# Patient Record
Sex: Female | Born: 1980 | Race: White | Hispanic: No | Marital: Married | Smoking: Never smoker
Health system: Southern US, Community
[De-identification: ages and names within clinical notes are randomized; demographics above are authoritative.]

## PROBLEM LIST (undated history)

## (undated) ENCOUNTER — Inpatient Hospital Stay (HOSPITAL_COMMUNITY): Payer: Self-pay

## (undated) DIAGNOSIS — T7840XA Allergy, unspecified, initial encounter: Secondary | ICD-10-CM

## (undated) HISTORY — PX: SKIN CANCER EXCISION: SHX779

## (undated) HISTORY — DX: Allergy, unspecified, initial encounter: T78.40XA

---

## 2002-03-04 ENCOUNTER — Other Ambulatory Visit: Admission: RE | Admit: 2002-03-04 | Discharge: 2002-03-04 | Payer: Self-pay | Admitting: Obstetrics and Gynecology

## 2002-07-04 ENCOUNTER — Encounter: Payer: Self-pay | Admitting: Obstetrics and Gynecology

## 2002-07-04 ENCOUNTER — Ambulatory Visit (HOSPITAL_COMMUNITY): Admission: RE | Admit: 2002-07-04 | Discharge: 2002-07-04 | Payer: Self-pay | Admitting: Obstetrics and Gynecology

## 2002-08-21 ENCOUNTER — Other Ambulatory Visit: Admission: RE | Admit: 2002-08-21 | Discharge: 2002-08-21 | Payer: Self-pay | Admitting: Obstetrics and Gynecology

## 2002-11-21 ENCOUNTER — Inpatient Hospital Stay (HOSPITAL_COMMUNITY): Admission: AD | Admit: 2002-11-21 | Discharge: 2002-11-24 | Payer: Self-pay | Admitting: Obstetrics and Gynecology

## 2003-12-27 DIAGNOSIS — C4370 Malignant melanoma of unspecified lower limb, including hip: Secondary | ICD-10-CM | POA: Insufficient documentation

## 2004-01-15 ENCOUNTER — Other Ambulatory Visit: Admission: RE | Admit: 2004-01-15 | Discharge: 2004-01-15 | Payer: Self-pay | Admitting: Obstetrics and Gynecology

## 2004-08-20 ENCOUNTER — Other Ambulatory Visit: Admission: RE | Admit: 2004-08-20 | Discharge: 2004-08-20 | Payer: Self-pay | Admitting: Obstetrics and Gynecology

## 2004-11-24 ENCOUNTER — Ambulatory Visit (HOSPITAL_BASED_OUTPATIENT_CLINIC_OR_DEPARTMENT_OTHER): Admission: RE | Admit: 2004-11-24 | Discharge: 2004-11-24 | Payer: Self-pay | Admitting: General Surgery

## 2004-11-24 ENCOUNTER — Encounter (INDEPENDENT_AMBULATORY_CARE_PROVIDER_SITE_OTHER): Payer: Self-pay | Admitting: *Deleted

## 2004-11-24 ENCOUNTER — Ambulatory Visit (HOSPITAL_COMMUNITY): Admission: RE | Admit: 2004-11-24 | Discharge: 2004-11-24 | Payer: Self-pay | Admitting: General Surgery

## 2005-02-15 ENCOUNTER — Other Ambulatory Visit: Admission: RE | Admit: 2005-02-15 | Discharge: 2005-02-15 | Payer: Self-pay | Admitting: Obstetrics and Gynecology

## 2005-06-23 ENCOUNTER — Encounter: Admission: RE | Admit: 2005-06-23 | Discharge: 2005-08-24 | Payer: Self-pay | Admitting: Obstetrics and Gynecology

## 2005-08-18 ENCOUNTER — Ambulatory Visit: Payer: Self-pay | Admitting: *Deleted

## 2005-08-23 ENCOUNTER — Inpatient Hospital Stay (HOSPITAL_COMMUNITY): Admission: AD | Admit: 2005-08-23 | Discharge: 2005-08-26 | Payer: Self-pay | Admitting: Obstetrics and Gynecology

## 2005-10-21 ENCOUNTER — Emergency Department (HOSPITAL_COMMUNITY): Admission: EM | Admit: 2005-10-21 | Discharge: 2005-10-21 | Payer: Self-pay | Admitting: Family Medicine

## 2006-05-25 ENCOUNTER — Other Ambulatory Visit: Admission: RE | Admit: 2006-05-25 | Discharge: 2006-05-25 | Payer: Self-pay | Admitting: Obstetrics and Gynecology

## 2007-05-27 LAB — CONVERTED CEMR LAB: Pap Smear: NORMAL

## 2007-12-24 ENCOUNTER — Inpatient Hospital Stay (HOSPITAL_COMMUNITY): Admission: AD | Admit: 2007-12-24 | Discharge: 2007-12-24 | Payer: Self-pay | Admitting: Obstetrics and Gynecology

## 2008-01-10 ENCOUNTER — Emergency Department (HOSPITAL_COMMUNITY): Admission: EM | Admit: 2008-01-10 | Discharge: 2008-01-10 | Payer: Self-pay | Admitting: Family Medicine

## 2008-05-01 ENCOUNTER — Inpatient Hospital Stay (HOSPITAL_COMMUNITY): Admission: AD | Admit: 2008-05-01 | Discharge: 2008-05-03 | Payer: Self-pay | Admitting: Obstetrics and Gynecology

## 2008-05-27 ENCOUNTER — Ambulatory Visit: Payer: Self-pay | Admitting: Family Medicine

## 2008-05-27 DIAGNOSIS — G43009 Migraine without aura, not intractable, without status migrainosus: Secondary | ICD-10-CM | POA: Insufficient documentation

## 2008-05-27 DIAGNOSIS — Z8742 Personal history of other diseases of the female genital tract: Secondary | ICD-10-CM

## 2008-05-27 DIAGNOSIS — J309 Allergic rhinitis, unspecified: Secondary | ICD-10-CM | POA: Insufficient documentation

## 2008-06-09 ENCOUNTER — Ambulatory Visit (HOSPITAL_COMMUNITY): Admission: RE | Admit: 2008-06-09 | Discharge: 2008-06-09 | Payer: Self-pay | Admitting: Family Medicine

## 2008-07-01 ENCOUNTER — Encounter (INDEPENDENT_AMBULATORY_CARE_PROVIDER_SITE_OTHER): Payer: Self-pay | Admitting: *Deleted

## 2008-10-21 ENCOUNTER — Ambulatory Visit: Payer: Self-pay | Admitting: Family Medicine

## 2008-10-21 LAB — CONVERTED CEMR LAB: Rapid Strep: NEGATIVE

## 2008-10-27 ENCOUNTER — Ambulatory Visit: Payer: Self-pay | Admitting: Family Medicine

## 2009-01-12 ENCOUNTER — Ambulatory Visit: Payer: Self-pay | Admitting: Family Medicine

## 2009-03-11 ENCOUNTER — Ambulatory Visit: Payer: Self-pay | Admitting: Family Medicine

## 2009-11-25 LAB — CONVERTED CEMR LAB
Pap Smear: NORMAL
Pap Smear: NORMAL
Pap Smear: NORMAL
Pap Smear: NORMAL
Pap Smear: NORMAL
Pap Smear: NORMAL

## 2010-09-07 ENCOUNTER — Telehealth (INDEPENDENT_AMBULATORY_CARE_PROVIDER_SITE_OTHER): Payer: Self-pay | Admitting: *Deleted

## 2010-09-08 ENCOUNTER — Ambulatory Visit: Payer: Self-pay | Admitting: Family Medicine

## 2010-09-08 LAB — CONVERTED CEMR LAB
ALT: 32 units/L (ref 0–35)
AST: 25 units/L (ref 0–37)
Albumin: 4.2 g/dL (ref 3.5–5.2)
Alkaline Phosphatase: 43 units/L (ref 39–117)
BUN: 12 mg/dL (ref 6–23)
Bilirubin, Direct: 0.1 mg/dL (ref 0.0–0.3)
CO2: 30 meq/L (ref 19–32)
Calcium: 9.7 mg/dL (ref 8.4–10.5)
Chloride: 107 meq/L (ref 96–112)
Cholesterol: 152 mg/dL (ref 0–200)
Creatinine, Ser: 0.8 mg/dL (ref 0.4–1.2)
GFR calc non Af Amer: 95.14 mL/min (ref >60)
Glucose, Bld: 88 mg/dL (ref 70–99)
HDL: 47.1 mg/dL (ref >39.00)
LDL Cholesterol: 92 mg/dL (ref 0–99)
Potassium: 4.3 meq/L (ref 3.5–5.1)
Sodium: 141 meq/L (ref 135–145)
Total Bilirubin: 0.8 mg/dL (ref 0.3–1.2)
Total CHOL/HDL Ratio: 3
Total Protein: 7.1 g/dL (ref 6.0–8.3)
Triglycerides: 66 mg/dL (ref 0.0–149.0)
VLDL: 13.2 mg/dL (ref 0.0–40.0)

## 2010-09-14 ENCOUNTER — Ambulatory Visit: Payer: Self-pay | Admitting: Family Medicine

## 2010-09-14 ENCOUNTER — Encounter: Payer: Self-pay | Admitting: Family Medicine

## 2010-09-14 DIAGNOSIS — G5600 Carpal tunnel syndrome, unspecified upper limb: Secondary | ICD-10-CM

## 2010-09-14 DIAGNOSIS — M6740 Ganglion, unspecified site: Secondary | ICD-10-CM | POA: Insufficient documentation

## 2010-10-20 ENCOUNTER — Encounter (INDEPENDENT_AMBULATORY_CARE_PROVIDER_SITE_OTHER): Payer: Self-pay | Admitting: Obstetrics and Gynecology

## 2010-10-20 ENCOUNTER — Ambulatory Visit: Payer: Self-pay | Admitting: Vascular Surgery

## 2010-10-20 ENCOUNTER — Ambulatory Visit: Admission: RE | Admit: 2010-10-20 | Discharge: 2010-10-20 | Payer: Self-pay | Admitting: Obstetrics and Gynecology

## 2011-01-25 NOTE — Progress Notes (Signed)
----   Converted from flag ---- ---- 09/07/2010 9:55 AM, Kerby Nora MD wrote: CMEt, lipoids Dx v77.91  ---- 09/06/2010 10:56 AM, Liane Comber CMA (AAMA) wrote: Lab orders please! Good Morning! This pt is scheduled for cpx labs Wed, which labs to draw and dx codes to use? Thanks Tasha ------------------------------

## 2011-01-25 NOTE — Assessment & Plan Note (Signed)
Summary: cpx/alc   Vital Signs:  Patient profile:   30 year old female Height:      64 inches Weight:      137.0 pounds BMI:     23.60 Temp:     98.8 degrees F oral Pulse rate:   72 / minute Pulse rhythm:   regular BP sitting:   106 / 72  (left arm) Cuff size:   regular  Vitals Entered By: Benny Lennert CMA Duncan Dull) (September 14, 2010 11:34 AM)  History of Present Illness: Chief complaint chronic health issue maintanance  Sees GYN for breast and pelvic exam.    Continued headache. Occuring 4-5 days a week.  Nausea ., light sensitivity.  Has to lay down.  Worsening in past month and a half.  Pain, pressure, squeezing pain, on right side..blurred in right eye. Using 600 mg usually 1-2 times a day.   No clear known trigger. No caffeine intake.   MRI nml 2009.   Ganglion cyst left posterior wrist...occ weakness in left hand. Limited bending. Intermittant pain. Though may have carpal tunnel in past but never treatmed/.  Has history of fracture left radius when young.  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 Take 1 Tablet By Mouth Once A Day 2)  Ibuprofen 600 Mg Tabs (Ibuprofen) .... As Needed  Allergies: 1)  ! Vicks Dayquil Cough (Dextromethorphan Hbr)  Past History:  Past medical, surgical, family and social histories (including risk factors) reviewed, and no changes noted (except as noted below).  Past Medical History: Reviewed history from 05/27/2008 and no changes required. Asthma, childhood Allergic rhinitis  Past Surgical  History: Reviewed history from 05/27/2008 and no changes required. 2005 removal of melanoma, stage 1 NSVD x 3  Family History: Reviewed history from 05/27/2008 and no changes required. father: fibromyalgia, autoimmune disease mother: healthy siblings: healthy PGM: heart disease, DM, MVP MGM: stomache cancer, DM PGF: alcoholic, lung cancer  Social History: Reviewed history from 05/27/2008 and no changes required. 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 CV:  Denies chest pain or discomfort. Resp:  Complains of shortness of breath; denies cough, sputum productive, and wheezing; Some SOB... worse during allergy season, worse at night. ..uses zyrtec for allergies. uses kids albuterol...about one a night.  Has history of asthma as child. Marland Kitchen GI:  Denies abdominal pain. GU:  Denies dysuria. Psych:  Denies anxiety and depression; a lot of stress.  Physical Exam  General:  Well-developed,well-nourished,in no acute distress; alert,appropriate and cooperative throughout examination, nontoxic. Head:  Normocephalic and atraumatic without obvious abnormalities. No apparent alopecia or balding. Eyes:  No corneal or conjunctival inflammation noted. EOMI. Perrla. Funduscopic exam benign, without hemorrhages, exudates or papilledema. Vision grossly normal. Ears:  External ear exam shows no significant lesions or deformities.  Otoscopic examination reveals clear canals, tympanic membranes are intact bilaterally without bulging, retraction, inflammation or discharge. Hearing is grossly normal bilaterally. Nose:  External nasal examination shows no deformity or inflammation. Nasal mucosa are  pink and moist without lesions or exudates. Mouth:  Some redness and inflammation. 3+ tonsils with crypts...discussed. Neck:  no carotid bruit or thyromegaly no cervical or  supraclavicular lymphadenopathy  Breasts:  per GYN Lungs:  Normal respiratory effort, chest expands symmetrically. Lungs are clear to auscultation, no crackles or wheezes. Heart:  Normal rate and regular rhythm. S1 and S2 normal without gallop, murmur, click, rub or other extra sounds. Abdomen:  Bowel sounds positive,abdomen soft and non-tender without masses, organomegaly or hernias noted. Genitalia:  per gYN Msk:  ganglion cyst on left dorsal wrist ttp in radial half on hand and numbness triggered by Phalen's and Tinel's  Pulses:  R and L posterior tibial pulses are full and equal bilaterally  Extremities:  no edema  Neurologic:  No cranial nerve deficits noted. Station and gait are normal. DTRs are symmetrical throughout. Sensory, motor and coordinative functions appear intact. Skin:  Intact without suspicious lesions or rashes Psych:  Cognition and judgment appear intact. Alert and cooperative with normal attention span and concentration. No apparent delusions, illusions, hallucinations   Impression & Recommendations:  Problem # 1:  MIGRAINE, COMMON (ICD-346.10) Stop OTC meds.  Eliminate caffeine. Increase exercsie regualrly. Sleep 8 hours at night.  Look at migraine triggers...keep headache diary. bring to next appt.  Use sumatriptan for severe migraine.  The following medications were removed from the medication list:    Diclofenac Sodium 75 Mg Tbec (Diclofenac sodium) .Marland Kitchen... 1 tab by mouth two times a day only as needed Her updated medication list for this problem includes:    Sumatriptan Succinate 100 Mg Tabs (Sumatriptan succinate) .Marland Kitchen... 1 tab by mouth x 1 then may repeat in 2 hours if not resolved.  max 200 mg in 24 hours.  Problem # 2:  ? of ASTHMA (ICD-493.90) Normal spirometry..? due more to allergies being poor control...start singulair.  Follow up i 1 month.  Her updated medication list for this problem includes:    Singulair 10 Mg Tabs (Montelukast sodium) .Marland Kitchen... 1 tab  by mouth daily  Orders: Spirometry w/Graph (94010)  Problem # 3:  ALLERGIC RHINITIS (ICD-477.9) Treat as in #2  Problem # 4:  CARPAL TUNNEL SYNDROME, LEFT (ICD-354.0) NSAIDs, wear brace B. Info given. Orders: Splints- All Types (Z6109)  Problem # 5:  GANGLION CYST, WRIST, LEFT (ICD-727.41) Pt reassured. Follow.  Complete Medication List: 1)  Multivitamins Tabs (Multiple vitamin) .... Take 1 tablet by mouth once a day 2)  Singulair 10 Mg Tabs (Montelukast sodium) .Marland Kitchen.. 1 tab by mouth daily 3)  Sumatriptan Succinate 100 Mg Tabs (Sumatriptan succinate) .Marland Kitchen.. 1 tab by mouth x 1 then may repeat in 2 hours if not resolved.  max 200 mg in 24 hours.  Patient Instructions: 1)  Stop OTC meds. 2)   Eliminate caffeine. 3)  Increase exercsie regualrly. 4)  Sleep 8 hours at night. 5)   Look at migraine triggers...keep headache diary. bring to next appt.  6)  Use sumatriptan for severe migraine.  7)  Start singulair for allergies/asthma. 8)  Limit albuterol use. 9)  Please schedule a follow-up appointment in 1 month 30 min appt. 10)  Start wearing carpal tunnel brace on left wrist during the day.  Prescriptions: SUMATRIPTAN SUCCINATE 100 MG TABS (SUMATRIPTAN SUCCINATE) 1 tab by mouth x 1 then may repeat in 2 hours if not resolved.  MAx 200 mg in 24 hours.  #9 x 0   Entered and Authorized by:   Kerby Nora MD   Signed by:  Kerby Nora MD on 09/14/2010   Method used:   [facility] to        [facility] [id number] (retail[facility]       [address]       [place], [place]  [address]       Ph: [phone] or [phone]       Fax: [phone]   RxID:   [id number] SINGULAIR 10 MG TABS (MONTELUKAST SODIUM) 1 tab by mouth daily  #30 x 5   Entered and Authorized by:   Kerby Nora MD   Signed by:   Kerby Nora MD on 09/14/2010   Method used:   [facility]        [facility]  [address] (retail)       [address]       [place], [place]  [address]       Ph: [phone]  or [phone]       Fax: [phone]   RxID:   [id number]   Current Allergies (reviewed today): ! VICKS DAYQUIL COUGH (DEXTROMETHORPHAN HBR) Flu Vaccine Result Date:  09/14/2010 Flu Vaccine Result:  will get at work Flu Vaccine Next Due:  1 yr Last PAP:  Normal (05/27/2007 10:31:54 AM) PAP Result Date:  11/25/2009 PAP Result:  normal PAP Next Due:  1 yr

## 2011-02-16 ENCOUNTER — Ambulatory Visit: Payer: Self-pay | Admitting: Family Medicine

## 2011-05-10 NOTE — Discharge Summary (Signed)
NAMESHIZUE, KASEMAN                ACCOUNT NO.:  [id number]   MEDICAL RECORD NO.:  [id number]          PATIENT TYPE:  INP   LOCATION:  9111                          FACILITY:  [facility]   PHYSICIAN:  Malachi Pro. Ambrose Mantle, M.D. DATE OF BIRTH:  Mar 02, 1981   DATE OF ADMISSION:  05/01/2008  DATE OF DISCHARGE:  05/03/2008                               DISCHARGE SUMMARY   A 30 year old white female, para 2-0-1-2, gravida 4, 38 plus weeks by  last period compatible with an 8-week ultrasound with [facility] May 13, 2008,  presented to the office complaining of irregular contractions, increased  pressure, good fetal movement, and no ruptured membranes.  Cervix there  was 3 cm, 60%.  Blood group and type A positive, negative antibody, RPR  nonreactive, rubella immune, hepatitis B surface antigen negative, HIV  negative, GC and Chlamydia negative, cystic fibrosis negative, and  Glucola elevated x2.  A 3-hour GTT normal x2.  Group B strep positive.  Prenatal care, reflux treated with over-the-counter Zantac, otherwise  essentially uncomplicated.   OBSTETRICAL HISTORY:  Spontaneous vaginal delivery at term x2, 7 pounds  6 ounces and 7 pounds 13 ounces, history of gestational diabetes  mellitus.  She also had a spontaneous abortion.   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   PAST MEDICAL HISTORY:  Mild asthma.   SURGICAL HISTORY:  Melanoma of the right calf.   ALLERGIES:  VICKS causes a rash.   MEDICATIONS:  None.   The patient on admission was afebrile with normal vital signs.  HEART/LUNGS:  Normal.  ABDOMEN:  Soft and gravid.  Cervix 3 cm, 60%, vertex at -2.   The patient was started on penicillin for group B strep and Pitocin.  Artificial rupture of membranes was done for augmentation.  At 5:50  p.m., the cervix was 8 cm, 90%.  She progressed to complete dilatation,  pushed well, and delivered a 7 pounds 7 ounces female infant without  Apgars of 9 at 1 and 9 at 5 minutes by Dr. Jackelyn Knife.   Over an intact  perineum, a vaginal cyst at 10-11 o'clock was drained.  Blood loss was  less than 500 mL.  Postpartum, the patient did well, discharged on the  second postpartum day.  Initial hemoglobin was 12.1, hematocrit 35,  white count 15,000, and platelet count 177,000.  RPR was nonreactive.   FINAL DIAGNOSES:  Intrauterine pregnancy at 38 plus weeks, delivered  vertex.   OPERATION:  Spontaneous delivery vertex.   FINAL CONDITION:  Improved.   INSTRUCTIONS:  Include a regular discharge instruction booklet.  Percocet 5/325, 30 tablets 1 every 4-6 hours as needed for pain and  Motrin 600 mg 30 tablets 1 every 6 hours as needed for pain are given at  discharge.      Malachi Pro. Ambrose Mantle, M.D.  Electronically Signed     TFH/MEDQ  D:  05/03/2008  T:  05/03/2008  Job:  [id number]

## 2011-05-13 NOTE — Discharge Summary (Signed)
NAME:  Crystal [name], Crystal [name]                          ACCOUNT NO.:  [id number]   MEDICAL RECORD NO.:  [id number]                   PATIENT TYPE:  INP   LOCATION:  9121                                 [facility]ACILITY:  [facility]   PHYSICIAN:  Huel Cote, M.D.              DATE OF BIRTH:  [birth year]   DATE OF ADMISSION:  11/21/2002  DATE OF DISCHARGE:  11/24/2002                                 DISCHARGE SUMMARY   DISCHARGE DIAGNOSES:  1. Term pregnancy at 38 weeks delivered.  2. Status post normal spontaneous vaginal delivery.   DISCHARGE MEDICATIONS:  1. Motrin 600 mg p.o. q.6h. p.r.n.  2. Percocet one to two tablets p.o. q.4h. p.r.n.   DISCHARGE FOLLOW-UP:  The patient is to follow up at approximately six weeks  for her routine postpartum exam.   HOSPITAL COURSE:  The patient is a 30 year old G2 P0-0-1-0 who was admitted  at 20 weeks with a complaint of ruptured membranes.  She was having only  irregular contractions and cervical exam on admission was 1, 70, and a -2.  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   PRENATAL LABORATORY DATA:  A positive, antibody negative.  RPR nonreactive.  Rubella immune.  Hepatitis B surface antigen negative.  GC negative,  chlamydia negative.  Triple screen normal.  One-hour Glucola 117.  Group B  strep negative.   OBSTETRICAL HISTORY:  History of miscarriage.   GYNECOLOIGCAL HISTORY:  The CIN 1 by colposcopy.   PAST MEDICAL HISTORY:  History of asthma for which she uses an inhaler  periodically.   PAST SURGICAL HISTORY:  None.   ALLERGIES:  VICKS OVER-THE-COUNTER MEDICINE.   MEDICATIONS:  None.   HOSPITAL COURSE:  On admission she was afebrile with stable vital signs.  Fetal heart rate was reactive.  Cervix was, as stated, 1, 70, and a -2.  Estimated fetal weight was 7.5 pounds.  The patient was eventually placed on  IV  Pitocin when she did not begin to labor spontaneously and received IV  Stadol.  An attempt was made at an epidural; however, this was not ever  successful.  She reached complete dilation and pushed well with a normal  spontaneous vaginal delivery of a viable female infant over a first degree  vaginal laceration.  Apgars were 7 and 9, weight was 7 pounds 11 ounces.  There was a nuchal cord x1 which was reduced.  Her first degree and right  labial lacerations  were repaired with 3-0 Vicryl with a local block.  She did very well  postpartum and on postpartum day #2 was afebrile with stable vital signs.  Her lochia was normal and her fundus was firm.  She was therefore felt  stable for discharge home and was discharged to follow up as previously  stated in six weeks.                                               Huel Cote, M.D.    KR/MEDQ  D:  11/24/2002  T:  11/24/2002  Job:  [id number]

## 2011-05-13 NOTE — Op Note (Signed)
NAMEPRESLEY, GORA                ACCOUNT NO.:  [id number]   MEDICAL RECORD NO.:  [id number]          PATIENT TYPE:  AMB   LOCATION:  DSC                          FACILITY:  [facility]   PHYSICIAN:  Rose Phi. Maple Hudson, M.D.   DATE OF BIRTH:  27-Apr-1981   DATE OF PROCEDURE:  11/24/2004  DATE OF DISCHARGE:                                 OPERATIVE REPORT   PREOPERATIVE DIAGNOSIS:  T1, A melanoma of the right lower leg.   POSTOPERATIVE DIAGNOSIS:  T1, A melanoma of the right lower leg.   OPERATION PERFORMED:  Excision of same with primary closure.   SURGEON:  Rose Phi. Maple Hudson, M.D.   ANESTHESIA:  Local.   DESCRIPTION OF PROCEDURE:  The patient was placed in the prone position and  the right lower leg prepped and draped in the usual fashion.  An elliptical  longitudinally oriented ellipse of the biopsy site was then outlined.  The  area was then thoroughly infiltrated with 1% Xylocaine with Adrenalin. An  excision then with a 5 to 6 mm margin around the biopsy site was outlined  and the incision was made and this was excised.  Had good hemostasis.  This  gave a 3 x 1 cm defect.  It was then closed with a single layer of  interrupted 4-0 nylon sutures.  Dressing applied.  The patient was then  allowed to go home.      Pete   PRY/MEDQ  D:  11/24/2004  T:  11/24/2004  Job:  [id number]

## 2011-05-13 NOTE — Discharge Summary (Signed)
Crystal [name], Crystal [name]                ACCOUNT NO.:  [id number]   MEDICAL RECORD NO.:  [id number]          PATIENT TYPE:  INP   LOCATION:  9134                          [facility]   PHYSICIAN:  Huel Cote, M.D. DATE OF BIRTH:  December 14, 1981   DATE OF ADMISSION:  08/23/2005  DATE OF DISCHARGE:                                 DISCHARGE SUMMARY   DISCHARGE DIAGNOSES:  1.  Term pregnancy at 38+ weeks, delivered.  2.  Status post normal spontaneous vaginal delivery.   DISCHARGE MEDICATIONS:  1.  Motrin 600 mg p.o. q.6h.  2.  Percocet one to two tablets p.o. q.4h. p.r.n.   DISCHARGE FOLLOWUP:  The patient is to follow up in 6 weeks for her routine  postpartum exam.   HOSPITAL COURSE:  The patient is a 30 year old G3 P1-0-1-1 who was admitted  at 38+ weeks gestation for elective induction. She had pregnancy which was  complicated by A1 diabetes with good control; otherwise, was uneventful.  Prenatal labs were as follows:  A positive, antibody negative, RPR  nonreactive, rubella immune, hepatitis B surface antigen negative, GC  negative, chlamydia negative. One-hour Glucola 78; 3-hour Glucola 81, 207,  175, 136. Group B strep was negative. Past obstetrical history:  She has had  one spontaneous miscarriage and one vaginal delivery at 38 weeks of a 7-  pound 11-ounce infant. Past GYN history:  History of CIN-1 with cryo. Past  medical history:  Remote history of asthma. Past surgical history:  Melanoma  removed from one leg. She has no known drug allergies. On admission was 3-4,  70%, and -1 station. She had rupture of membranes performed with clear fluid  noted. She was placed on Pitocin and progressed well with normal spontaneous  vaginal delivery of a vigorous female infant, Apgars 9 and 9. Weight was 7  pounds 13 ounces. She had a nuchal cord x1, local block was placed at the  perineum. The placenta delivered spontaneously. Perineum had the hemostatic  abrasions, therefore did not  require repair. On postpartum day #2 she was  felt to be doing very well. She was afebrile with stable vital signs, her  fundus was firm, and she was felt stable for discharge home. Therefore, she  was discharged home on the medications as previously stated.      Huel Cote, M.D.  Electronically Signed     KR/MEDQ  D:  08/26/2005  T:  08/26/2005  Job:  [id number]

## 2011-09-06 ENCOUNTER — Other Ambulatory Visit (HOSPITAL_COMMUNITY): Payer: Self-pay | Admitting: Orthopedic Surgery

## 2011-09-06 DIAGNOSIS — M25561 Pain in right knee: Secondary | ICD-10-CM

## 2011-09-13 ENCOUNTER — Inpatient Hospital Stay (HOSPITAL_COMMUNITY): Admission: RE | Admit: 2011-09-13 | Payer: Self-pay | Source: Ambulatory Visit

## 2011-09-21 LAB — CBC
HCT: 35 — ABNORMAL LOW
Hemoglobin: 12.1
RBC: 3.67 — ABNORMAL LOW
WBC: 15 — ABNORMAL HIGH

## 2011-09-30 LAB — URINALYSIS, ROUTINE W REFLEX MICROSCOPIC
Glucose, UA: NEGATIVE
Specific Gravity, Urine: <1.005 — ABNORMAL LOW
Urobilinogen, UA: 0.2
pH: 7

## 2011-09-30 LAB — URINE MICROSCOPIC-ADD ON

## 2011-11-30 ENCOUNTER — Encounter: Payer: Self-pay | Admitting: Family Medicine

## 2011-12-01 ENCOUNTER — Encounter: Payer: Self-pay | Admitting: Family Medicine

## 2011-12-01 ENCOUNTER — Ambulatory Visit (INDEPENDENT_AMBULATORY_CARE_PROVIDER_SITE_OTHER): Payer: Self-pay | Admitting: Family Medicine

## 2011-12-01 VITALS — BP 90/60 | HR 92 | Temp 98.4°F | Ht 64.0 in | Wt 147.8 lb

## 2011-12-01 DIAGNOSIS — J029 Acute pharyngitis, unspecified: Secondary | ICD-10-CM | POA: Insufficient documentation

## 2011-12-01 MED ORDER — MAGIC MOUTHWASH W/LIDOCAINE: 5.0000 mL | Freq: Four times a day (QID) | ORAL | Status: DC | PRN

## 2011-12-01 MED ORDER — AMOXICILLIN 500 MG PO CAPS: 1000.0000 mg | ORAL_CAPSULE | Freq: Two times a day (BID) | ORAL | Status: AC

## 2011-12-01 NOTE — Assessment & Plan Note (Signed)
Hx of chronic cryptic tonsilittis.. Today will treat with antibitoics given swelling or left tonsil greater than right and exudate.  if not improving.. Follow up with ENT.

## 2011-12-01 NOTE — Patient Instructions (Signed)
Start antibiotics, use magic mouthwash for symptoms prn. Call if not improving as expected or return to ENT. Go to ER for difficulty breathing or swallowing.

## 2011-12-01 NOTE — Progress Notes (Signed)
  Subjective:    Patient ID: Crystal [name], female    DOB: 12/18/1981, 30 y.o.   MRN: [id number]  Sore Throat  This is a new problem. The current episode started in the past 7 days. The problem has been gradually worsening. The pain is worse on the left (told by ENT that she has chronic cryptic tonsilittis, recommended tonsilectomy) side. There has been no fever. The pain is moderate. Pertinent negatives include no abdominal pain, ear discharge, ear pain, hoarse voice, plugged ear sensation, shortness of breath or trouble swallowing. Associated symptoms comments: Mild nasal congestion. She has had exposure to strep. She has had no exposure to mono. Exposure to: 30 year old with strep last week. She has tried NSAIDs for the symptoms. The treatment provided mild relief.      Review of Systems  Constitutional: Negative for fever and fatigue.  HENT: Negative for ear pain, hoarse voice, trouble swallowing and ear discharge.   Eyes: Negative for pain.  Respiratory: Negative for chest tightness and shortness of breath.   Cardiovascular: Negative for chest pain, palpitations and leg swelling.  Gastrointestinal: Negative for abdominal pain.  Genitourinary: Negative for dysuria.       Objective:   Physical Exam  Constitutional: Vital signs are normal. She appears well-developed and well-nourished. She is cooperative.  Non-toxic appearance. She does not appear ill. No distress.  HENT:  Head: Normocephalic.  Right Ear: Hearing, tympanic membrane, external ear and ear canal normal. Tympanic membrane is not erythematous, not retracted and not bulging.  Left Ear: Hearing, tympanic membrane, external ear and ear canal normal. Tympanic membrane is not erythematous, not retracted and not bulging.  Nose: Mucosal edema and rhinorrhea present. Right sinus exhibits no maxillary sinus tenderness and no frontal sinus tenderness. Left sinus exhibits no maxillary sinus tenderness and no frontal sinus tenderness.    Mouth/Throat: Uvula is midline and mucous membranes are normal. No dental abscesses, uvula swelling or dental caries. Oropharyngeal exudate, posterior oropharyngeal edema and posterior oropharyngeal erythema present.       Left tonsil swollen more than right .Marland Kitchen Exudate on tonsils and food in large crypts.  Eyes: Conjunctivae, EOM and lids are normal. Pupils are equal, round, and reactive to light. No foreign bodies found.  Neck: Trachea normal and normal range of motion. Neck supple. Carotid bruit is not present. No mass and no thyromegaly present.  Cardiovascular: Normal rate, regular rhythm, S1 normal, S2 normal, normal heart sounds, intact distal pulses and normal pulses.  Exam reveals no gallop and no friction rub.   No murmur heard. Pulmonary/Chest: Effort normal and breath sounds normal. Not tachypneic. No respiratory distress. She has no decreased breath sounds. She has no wheezes. She has no rhonchi. She has no rales.  Genitourinary: Vagina normal and uterus normal.  Neurological: She is alert.  Skin: Skin is warm, dry and intact. No rash noted.  Psychiatric: Her speech is normal and behavior is normal. Judgment normal. Her mood appears not anxious. Cognition and memory are normal. She does not exhibit a depressed mood.          Assessment & Plan:

## 2013-03-25 ENCOUNTER — Ambulatory Visit (INDEPENDENT_AMBULATORY_CARE_PROVIDER_SITE_OTHER): Payer: 59 | Admitting: Family Medicine

## 2013-03-25 ENCOUNTER — Encounter: Payer: Self-pay | Admitting: Family Medicine

## 2013-03-25 VITALS — BP 100/66 | HR 67 | Temp 98.1°F | Wt 147.0 lb

## 2013-03-25 DIAGNOSIS — R1011 Right upper quadrant pain: Secondary | ICD-10-CM

## 2013-03-25 DIAGNOSIS — R1013 Epigastric pain: Secondary | ICD-10-CM

## 2013-03-25 LAB — HEPATIC FUNCTION PANEL
ALT: 48 U/L — ABNORMAL HIGH (ref 0–35)
AST: 32 U/L (ref 0–37)
Albumin: 4.1 g/dL (ref 3.5–5.2)
Alkaline Phosphatase: 58 U/L (ref 39–117)
Bilirubin, Direct: 0.1 mg/dL (ref 0.0–0.3)
Total Bilirubin: 0.7 mg/dL (ref 0.3–1.2)
Total Protein: 7.1 g/dL (ref 6.0–8.3)

## 2013-03-25 LAB — CBC WITH DIFFERENTIAL/PLATELET
Basophils Absolute: 0 10*3/uL (ref 0.0–0.1)
Hemoglobin: 12.9 g/dL (ref 12.0–15.0)
Lymphocytes Relative: 28.6 % (ref 12.0–46.0)
Monocytes Relative: 7.1 % (ref 3.0–12.0)
Neutro Abs: 3.2 10*3/uL (ref 1.4–7.7)
Neutrophils Relative %: 61.7 % (ref 43.0–77.0)
RBC: 4.1 Mil/uL (ref 3.87–5.11)
RDW: 12.7 % (ref 11.5–14.6)

## 2013-03-25 LAB — H. PYLORI ANTIBODY, IGG: H Pylori IgG: NEGATIVE

## 2013-03-25 LAB — LIPASE: Lipase: 34 U/L (ref 11.0–59.0)

## 2013-03-25 NOTE — Patient Instructions (Addendum)
REFERRAL: GO THE THE FRONT ROOM AT THE ENTRANCE OF OUR CLINIC, NEAR CHECK IN. ASK FOR MARION. SHE WILL HELP YOU SET UP YOUR REFERRAL. DATE: TIME:  

## 2013-03-25 NOTE — Progress Notes (Signed)
Nature conservation officer at [facility] [address] [place] [address] Phone: [phone] Fax: [phone]  Date:  03/25/2013   Name:  Crystal [name]   DOB:  09-12-1981   MRN:  [id number] Gender: female Age: 32 y.o.  Primary Physician:  Kerby Nora, MD  Evaluating MD: Hannah Beat, MD   Chief Complaint: Abdominal Pain   History of Present Illness:  Crystal [name] is a 32 y.o. pleasant patient who presents with the following:  Off and on tightness in chest and more pressure and burning in the chest. Has had more nausea and more reflux, started to take Prilosec last Friday. Then got super hot, cougld not catch breath, more burning and got better after about 25 minutes.   Sharp burning pain on 1 sided on the right upper quadrant and has been dull achy and all on the right side, nausea. No watery stool. She has also had pain in the epigastric region. No nausea. She has been unable to run.  LMP: 03/17/2013  Drank some citrus, gratefruit.    Patient Active Problem List  Diagnosis  . 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 MIGRAINE, COMMON  . CARPAL TUNNEL SYNDROME, LEFT  . ALLERGIC RHINITIS  . GANGLION CYST, WRIST, LEFT  . DIABETES MELLITUS, GESTATIONAL, HX OF  . Acute pharyngitis    Past Medical History  Diagnosis Date  . Allergy   . Asthma     Past Surgical History  Procedure Laterality Date  . Skin cancer excision      History   Social History  . Marital Status: Married    Spouse Name: N/A    Number of Children: N/A  . Years of Education: N/A   Occupational History  . Not on file.   Social History Main Topics  . Smoking status: Never Smoker   . Smokeless tobacco: Not on file  . Alcohol Use: No  . Drug Use: No  . Sexually Active: Not on file   Other Topics Concern  . Not on file   Social History Narrative   Regular exercise- yes    Family History  Problem Relation Age of Onset  . Fibromyalgia Father   . Autoimmune  disease Father   . Cancer Maternal Grandmother     stomach  . Diabetes Maternal Grandmother   . Diabetes Paternal Grandmother   . Heart disease Paternal Grandmother   . Alcohol abuse Paternal Grandfather   . Cancer Paternal Grandfather     lung    Allergies  Allergen Reactions  . Dextromethorphan Hbr Rash    Medication list has been reviewed and updated.  Outpatient Prescriptions Prior to Visit  Medication Sig Dispense Refill  . montelukast (SINGULAIR) 10 MG tablet Take 10 mg by mouth at bedtime.        . Multiple Vitamins-Minerals (MULTIVITAMIN PO) Take 1 tablet by mouth daily.       . SUMAtriptan (IMITREX) 100 MG tablet Take 100 mg by mouth every 2 (two) hours as needed.        . Alum & Mag Hydroxide-Simeth (MAGIC MOUTHWASH W/LIDOCAINE) SOLN Take 5 mLs by mouth 4 (four) times daily as needed (swish and gargel prn sore throat).  200 mL  0   No facility-administered medications prior to visit.    Review of Systems:  ROS: GEN: Acute illness details above GI: Tolerating PO intake GU: maintaining adequate hydration and urination Pulm: No SOB Interactive and getting along well at home.  Otherwise, ROS is as per the HPI.   Physical Examination: BP 100/66  Pulse 67  Temp(Src) 98.1 F (36.7 C) (Oral)  Wt 147 lb (66.679 kg)  BMI 25.22 kg/m2  SpO2 99%  Ideal Body Weight:    GEN: WDWN, NAD, Non-toxic, A & O x 3 HEENT: Atraumatic, Normocephalic. Neck supple. No masses, No LAD. Ears and Nose: No external deformity. CV: RRR, No M/G/R. No JVD. No thrill. No extra heart sounds. PULM: CTA B, no wheezes, crackles, rhonchi. No retractions. No resp. distress. No accessory muscle use. ABD: S, epigastric and right upper quadrant pain with + murphy's sign, ND, +BS. No rebound. No HSM. EXTR: No c/c/e NEURO Normal gait.  PSYCH: Normally interactive. Conversant. Not depressed or anxious appearing.  Calm demeanor.    Assessment and Plan:  Abdominal pain, right upper quadrant -  Plan: US Abdomen Complete, H. pylori antibody, IgG, CBC with Differential, Hepatic function panel, Lipase  Abdominal pain, epigastric - Plan: US Abdomen Complete, H. pylori antibody, IgG, CBC with Differential, Hepatic function panel, Lipase  Clinical concern for cholelithiasis vs ulcer and likely at least some GERD. Obtain an abdominal ultrasound to evaluate for gallstones, potential cholecystitis.   Appears stable for outpatient work-up.  Labs stable.  Results for orders placed in visit on 03/25/13  H. PYLORI ANTIBODY, IGG      Result Value Range   H Pylori IgG Negative  Negative  CBC WITH DIFFERENTIAL      Result Value Range   WBC 5.2  4.5 - 10.5 K/uL   RBC 4.10  3.87 - 5.11 Mil/uL   Hemoglobin 12.9  12.0 - 15.0 g/dL   HCT 47.8  29.5 - 62.1 %   MCV 92.8  78.0 - 100.0 fl   MCHC 34.0  30.0 - 36.0 g/dL   RDW 30.8  65.7 - 84.6 %   Platelets 224.0  150.0 - 400.0 K/uL   Neutrophils Relative 61.7  43.0 - 77.0 %   Lymphocytes Relative 28.6  12.0 - 46.0 %   Monocytes Relative 7.1  3.0 - 12.0 %   Eosinophils Relative 2.3  0.0 - 5.0 %   Basophils Relative 0.3  0.0 - 3.0 %   Neutro Abs 3.2  1.4 - 7.7 K/uL   Lymphs Abs 1.5  0.7 - 4.0 K/uL   Monocytes Absolute 0.4  0.1 - 1.0 K/uL   Eosinophils Absolute 0.1  0.0 - 0.7 K/uL   Basophils Absolute 0.0  0.0 - 0.1 K/uL  HEPATIC FUNCTION PANEL      Result Value Range   Total Bilirubin 0.7  0.3 - 1.2 mg/dL   Bilirubin, Direct 0.1  0.0 - 0.3 mg/dL   Alkaline Phosphatase 58  39 - 117 U/L   AST 32  0 - 37 U/L   ALT 48 (*) 0 - 35 U/L   Total Protein 7.1  6.0 - 8.3 g/dL   Albumin 4.1  3.5 - 5.2 g/dL  LIPASE      Result Value Range   Lipase 34.0  11.0 - 59.0 U/L     Orders Today:  Orders Placed This Encounter  Procedures  . US Abdomen Complete    Standing Status: Future     Number of Occurrences:      Standing Expiration Date: 05/25/2014    Order Specific Question:  Reason for exam:    Answer:  eval for gallstones    Order Specific  Question:  Preferred imaging location?    Answer:  [facility]  . H. pylori antibody, IgG  . CBC with Differential  . Hepatic function panel  . Lipase    Updated Medication List: (Includes new medications, updates to list, dose adjustments) No orders of the defined types were placed in this encounter.    Medications Discontinued: Medications Discontinued During This Encounter  Medication Reason  . Alum & Mag Hydroxide-Simeth (MAGIC MOUTHWASH W/LIDOCAINE) SOLN Completed Course      Signed, Karleen Hampshire T. Providence Stivers, MD 03/25/2013 11:49 AM

## 2013-03-27 ENCOUNTER — Ambulatory Visit (HOSPITAL_COMMUNITY)
Admission: RE | Admit: 2013-03-27 | Discharge: 2013-03-27 | Disposition: A | Discharge status: Home / Self Care | Payer: 59 | Source: Ambulatory Visit | Attending: Family Medicine | Admitting: Family Medicine

## 2013-03-27 DIAGNOSIS — K7689 Other specified diseases of liver: Secondary | ICD-10-CM | POA: Insufficient documentation

## 2013-03-27 DIAGNOSIS — R1013 Epigastric pain: Secondary | ICD-10-CM

## 2013-03-27 DIAGNOSIS — K8020 Calculus of gallbladder without cholecystitis without obstruction: Secondary | ICD-10-CM | POA: Insufficient documentation

## 2013-03-27 DIAGNOSIS — R1011 Right upper quadrant pain: Secondary | ICD-10-CM

## 2013-03-28 ENCOUNTER — Telehealth: Payer: Self-pay

## 2013-03-28 NOTE — Telephone Encounter (Signed)
discussed

## 2013-03-28 NOTE — Telephone Encounter (Signed)
Pt spoke with Dr Patsy Lager today about [place] report; pt had melanoma on her leg 9 years ago and was told if came back could effect an internal organ; pt wants to know if that would change how soon a MRI is done.Please advise.

## 2013-04-11 ENCOUNTER — Other Ambulatory Visit (INDEPENDENT_AMBULATORY_CARE_PROVIDER_SITE_OTHER): Payer: 59 | Admitting: Family Medicine

## 2013-04-11 ENCOUNTER — Telehealth: Payer: Self-pay

## 2013-04-11 ENCOUNTER — Other Ambulatory Visit: Payer: 59 | Admitting: *Deleted

## 2013-04-11 DIAGNOSIS — R9389 Abnormal findings on diagnostic imaging of other specified body structures: Secondary | ICD-10-CM

## 2013-04-11 DIAGNOSIS — N912 Amenorrhea, unspecified: Secondary | ICD-10-CM

## 2013-04-11 DIAGNOSIS — C439 Malignant melanoma of skin, unspecified: Secondary | ICD-10-CM

## 2013-04-11 DIAGNOSIS — R935 Abnormal findings on diagnostic imaging of other abdominal regions, including retroperitoneum: Secondary | ICD-10-CM

## 2013-04-11 NOTE — Telephone Encounter (Signed)
After discussion with Derm and Oncology in a patient with history of melanoma, obtain an MRI of the abdomen with contrast to evaluate for metastatic cancer of the liver.

## 2013-04-11 NOTE — Telephone Encounter (Signed)
After discussion with Derm and Oncology in a patient with history of melanoma, obtain an MRI of the abdomen with contrast to evaluate for metastatic cancer of the liver.  RADIOLOGY REPORT*   Clinical Data:  Gallstones   ABDOMINAL ULTRASOUND COMPLETE   Comparison:  None   Findings:   Gallbladder:  No gallstones, gallbladder wall thickening, or pericholecystic fluid. Negative sonographic Murphy's sign.   Common Bile Duct:  Within normal limits in caliber.   Liver: A total of 30 small echogenic structures are noted within the liver parenchyma.  Lesion in the left hepatic lobe measures 0.5 cm, image 68.  Lesion also in the left hepatic lobe measures 0.8 cm.  The lesion in the right hepatic lobe measures approximately 0.7 cm.  Within normal limits in parenchymal echogenicity.   IVC:  Appears normal.   Pancreas:  No abnormality identified.   Spleen:  Within normal limits in size and echotexture.   Right kidney:  Normal in size and parenchymal echogenicity.  No evidence of mass or hydronephrosis.   Left kidney:  Normal in size and parenchymal echogenicity.  No evidence of mass or hydronephrosis.   Abdominal Aorta:  No aneurysm identified.   IMPRESSION: 1.  No acute findings.  No evidence for gallstones. 2.  Multi focal echogenic structures within the liver parenchyma. These are favored to represent benign hemangiomas.  Advise follow- up imaging in 36 months to confirm stability.  At this time the study of choice would be a contrast-enhanced MRI of the liver.     Original Report Authenticated By: Signa Kell, M.D.

## 2013-04-11 NOTE — Telephone Encounter (Signed)
Crystal [name] with Dr Annitta Jersey office (Dermatologist) called; pt was seen in Dr Annitta Jersey office on 04/09/13; pt told doctor about recent US showing spots on liver and due to pts past history of melanoma pt was referred to oncologist; Dr Cyndie Chime. Dr Cyndie Chime request PCP to order MRI with contrast now and if positive Dr Cyndie Chime will see pt.Please advise. Crystal [name] request call back.

## 2013-04-11 NOTE — Telephone Encounter (Signed)
Please call Efraim Kaufmann below as requested   Hannah Beat, MD 04/11/2013, 1:58 PM

## 2013-04-13 ENCOUNTER — Other Ambulatory Visit (HOSPITAL_COMMUNITY): Payer: 59

## 2013-04-16 ENCOUNTER — Ambulatory Visit (HOSPITAL_COMMUNITY)
Admission: RE | Admit: 2013-04-16 | Discharge: 2013-04-16 | Disposition: A | Discharge status: Home / Self Care | Payer: 59 | Source: Ambulatory Visit | Attending: Family Medicine | Admitting: Family Medicine

## 2013-04-16 DIAGNOSIS — C439 Malignant melanoma of skin, unspecified: Secondary | ICD-10-CM

## 2013-04-16 DIAGNOSIS — Z85820 Personal history of malignant melanoma of skin: Secondary | ICD-10-CM | POA: Insufficient documentation

## 2013-04-16 DIAGNOSIS — R9389 Abnormal findings on diagnostic imaging of other specified body structures: Secondary | ICD-10-CM

## 2013-04-16 DIAGNOSIS — R935 Abnormal findings on diagnostic imaging of other abdominal regions, including retroperitoneum: Secondary | ICD-10-CM | POA: Insufficient documentation

## 2013-04-16 DIAGNOSIS — R1013 Epigastric pain: Secondary | ICD-10-CM | POA: Insufficient documentation

## 2013-04-16 DIAGNOSIS — R1011 Right upper quadrant pain: Secondary | ICD-10-CM | POA: Insufficient documentation

## 2013-04-16 DIAGNOSIS — K7689 Other specified diseases of liver: Secondary | ICD-10-CM | POA: Insufficient documentation

## 2013-04-16 MED ORDER — GADOBENATE DIMEGLUMINE 529 MG/ML IV SOLN
13.0000 mL | Freq: Once | INTRAVENOUS | Status: AC | PRN
  Administered 2013-04-16: 13 mL via INTRAVENOUS

## 2013-09-27 ENCOUNTER — Encounter: Payer: Self-pay | Admitting: Family Medicine

## 2013-09-27 ENCOUNTER — Ambulatory Visit (INDEPENDENT_AMBULATORY_CARE_PROVIDER_SITE_OTHER): Payer: 59 | Admitting: Family Medicine

## 2013-09-27 VITALS — BP 96/58 | HR 81 | Temp 98.5°F | Wt 148.0 lb

## 2013-09-27 DIAGNOSIS — J029 Acute pharyngitis, unspecified: Secondary | ICD-10-CM

## 2013-09-27 MED ORDER — AMOXICILLIN 875 MG PO TABS: 875.0000 mg | ORAL_TABLET | Freq: Two times a day (BID) | ORAL | Status: AC

## 2013-09-27 MED ORDER — SUMATRIPTAN SUCCINATE 100 MG PO TABS: 100.0000 mg | ORAL_TABLET | ORAL | Status: DC | PRN

## 2013-09-27 NOTE — Patient Instructions (Addendum)
Start the antibiotics and get some rest.  Gargle with salt water.  Take care.

## 2013-09-27 NOTE — Progress Notes (Signed)
She needed a refill on imitrex.  It had worked prev w/o ADE.    duration of symptoms: likely since last week.   Rhinorrhea: no Congestion: yes ear pain: L only sore throat:yes Cough: no myalgias: other concerns: voice is altered. Kids with strep throat at home.  Had a flu shot.   Fevers noted.    ROS: See HPI.  Otherwise negative.    Meds, vitals, and allergies reviewed.   GEN: nad, alert and oriented HEENT: mucous membranes moist, TM w/o erythema, nasal epithelium injected, OP with cobblestoning NECK: supple w/ tender LA CV: rrr. PULM: ctab, no inc wob ABD: soft, +bs EXT: no edema

## 2013-09-28 NOTE — Assessment & Plan Note (Signed)
Presumed strep given hx and exam, start amoxil and f/u prn. She agrees.

## 2014-05-13 ENCOUNTER — Encounter (HOSPITAL_COMMUNITY): Payer: Self-pay | Admitting: Pharmacist

## 2014-05-22 NOTE — H&P (Signed)
Crystal [name] is an 33 y.o. female. She was seen for an annual Gyn exam 2 weeks ago. Having regular menses, on the heavier side and she would prefer not to have them.  She is having increased urine leak with jump, sneeze and laugh and would like something done about it. She is also interested in permanent sterility.    Pertinent Gynecological History: Last pap: normal Date: 73 OB History: G4, P3013 SVD at term x 3 without complications   Menstrual History: No LMP recorded.    Past Medical History  Diagnosis Date  . Allergy   . Asthma   Melanoma right calf  Past Surgical History  Procedure Laterality Date  . Skin cancer excision    Cryo for CIN I in 2004  Family History  Problem Relation Age of Onset  . Fibromyalgia Father   . Autoimmune disease Father   . Cancer Maternal Grandmother     stomach  . Diabetes Maternal Grandmother   . Diabetes Paternal Grandmother   . Heart disease Paternal Grandmother   . Alcohol abuse Paternal Grandfather   . Cancer Paternal Grandfather     lung    Social History:  reports that she has never smoked. She does not have any smokeless tobacco history on file. She reports that she does not drink alcohol or use illicit drugs.  Allergies:  Allergies  Allergen Reactions  . Dextromethorphan Hbr Rash    No prescriptions prior to admission    Review of Systems  Respiratory: Negative.   Cardiovascular: Negative.   Gastrointestinal: Negative.   Genitourinary: Negative.     There were no vitals taken for this visit. Physical Exam  Constitutional: She appears well-developed and well-nourished.  Neck: Neck supple. No thyromegaly present.  Cardiovascular: Normal rate, regular rhythm and normal heart sounds.   No murmur heard. Respiratory: Effort normal and breath sounds normal. No respiratory distress. She has no wheezes.  GI: Soft. She exhibits no distension and no mass. There is no tenderness.  Genitourinary: Vagina normal and uterus  normal.  Uterus is small, NT No adnexal mass Hypermobile urethra    No results found for this or any previous visit (from the past 24 hour(s)).  No results found.  Assessment/Plan: Symptomatic SUI with hypermobile urethra and no OAB symptoms, mild menorrhagia and desire for permanent sterility.  Discussed all medical and surgical options.  Discussed risks of surgery, including risks specific to mesh sling, and surgical procedures in detail.  Will admit for laparoscopic bilateral salpingectomy, hysteroscopy with Novasure and Solyx sling.    Danni Shima 05/22/2014, 9:08 PM

## 2014-05-23 ENCOUNTER — Ambulatory Visit (HOSPITAL_COMMUNITY): Payer: 59 | Admitting: Anesthesiology

## 2014-05-23 ENCOUNTER — Encounter (HOSPITAL_COMMUNITY): Payer: Self-pay | Admitting: Anesthesiology

## 2014-05-23 ENCOUNTER — Encounter (HOSPITAL_COMMUNITY)
Admission: RE | Disposition: A | Discharge status: Home / Self Care | Payer: Self-pay | Source: Ambulatory Visit | Attending: Obstetrics and Gynecology

## 2014-05-23 ENCOUNTER — Ambulatory Visit (HOSPITAL_COMMUNITY)
Admission: RE | Admit: 2014-05-23 | Discharge: 2014-05-23 | Disposition: A | Discharge status: Home / Self Care | Payer: 59 | Source: Ambulatory Visit | Attending: Obstetrics and Gynecology | Admitting: Obstetrics and Gynecology

## 2014-05-23 ENCOUNTER — Encounter (HOSPITAL_COMMUNITY): Payer: 59 | Admitting: Anesthesiology

## 2014-05-23 DIAGNOSIS — N3641 Hypermobility of urethra: Secondary | ICD-10-CM | POA: Insufficient documentation

## 2014-05-23 DIAGNOSIS — N393 Stress incontinence (female) (male): Secondary | ICD-10-CM | POA: Insufficient documentation

## 2014-05-23 HISTORY — PX: HYSTEROSCOPY WITH NOVASURE: SHX5574

## 2014-05-23 LAB — PREGNANCY, URINE: Preg Test, Ur: NEGATIVE

## 2014-05-23 SURGERY — HYSTEROSCOPY WITH NOVASURE
Anesthesia: General | Site: Vagina

## 2014-05-23 MED ORDER — ONDANSETRON HCL 4 MG/2ML IJ SOLN: 4.0000 mg | Freq: Once | INTRAMUSCULAR | Status: DC | PRN

## 2014-05-23 MED ORDER — PROPOFOL 10 MG/ML IV BOLUS
INTRAVENOUS | Status: DC | PRN
  Administered 2014-05-23: 150 mg via INTRAVENOUS

## 2014-05-23 MED ORDER — CEFAZOLIN SODIUM-DEXTROSE 2-3 GM-% IV SOLR
2.0000 g | INTRAVENOUS | Status: AC
  Administered 2014-05-23: 2 g via INTRAVENOUS

## 2014-05-23 MED ORDER — GLYCOPYRROLATE 0.2 MG/ML IJ SOLN
INTRAMUSCULAR | Status: AC
  Filled 2014-05-23: qty 1

## 2014-05-23 MED ORDER — GLYCOPYRROLATE 0.2 MG/ML IJ SOLN
INTRAMUSCULAR | Status: DC | PRN
  Administered 2014-05-23 (×2): 0.1 mg via INTRAVENOUS

## 2014-05-23 MED ORDER — MEPERIDINE HCL 25 MG/ML IJ SOLN: 6.2500 mg | INTRAMUSCULAR | Status: DC | PRN

## 2014-05-23 MED ORDER — KETOROLAC TROMETHAMINE 30 MG/ML IJ SOLN
INTRAMUSCULAR | Status: AC
  Filled 2014-05-23: qty 1

## 2014-05-23 MED ORDER — FENTANYL CITRATE 0.05 MG/ML IJ SOLN
INTRAMUSCULAR | Status: DC | PRN
  Administered 2014-05-23: 100 ug via INTRAVENOUS
  Administered 2014-05-23: 50 ug via INTRAVENOUS

## 2014-05-23 MED ORDER — EPHEDRINE 5 MG/ML INJ
INTRAVENOUS | Status: AC
  Filled 2014-05-23: qty 10

## 2014-05-23 MED ORDER — FENTANYL CITRATE 0.05 MG/ML IJ SOLN
INTRAMUSCULAR | Status: AC
  Filled 2014-05-23: qty 2

## 2014-05-23 MED ORDER — ONDANSETRON HCL 4 MG/2ML IJ SOLN
INTRAMUSCULAR | Status: AC
  Filled 2014-05-23: qty 2

## 2014-05-23 MED ORDER — FENTANYL CITRATE 0.05 MG/ML IJ SOLN
INTRAMUSCULAR | Status: AC
  Filled 2014-05-23: qty 5

## 2014-05-23 MED ORDER — LIDOCAINE HCL (CARDIAC) 20 MG/ML IV SOLN
INTRAVENOUS | Status: AC
  Filled 2014-05-23: qty 5

## 2014-05-23 MED ORDER — DEXAMETHASONE SODIUM PHOSPHATE 10 MG/ML IJ SOLN
INTRAMUSCULAR | Status: AC
  Filled 2014-05-23: qty 1

## 2014-05-23 MED ORDER — MIDAZOLAM HCL 2 MG/2ML IJ SOLN
INTRAMUSCULAR | Status: AC
  Filled 2014-05-23: qty 2

## 2014-05-23 MED ORDER — LIDOCAINE HCL 2 % IJ SOLN
INTRAMUSCULAR | Status: AC
  Filled 2014-05-23: qty 20

## 2014-05-23 MED ORDER — HYDROCODONE-ACETAMINOPHEN 5-325 MG PO TABS: 1.0000 | ORAL_TABLET | Freq: Four times a day (QID) | ORAL | Status: DC | PRN

## 2014-05-23 MED ORDER — LEVOFLOXACIN 500 MG PO TABS: 500.0000 mg | ORAL_TABLET | Freq: Every day | ORAL | Status: DC

## 2014-05-23 MED ORDER — STERILE WATER FOR IRRIGATION IR SOLN
Status: DC | PRN
  Administered 2014-05-23: 1000 mL

## 2014-05-23 MED ORDER — LIDOCAINE HCL 2 % IJ SOLN
INTRAMUSCULAR | Status: DC | PRN
  Administered 2014-05-23: 10 mL

## 2014-05-23 MED ORDER — EPHEDRINE SULFATE 50 MG/ML IJ SOLN
INTRAMUSCULAR | Status: DC | PRN
  Administered 2014-05-23: 10 mg via INTRAVENOUS

## 2014-05-23 MED ORDER — LACTATED RINGERS IV SOLN
INTRAVENOUS | Status: DC
  Administered 2014-05-23: 10:00:00 via INTRAVENOUS

## 2014-05-23 MED ORDER — DEXAMETHASONE SODIUM PHOSPHATE 10 MG/ML IJ SOLN
INTRAMUSCULAR | Status: DC | PRN
  Administered 2014-05-23: 10 mg via INTRAVENOUS

## 2014-05-23 MED ORDER — PROPOFOL 10 MG/ML IV EMUL
INTRAVENOUS | Status: AC
  Filled 2014-05-23: qty 20

## 2014-05-23 MED ORDER — LIDOCAINE HCL (CARDIAC) 20 MG/ML IV SOLN
INTRAVENOUS | Status: DC | PRN
  Administered 2014-05-23: 100 mg via INTRAVENOUS

## 2014-05-23 MED ORDER — FENTANYL CITRATE 0.05 MG/ML IJ SOLN: 25.0000 ug | INTRAMUSCULAR | Status: DC | PRN

## 2014-05-23 MED ORDER — KETOROLAC TROMETHAMINE 30 MG/ML IJ SOLN: 15.0000 mg | Freq: Once | INTRAMUSCULAR | Status: DC | PRN

## 2014-05-23 MED ORDER — LACTATED RINGERS IV SOLN: INTRAVENOUS | Status: DC

## 2014-05-23 MED ORDER — MIDAZOLAM HCL 2 MG/2ML IJ SOLN
INTRAMUSCULAR | Status: DC | PRN
  Administered 2014-05-23: 2 mg via INTRAVENOUS

## 2014-05-23 SURGICAL SUPPLY — 22 items
ABLATOR ENDOMETRIAL BIPOLAR (ABLATOR) ×1 IMPLANT
CATH ROBINSON RED A/P 16FR (CATHETERS) ×3 IMPLANT
CLOTH BEACON ORANGE TIMEOUT ST (SAFETY) ×3 IMPLANT
DRAPE HYSTEROSCOPY (DRAPE) ×3 IMPLANT
DRSG TELFA 3X8 NADH (GAUZE/BANDAGES/DRESSINGS) ×3 IMPLANT
GLOVE BIO SURGEON STRL SZ8 (GLOVE) ×3 IMPLANT
GLOVE ORTHO TXT STRL SZ7.5 (GLOVE) ×3 IMPLANT
GOWN STRL REUS W/TWL LRG LVL3 (GOWN DISPOSABLE) ×6 IMPLANT
NDL SPNL 22GX3.5 QUINCKE BK (NEEDLE) ×2 IMPLANT
NEEDLE SPNL 22GX3.5 QUINCKE BK (NEEDLE) ×6 IMPLANT
PACK VAGINAL MINOR WOMEN LF (CUSTOM PROCEDURE TRAY) ×3 IMPLANT
PAD DRESSING TELFA 3X8 NADH (GAUZE/BANDAGES/DRESSINGS) ×1 IMPLANT
PAD OB MATERNITY 4.3X12.25 (PERSONAL CARE ITEMS) ×3 IMPLANT
SET TUBING HYSTEROSCOPY 2 NDL (TUBING) IMPLANT
SLING SOLYX SYSTEM SIS EA (Sling) ×2 IMPLANT
SUT VIC AB 2-0 CT1 27 (SUTURE) ×3
SUT VIC AB 2-0 CT1 TAPERPNT 27 (SUTURE) IMPLANT
SYR CONTROL 10ML LL (SYRINGE) ×6 IMPLANT
TOWEL OR 17X24 6PK STRL BLUE (TOWEL DISPOSABLE) ×6 IMPLANT
TUBE HYSTEROSCOPY W Y-CONNECT (TUBING) IMPLANT
WATER STERILE IRR 1000ML POUR (IV SOLUTION) ×3 IMPLANT
YANKAUER SUCT BULB TIP NO VENT (SUCTIONS) ×2 IMPLANT

## 2014-05-23 NOTE — Anesthesia Postprocedure Evaluation (Signed)
Anesthesia Post Note  Patient: Crystal [name]  Procedure(s) Performed: Procedure(s) (LRB): SOLYX SLING (N/A)  Anesthesia type: General  Patient location: PACU  Post pain: Pain level controlled  Post assessment: Post-op Vital signs reviewed  Last Vitals:  Filed Vitals:   05/23/14 1215  BP: 106/61  Pulse: 84  Temp:   Resp: 19    Post vital signs: Reviewed  Level of consciousness: sedated  Complications: No apparent anesthesia complications

## 2014-05-23 NOTE — Op Note (Addendum)
Preoperative diagnosis: Stress urinary incontinence Postoperative diagnosis: Stress urinary incontinence Procedure: Solyx sling Surgeon: Cheri Fowler M.D. Anesthesia: Gen. With an LMA Findings: On exam under anesthesia, uterus is small, MP-RV, normal ovaries, no adnexal mass.  She had a hypermobile urethra. With cystoscopy there was no injury to the bladder or urethra. Estimated blood loss: 235 cc Complications: None  Procedure in detail:  Patient was taken to the operating room and placed in the dorsosupine position. General anesthesia was induced and she was placed in mobile stirrups. Perineum, vagina and lower abdomen were then prepped and draped in usual sterile fashion and bladder drained with a red Robinson catheter. Legs were elevated about halfway in the stirrups. The anterior vagina was grasped with Allis clamps proximally 1 1/2 fingerbreadths from the urethral meatus. Local anesthetic was infiltrated in the midline and bilaterally for hydrodissection. A 1 cm vertical incision was was then made in the vagina between the Allis clamps. The edges of the incision were then grasped with the Allis clamps. Metzenbaum scissors were used to sharply dissected the vaginal mucosa to each pubic ramus. The Solyx sling was then first placed on the patient's right side and anchored behind the pubic bone, good placement was achieved. Placement was achieved on the left side in a similar fashion submucosal to just past the pubic ramus. A right angle clamp was able to just barely be passed between the sling and the urethral tissue confirming good tension. Cystoscopy was performed which revealed a normal bladder and no evidence of injury to the bladder or the urethra. 200 cc of fluid was used for the cystoscopy. The cystoscope was removed. A Cred maneuver was performed and no leakage was seen. The Solyx sling was released on the left side. The vaginal incision was then closed with running locking 2-0 Vicryl with  adequate closure and adequate hemostasis. All instruments were then removed from the vagina. The patient tolerated the procedure well and was taken to the recovery room in stable condition. Counts were correct, she received Ancef prior to the procedure, she had PAS hose on throughout the procedure.

## 2014-05-23 NOTE — Interval H&P Note (Signed)
History and Physical Interval Note:  05/23/2014 9:59 AM  Crystal [name]  has presented today for surgery, with the diagnosis of Sterilization, Menorrhagia, STRESS URINARY INCONTINENCE  The various methods of treatment have been discussed with the patient and family. After consideration of risks, benefits and other options for treatment, the patient has consented to  Procedure(s): HYSTEROSCOPY WITH NOVASURE, SOLYX SLING, BILATERAL SALPINGECTOMY (N/A) as a surgical intervention .  The patient's history has been reviewed, patient examined, no change in status, stable for surgery.  I have reviewed the patient's chart and labs.  Questions were answered to the patient's satisfaction.    She has decided she wants the sling only, she does not want to take away the option of conceiving.     [employer]

## 2014-05-23 NOTE — Discharge Instructions (Addendum)
°  DO NOT TAKE IBUPROFEN (ADVIL, ALEVE, OR MOTRIN) TILL AFTER 5 PM!   Tension Free Vaginal Tape System, Care After Please read the instructions below. Refer to these instructions for the next few weeks. These instructions provide you with general information on caring for yourself after your operation. Your caregiver may also give you specific instructions. While your treatment has been planned according to the most current medical practices available, unavoidable problems sometimes occur. Discomfort from the operation area is normal for a couple of weeks. If you have any questions or problems after discharge, please call your caregiver. HOME CARE INSTRUCTIONS   Take your prescribed medications as directed by your caregiver.  You may take over-the-counter medication for minor pain with your caregiver's recommendation.  You may resume your usual diet.  Do not take aspirin. It can cause bleeding.  It is helpful to have a responsible person with you for a few days or a week after the surgery.  Shower or bath as directed.  Do not lift anything over 5 pounds.  Change your dressing as directed.  Do not drive until your caregiver says it is OK.  Do not have sexual intercourse until your caregiver says it is OK.  Do not use tampons.  You may take a laxative with your caregiver's recommendation.  You may take sitz baths 2 to 3 times a day with your caregiver's advice.  Make and keep your postoperative appointments. SEEK MEDICAL CARE IF:   There is increasing pain in the wound area.  There is swelling or redness in the wound area.  You develop abnormal vaginal discharge.  You develop a rash.  You develop nausea, vomiting, constipation or diarrhea.  You think the stitches in the wound are breaking.  You lose urine when you cough.  You have problems with your medications. SEEK IMMEDIATE MEDICAL CARE IF:   You develop a temperature of 102 F (38.9 C) or higher.  You have  bleeding from the wound area above the pubic bone or from the vagina.  You see pus coming from the incisions.  You cannot urinate.  You develop bloody or painful urination.  You pass out.  You develop leg or chest pain.  You develop abdominal pain.  You develop shortness of breath.

## 2014-05-23 NOTE — Anesthesia Preprocedure Evaluation (Signed)
Anesthesia Evaluation  Patient identified by MRN, date of birth, ID band Patient awake    Reviewed: Allergy & Precautions, H&P , NPO status , Patient's Chart, lab work & pertinent test results  Airway Mallampati: I TM Distance: >3 FB Neck ROM: full    Dental no notable dental hx. (+) Teeth Intact   Pulmonary    Pulmonary exam normal       Cardiovascular negative cardio ROS      Neuro/Psych negative psych ROS   GI/Hepatic negative GI ROS, Neg liver ROS,   Endo/Other  negative endocrine ROS  Renal/GU negative Renal ROS     Musculoskeletal   Abdominal Normal abdominal exam  (+)   Peds  Hematology negative hematology ROS (+)   Anesthesia Other Findings   Reproductive/Obstetrics negative OB ROS                           Anesthesia Physical Anesthesia Plan  ASA: II  Anesthesia Plan: General   Post-op Pain Management:    Induction: Intravenous  Airway Management Planned: LMA  Additional Equipment:   Intra-op Plan:   Post-operative Plan:   Informed Consent: I have reviewed the patients History and Physical, chart, labs and discussed the procedure including the risks, benefits and alternatives for the proposed anesthesia with the patient or authorized representative who has indicated his/her understanding and acceptance.     Plan Discussed with: CRNA and Surgeon  Anesthesia Plan Comments:         Anesthesia Quick Evaluation

## 2014-05-23 NOTE — Transfer of Care (Signed)
Immediate Anesthesia Transfer of Care Note  Patient: Crystal [name]  Procedure(s) Performed: Procedure(s): Velvet Bathe (N/A)  Patient Location: PACU  Anesthesia Type:General  Level of Consciousness: awake, alert  and oriented  Airway & Oxygen Therapy: Patient Spontanous Breathing and Patient connected to nasal cannula oxygen  Post-op Assessment: Report given to PACU RN and Post -op Vital signs reviewed and stable  Post vital signs: Reviewed and stable  Complications: No apparent anesthesia complications

## 2014-05-23 NOTE — Anesthesia Procedure Notes (Signed)
Procedure Name: LMA Insertion Date/Time: 05/23/2014 10:16 AM Performed by: Aubrei Bouchie, Sheron Nightingale Pre-anesthesia Checklist: Patient identified, Timeout performed, Emergency Drugs available, Suction available and Patient being monitored Patient Re-evaluated:Patient Re-evaluated prior to inductionOxygen Delivery Method: Circle system utilized Preoxygenation: Pre-oxygenation with 100% oxygen Intubation Type: IV induction Ventilation: Mask ventilation without difficulty LMA: LMA inserted LMA Size: 4.0 Grade View: Grade I Tube size: 4.0 mm Number of attempts: 1 Placement Confirmation: positive ETCO2 and breath sounds checked- equal and bilateral ETT to lip (cm): at liops. Dental Injury: Teeth and Oropharynx as per pre-operative assessment

## 2014-05-26 ENCOUNTER — Encounter (HOSPITAL_COMMUNITY): Payer: Self-pay | Admitting: Obstetrics and Gynecology

## 2014-09-05 ENCOUNTER — Ambulatory Visit (INDEPENDENT_AMBULATORY_CARE_PROVIDER_SITE_OTHER): Payer: 59 | Admitting: Family Medicine

## 2014-09-05 ENCOUNTER — Encounter: Payer: Self-pay | Admitting: Family Medicine

## 2014-09-05 VITALS — BP 125/83 | HR 61 | Temp 98.1°F | Ht 63.5 in | Wt 143.2 lb

## 2014-09-05 DIAGNOSIS — R079 Chest pain, unspecified: Secondary | ICD-10-CM

## 2014-09-05 DIAGNOSIS — S76112A Strain of left quadriceps muscle, fascia and tendon, initial encounter: Secondary | ICD-10-CM | POA: Insufficient documentation

## 2014-09-05 DIAGNOSIS — IMO0002 Reserved for concepts with insufficient information to code with codable children: Secondary | ICD-10-CM

## 2014-09-05 MED ORDER — MELOXICAM 15 MG PO TABS
15.0000 mg | ORAL_TABLET | Freq: Every day | ORAL | Status: DC
Start: 2014-09-05 — End: 2017-08-24

## 2014-09-05 MED ORDER — SUMATRIPTAN SUCCINATE 100 MG PO TABS: 100.0000 mg | ORAL_TABLET | ORAL | Status: DC | PRN

## 2014-09-05 MED ORDER — OMEPRAZOLE 40 MG PO CPDR: 40.0000 mg | DELAYED_RELEASE_CAPSULE | Freq: Every day | ORAL | Status: DC

## 2014-09-05 NOTE — Progress Notes (Signed)
   Subjective:    Patient ID: Crystal [name], female    DOB: October 23, 1981, 33 y.o.   MRN: [id number]  HPI 33 year old female presents with new onset  B quad pain.  She restarted playing soccer after 15 years. She had been doing sprints on treadmill. She had soreness and tightness in B quad x few weeks   After playing bowling 2 weeks ago. Had sudden pain in left thigh, anterior. Applied ice, heat, advil 800 mg x 1 week.  Thigh felt better.  Played a soccer game with sudden stopping going, kicking. She had pain again. Has been doing stretching. Got better again until she scrimmaged and took of sprinting.   She has also noted  chest pain in last 4 days, central chest radiated to right neck and through to back. No association with meals, occurs at rest, nonexertional , sometime stress related.  Sh ehas been feeling overwhelmed lately. Episode yesterday lasting overnight. Took ASA, tums and zantac.  Fatigue.  She has hx of heartburn, uses prilosec prn several times a week. She drinks a lot of caffeine. ( 2-3 a day red bull, energy drink).      Review of Systems  Constitutional: Negative for fever and fatigue.  HENT: Negative for ear pain.   Eyes: Negative for pain.  Respiratory: Negative for chest tightness and shortness of breath.   Cardiovascular: Negative for chest pain, palpitations and leg swelling.  Gastrointestinal: Negative for abdominal pain.  Genitourinary: Negative for dysuria.       Objective:   Physical Exam  Constitutional: Vital signs are normal. She appears well-developed and well-nourished. She is cooperative.  Non-toxic appearance. She does not appear ill. No distress.  HENT:  Head: Normocephalic.  Right Ear: Hearing, tympanic membrane, external ear and ear canal normal. Tympanic membrane is not erythematous, not retracted and not bulging.  Left Ear: Hearing, tympanic membrane, external ear and ear canal normal. Tympanic membrane is not erythematous, not  retracted and not bulging.  Nose: No mucosal edema or rhinorrhea. Right sinus exhibits no maxillary sinus tenderness and no frontal sinus tenderness. Left sinus exhibits no maxillary sinus tenderness and no frontal sinus tenderness.  Mouth/Throat: Uvula is midline, oropharynx is clear and moist and mucous membranes are normal.  Eyes: Conjunctivae, EOM and lids are normal. Pupils are equal, round, and reactive to light. Lids are everted and swept, no foreign bodies found.  Neck: Trachea normal and normal range of motion. Neck supple. Carotid bruit is not present. No mass and no thyromegaly present.  Cardiovascular: Normal rate, regular rhythm, S1 normal, S2 normal, normal heart sounds, intact distal pulses and normal pulses.  Exam reveals no gallop and no friction rub.   No murmur heard. Pulmonary/Chest: Effort normal and breath sounds normal. Not tachypneic. No respiratory distress. She has no decreased breath sounds. She has no wheezes. She has no rhonchi. She has no rales. Chest wall is not dull to percussion. She exhibits bony tenderness. She exhibits no mass.    Abdominal: Soft. Normal appearance and bowel sounds are normal. There is no tenderness.  Neurological: She is alert.  Skin: Skin is warm, dry and intact. No rash noted.  Psychiatric: Her speech is normal and behavior is normal. Judgment and thought content normal. Her mood appears not anxious. Cognition and memory are normal. She does not exhibit a depressed mood.          Assessment & Plan:

## 2014-09-05 NOTE — Patient Instructions (Addendum)
Start home PT for left thigh, meloxicam daily for inflammation and pain x 5 days then as needed daily. Keep appt with sports MD if not improving in 2 weeks. Stop if chest pain/stomach irritation worse. Avoid reflux triggers as discussed. Start prilosec 40 mg daily x 3-4 weeks then taper off. C Follow up here for chest pain and mood in 2 weeks.

## 2014-09-05 NOTE — Assessment & Plan Note (Signed)
Eval EKG. N sign of DVT or PE.  Most likely multifactorial. ? GI, anxiety and or costochondritis.  Decrease caffeine. Stress reduction.  Start prilosec 40 mg daily.  Avoid reflux triggers.  Follow up in 2 week to re-eval along with mood.

## 2014-09-05 NOTE — Assessment & Plan Note (Signed)
Stop advil Change to Cox2 inhibitor for less possible GI upset.  Ice, rehab info given.  No sprints, soccer fast starts x 2 weeks. Follow up with sports med, Dr. Tamala Julian

## 2014-09-05 NOTE — Progress Notes (Signed)
Pre visit review using our clinic review tool, if applicable. No additional management support is needed unless otherwise documented below in the visit note. 

## 2014-09-19 ENCOUNTER — Ambulatory Visit (INDEPENDENT_AMBULATORY_CARE_PROVIDER_SITE_OTHER): Payer: 59 | Admitting: Family Medicine

## 2014-09-19 ENCOUNTER — Encounter: Payer: Self-pay | Admitting: Family Medicine

## 2014-09-19 ENCOUNTER — Other Ambulatory Visit (INDEPENDENT_AMBULATORY_CARE_PROVIDER_SITE_OTHER): Payer: 59

## 2014-09-19 VITALS — BP 102/60 | HR 82 | Ht 64.0 in | Wt 150.0 lb

## 2014-09-19 DIAGNOSIS — M79609 Pain in unspecified limb: Secondary | ICD-10-CM

## 2014-09-19 DIAGNOSIS — M79605 Pain in left leg: Secondary | ICD-10-CM

## 2014-09-19 DIAGNOSIS — S76112A Strain of left quadriceps muscle, fascia and tendon, initial encounter: Secondary | ICD-10-CM

## 2014-09-19 DIAGNOSIS — IMO0002 Reserved for concepts with insufficient information to code with codable children: Secondary | ICD-10-CM

## 2014-09-19 NOTE — Patient Instructions (Addendum)
Very nice to meet you Ice 20 minutes 2 times daily. Usually after activity and before bed. Exercises 3 times a week.  Vitamin D 2000 IU daily Turmeric 500mg  twice daily.  Meloxicam daily 10 days then as needed 4:1 ratio of carbs to protein example is chocolate milk My favorite protein is whey protein isolate.  Come back in 3-4 weeks.

## 2014-09-19 NOTE — Assessment & Plan Note (Signed)
Patient actually did have a true tear in her quadriceps and does seem to be healing at this time. We discussed the importance of nutrition and keeping patient's muscle glycogen levels high and we did discuss this will help with healing. We discussed over-the-counter medicines that could help with healing and to continue meloxicam for pain relief. Patient will wear compression sleeve as well as doing facing protocol. Patient was given home exercise program and he will do 3 times a week. Patient and will come back and see me in 3-4 weeks and we'll advance her as tolerated.

## 2014-09-19 NOTE — Progress Notes (Signed)
[name] [address] [place], [place] [address] Phone: [phone] Subjective:    I'm seeing this patient by the request  of:  Eliezer Lofts, MD   CC:  Left leg pain  [id number] Crystal [name] is a 33 y.o. female coming in with complaint of left leg pain.  Patient states that she had started to increase her activity recently starting to playing recreational soccer legs as well as doing sprints. Patient had tightness in the quadriceps bilaterally for the first few weeks. Patient then was bowling and unfortunately had sudden pain on the left side. Patient attempted to try to do ibuprofen, alternating ice and heat to the anterior leg and it seemed to improve. Patient started trying to do increasing activities including soccer again on a regular basis and started having more pain. She states that it was very painful when she was kicking or trying to stop. Patient states she stopped doing the activity and started doing more stretching. Patient states and then unfortunately when she tried to start running again the pain came back. Patient rates the severity of 6/10. Patient would like to be able to do more activity the wants to do it safely. Denies any radiation of pain or any numbness or any weakness. Denies any nighttime awakening.     Past medical history, social, surgical and family history all reviewed in electronic medical record.   Review of Systems: No headache, visual changes, nausea, vomiting, diarrhea, constipation, dizziness, abdominal pain, skin rash, fevers, chills, night sweats, weight loss, swollen lymph nodes, body aches, joint swelling, muscle aches, chest pain, shortness of breath, mood changes.   Objective Blood pressure 102/60, pulse 82, height 5\' 4"  (1.626 m), weight 150 lb (68.04 kg), last menstrual period 08/21/2014, SpO2 99.00%.  General: No apparent distress alert and oriented x3 mood and affect normal, dressed appropriately.  HEENT:  Pupils equal, extraocular movements intact  Respiratory: Patient's speak in full sentences and does not appear short of breath  Cardiovascular: No lower extremity edema, non tender, no erythema  Skin: Warm dry intact with no signs of infection or rash on extremities or on axial skeleton.  Abdomen: Soft nontender  Neuro: Cranial nerves II through XII are intact, neurovascularly intact in all extremities with 2+ DTRs and 2+ pulses.  Lymph: No lymphadenopathy of posterior or anterior cervical chain or axillae bilaterally.  Gait normal with good balance and coordination.  MSK:  Non tender with full range of motion and good stability and symmetric strength and tone of shoulders, elbows, wrist, hip, knee and ankles bilaterally.  Knee: Left Normal to inspection with no erythema or effusion or obvious bony abnormalities. Palpation normal with no warmth, joint line tenderness, patellar tenderness, or condyle tenderness. ROM full in flexion and extension and lower leg rotation. Ligaments with solid consistent endpoints including ACL, PCL, LCL, MCL. Negative Mcmurray's, Apley's, and Thessalonian tests. Non painful patellar compression. Patellar glide without crepitus. Patellar and quadriceps tendons unremarkable.  Patient though is tender to palpation with resisted strength of the quadriceps on the left side. Patient is minimally tender to palpation over the anterior quadriceps proximal third. No pain over the insertion or origin. Good strength with mild weakness compared to the contralateral side.   MSK US performed of: Left quadriceps This study was ordered, performed, and interpreted by Charlann Boxer D.O. Left quadricep shows the patient does have some scar tissue in the proximal third of the quadriceps of the vastus intermedius. No true defect  still noted in with scar tissue remaining. Increasing Doppler flow is noted.  IMPRESSION: Healing quadricep tear     Impression and Recommendations:      This case required medical decision making of moderate complexity.

## 2014-10-10 ENCOUNTER — Ambulatory Visit (INDEPENDENT_AMBULATORY_CARE_PROVIDER_SITE_OTHER): Payer: 59 | Admitting: Family Medicine

## 2014-10-10 ENCOUNTER — Encounter: Payer: Self-pay | Admitting: Family Medicine

## 2014-10-10 VITALS — BP 96/70 | HR 81 | Ht 64.0 in | Wt 147.0 lb

## 2014-10-10 DIAGNOSIS — S76112D Strain of left quadriceps muscle, fascia and tendon, subsequent encounter: Secondary | ICD-10-CM

## 2014-10-10 DIAGNOSIS — M9903 Segmental and somatic dysfunction of lumbar region: Secondary | ICD-10-CM

## 2014-10-10 DIAGNOSIS — M999 Biomechanical lesion, unspecified: Secondary | ICD-10-CM | POA: Insufficient documentation

## 2014-10-10 DIAGNOSIS — M9902 Segmental and somatic dysfunction of thoracic region: Secondary | ICD-10-CM

## 2014-10-10 DIAGNOSIS — M9904 Segmental and somatic dysfunction of sacral region: Secondary | ICD-10-CM

## 2014-10-10 DIAGNOSIS — M62830 Muscle spasm of back: Secondary | ICD-10-CM

## 2014-10-10 NOTE — Assessment & Plan Note (Signed)
Decision today to treat with OMT was based on Physical Exam  After verbal consent patient was treated with HVLA, ME techniques in thoracici, lumbar and sacral areas  Patient tolerated the procedure well with improvement in symptoms  Patient given exercises, stretches and lifestyle modifications  See medications in patient instructions if given  Patient will follow up in 3 weeks

## 2014-10-10 NOTE — Assessment & Plan Note (Signed)
Patient's back spasm is likely multifactorial mostly of muscle imbalances. Patient does have some mild weakness of the hip abductors as well as a significant decrease in strength in the quadricep to the hamstrings that can cause some malalignment. Patient did respond very well to osteopathic manipulation today and may come back and see me on a regular basis. We discussed the icing regimen and home exercises well. Patient and will come back and see me again in 3 weeks for further evaluation and treatment.

## 2014-10-10 NOTE — Patient Instructions (Signed)
Good to see you Give the quad 2 more weeks Increase vitamin D to 4000 IU daily.  Compression socks or calves sleeves with working out Avoid the treadmill for now Continue the turmeric as well See me in 3 weeks for manipulation and to check quad one more time.

## 2014-10-10 NOTE — Progress Notes (Signed)
[name] [place] [place], [place] [address] Phone: [phone] Subjective:    I'm seeing this patient by the request  of:  Crystal Lofts, MD   CC:  Left leg pain  [id number] Crystal [name] is a 33 y.o. female coming in with complaint of left leg pain.  Patient was seen previously and had more of a quadricep tear from her playing soccer. Patient states that it is approximately 60% better. Still having some discomfort when she tries to run or jump. Patient continues to be able to play soccer though fairly regularly. Patient is going to the gym a regular basis as well while she is trying to lose weight. Patient denies any new symptoms related to the leg.  Patient is having some mild dull aching back pain overall. 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 Patient also has 2 young kids and tries to put him on a regular basis. Denies any radiation of pain down the arms any numbness or tingling. Patient states that she is responding [name] to chiropractic care. Denies that this is stopping her from any activities rates the severity a 4/10.     Past medical history, social, surgical and family history all reviewed in electronic medical record.   Review of Systems: No headache, visual changes, nausea, vomiting, diarrhea, constipation, dizziness, abdominal pain, skin rash, fevers, chills, night sweats, weight loss, swollen lymph nodes, body aches, joint swelling, muscle aches, chest pain, shortness of breath, mood changes.   Objective Blood pressure 96/70, pulse 81, height 5\' 4"  (9.381 m), weight 147 lb (66.679 kg), SpO2 97.00%.  General: No apparent distress alert and oriented x3 mood and affect normal, dressed appropriately.  HEENT: Pupils equal, extraocular movements intact  Respiratory: Patient's speak in full sentences and does not appear short of breath  Cardiovascular: No lower extremity edema, non tender, no erythema  Skin:  Warm dry intact with no signs of infection or rash on extremities or on axial skeleton.  Abdomen: Soft nontender  Neuro: Cranial nerves II through XII are intact, neurovascularly intact in all extremities with 2+ DTRs and 2+ pulses.  Lymph: No lymphadenopathy of posterior or anterior cervical chain or axillae bilaterally.  Gait normal with good balance and coordination.  MSK:  Non tender with full range of motion and good stability and symmetric strength and tone of shoulders, elbows, wrist, hip, knee and ankles bilaterally.  Knee: Left Normal to inspection with no erythema or effusion or obvious bony abnormalities. Palpation normal with no warmth, joint line tenderness, patellar tenderness, or condyle tenderness. ROM full in flexion and extension and lower leg rotation. Ligaments with solid consistent endpoints including ACL, PCL, LCL, MCL. Negative Mcmurray's, Apley's, and Thessalonian tests. Non painful patellar compression. Patellar glide without crepitus. Patellar and quadriceps tendons unremarkable.  Quadriceps is minimally tender. Significantly better than previous exam. Increased strength as well.  Osteopathic findings T3 extended rotated and side bent right T5 extended rotated and side bent left  L2 flexed rotated and side bent right  Sacrum left on left   MSK US performed of: Left quadriceps This study was ordered, performed, and interpreted by Charlann Boxer D.O. Left quadricep shows the patient does have some scar tissue in the proximal third of the quadriceps of the vastus intermedius. No true defect still noted in with scar tissue remaining. Increasing Doppler flow is noted.  IMPRESSION: Healing quadricep tear     Impression and Recommendations:  This case required medical decision making of moderate complexity.

## 2014-10-10 NOTE — Assessment & Plan Note (Signed)
The patient is healing over wall at this time. Encourage her to continue with the exercises and the icing protocol as well as compression when she is playing. Patient and will come back and see me again in 2-3 weeks to make sure she continues to improve. If not we may want to consider nitroglycerin the patient has a history of migraines which usually make that contraindicated. We'll discuss further the patient is not completely healed at followup.

## 2014-10-31 ENCOUNTER — Encounter: Payer: Self-pay | Admitting: Family Medicine

## 2014-10-31 ENCOUNTER — Ambulatory Visit (INDEPENDENT_AMBULATORY_CARE_PROVIDER_SITE_OTHER): Payer: 59 | Admitting: Family Medicine

## 2014-10-31 VITALS — BP 116/74 | HR 75 | Ht 65.0 in | Wt 149.0 lb

## 2014-10-31 DIAGNOSIS — M9904 Segmental and somatic dysfunction of sacral region: Secondary | ICD-10-CM

## 2014-10-31 DIAGNOSIS — M62830 Muscle spasm of back: Secondary | ICD-10-CM

## 2014-10-31 DIAGNOSIS — M999 Biomechanical lesion, unspecified: Secondary | ICD-10-CM

## 2014-10-31 DIAGNOSIS — M9903 Segmental and somatic dysfunction of lumbar region: Secondary | ICD-10-CM

## 2014-10-31 DIAGNOSIS — M9902 Segmental and somatic dysfunction of thoracic region: Secondary | ICD-10-CM

## 2014-10-31 NOTE — Assessment & Plan Note (Signed)
Patient is doing much better at this time. Patient though is having significant increase in stress recently. Patient does have some family matters that is causing some discomfort. Anxiety.  HEP, discussed some stress relieving activities.  Continue icing,  Continue to be active See me again in 4 weeks.

## 2014-10-31 NOTE — Patient Instructions (Signed)
Nothing new Continue what you are doing Test that quad We may need to watch you run if the tibias still hurt See me again in 4 weeks.

## 2014-10-31 NOTE — Progress Notes (Signed)
  [name] [address], [place] [address] Phone: [phone] Subjective:     CC:  Left leg pain follow up  [id number] [name] is a 33 y.o. female coming in with complaint of left leg pain.  Patient was seen previously and had more of a quadricep tear from her playing soccer. Patient states now she only has a mild burning sensation when she tries a splint but no pain. Patient states that she is a proximal a 95% better.  Patient is having some mild dull aching back pain overall. 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 Patient is also having increased stress in her work life balance at the moment. Patient's child is having some difficulty with anxiety and post traumaticstress syndrome after seen the family dog killed.      Past medical history, social, surgical and family history all reviewed in electronic medical record.   Review of Systems: No headache, visual changes, nausea, vomiting, diarrhea, constipation, dizziness, abdominal pain, skin rash, fevers, chills, night sweats, weight loss, swollen lymph nodes, body aches, joint swelling, muscle aches, chest pain, shortness of breath, mood changes.   Objective Blood pressure 116/74, pulse 75, height 5\' 5"  (1.651 m), weight 149 lb (67.586 kg), SpO2 97 %.  General: No apparent distress alert and oriented x3 mood and affect normal, dressed appropriately.  HEENT: Pupils equal, extraocular movements intact  Respiratory: Patient's speak in full sentences and does not appear short of breath  Cardiovascular: No lower extremity edema, non tender, no erythema  Skin: Warm dry intact with no signs of infection or rash on extremities or on axial skeleton.  Abdomen: Soft nontender  Neuro: Cranial nerves II through XII are intact, neurovascularly intact in all extremities with 2+ DTRs and 2+ pulses.  Lymph: No lymphadenopathy of posterior or anterior cervical chain or axillae  bilaterally.  Gait normal with good balance and coordination.  MSK:  Non tender with full range of motion and good stability and symmetric strength and tone of shoulders, elbows, wrist, hip, knee and ankles bilaterally.  Knee: Left Normal to inspection with no erythema or effusion or obvious bony abnormalities. Palpation normal with no warmth, joint line tenderness, patellar tenderness, or condyle tenderness. ROM full in flexion and extension and lower leg rotation. Ligaments with solid consistent endpoints including ACL, PCL, LCL, MCL. Negative Mcmurray's, Apley's, and Thessalonian tests. Non painful patellar compression. Patellar glide without crepitus. Patellar and quadriceps tendons unremarkable.    Osteopathic findings Cervical C4 flexed rotated and side bent left  T3 extended rotated and side bent right T5 extended rotated and side bent left  L2 flexed rotated and side bent right  Sacrum left on left       Impression and Recommendations:     This case required medical decision making of moderate complexity.

## 2014-10-31 NOTE — Assessment & Plan Note (Signed)
Decision today to treat with OMT was based on Physical Exam  After verbal consent patient was treated with HVLA, ME techniques in thoracici, lumbar and sacral areas  Patient tolerated the procedure well with improvement in symptoms  Patient given exercises, stretches and lifestyle modifications  See medications in patient instructions if given  Patient will follow up in 4 weeks        

## 2014-11-28 ENCOUNTER — Ambulatory Visit (INDEPENDENT_AMBULATORY_CARE_PROVIDER_SITE_OTHER): Payer: 59 | Admitting: Family Medicine

## 2014-11-28 ENCOUNTER — Encounter: Payer: Self-pay | Admitting: Family Medicine

## 2014-11-28 VITALS — BP 94/64 | HR 65 | Ht 65.0 in | Wt 149.0 lb

## 2014-11-28 DIAGNOSIS — M9904 Segmental and somatic dysfunction of sacral region: Secondary | ICD-10-CM

## 2014-11-28 DIAGNOSIS — M9903 Segmental and somatic dysfunction of lumbar region: Secondary | ICD-10-CM

## 2014-11-28 DIAGNOSIS — M9902 Segmental and somatic dysfunction of thoracic region: Secondary | ICD-10-CM

## 2014-11-28 DIAGNOSIS — M999 Biomechanical lesion, unspecified: Secondary | ICD-10-CM

## 2014-11-28 DIAGNOSIS — F411 Generalized anxiety disorder: Secondary | ICD-10-CM

## 2014-11-28 DIAGNOSIS — M62830 Muscle spasm of back: Secondary | ICD-10-CM

## 2014-11-28 MED ORDER — VENLAFAXINE HCL ER 37.5 MG PO CP24: 37.5000 mg | ORAL_CAPSULE | Freq: Every day | ORAL | Status: DC

## 2014-11-28 MED ORDER — HYDROXYZINE HCL 25 MG PO TABS: 25.0000 mg | ORAL_TABLET | Freq: Three times a day (TID) | ORAL | Status: DC | PRN

## 2014-11-28 NOTE — Assessment & Plan Note (Signed)
Patient overall is having some mild generalized anxiety disorder nothing some underlying depression. Patient's son did have possible suicidal ideation. Patient i son is having post traumatic stress and this is been very difficult on the patient. We discussed with patient for a long amount of time. Patient will be started on medications. Patient was given hydroxyzine for as needed medication and will start on Effexor low dose. Patient was warned of potential side effects and will call if she has any. Patient come back again no in 2 weeks for further evaluation and treatment.

## 2014-11-28 NOTE — Patient Instructions (Addendum)
Good to se eyou Start the effexor daily.  This is a baby dose.  Hydroxyzine 3 times a day as needed for anxiety See me again in 2 weeks.  Exercises on wall.  Heel and butt touching.  Raise leg 6 inches and hold 2 seconds.  Down slow for count of 4 seconds.  1 set of 30 reps daily on both sides.

## 2014-11-28 NOTE — Assessment & Plan Note (Signed)
Decision today to treat with OMT was based on Physical Exam  After verbal consent patient was treated with HVLA, ME techniques in thoracici, lumbar and sacral areas  Patient tolerated the procedure well with improvement in symptoms  Patient given exercises, stretches and lifestyle modifications  See medications in patient instructions if given  Patient will follow up in 4 weeks        

## 2014-11-28 NOTE — Progress Notes (Signed)
  [name] [place], [place] [address] Phone: [phone] Subjective:     CC: Back pain follow-up  [id number] Crystal [name] is a 33 y.o. female coming in with complaint of left leg pain.  Patient is having mild dull aching pain in the lower back. Patient states mostly it seems to be more Quad tenderness after running. Patient is trying to average proximally 4 miles a day. Patient states that she does more than 2 days and rhythm no she has significant soreness in the legs bilaterally. Patient is getting new shoes and thinks that could be contributing. Patient states though when she changed her running gait than she starts having back pain. Denies any numbness or tingling. Patient is doing protein supplementation and is taking the over-the-counter supple mentation.  .   Patient is also having increased stress in her work life balance at the moment. Patient's child is having some difficulty with anxiety and post traumaticstress syndrome after seen the family dog killed. Patient states she continues to have difficulty. Patient states that this is been very trying on the family overall. Patient has had some trouble and days when she does not feel like getting out of bed. This is difficult when she is taking care of 3 kids.      Past medical history, social, surgical and family history all reviewed in electronic medical record.   Review of Systems: No headache, visual changes, nausea, vomiting, diarrhea, constipation, dizziness, abdominal pain, skin rash, fevers, chills, night sweats, weight loss, swollen lymph nodes, body aches, joint swelling, muscle aches, chest pain, shortness of breath, mood changes.   Objective Blood pressure 94/64, pulse 65, height 5\' 5"  (1.651 m), weight 149 lb (67.586 kg), SpO2 99 %.  General: No apparent distress alert and oriented x3 mood and affect normal, dressed appropriately.  HEENT: Pupils equal, extraocular  movements intact  Respiratory: Patient's speak in full sentences and does not appear short of breath  Cardiovascular: No lower extremity edema, non tender, no erythema  Skin: Warm dry intact with no signs of infection or rash on extremities or on axial skeleton.  Abdomen: Soft nontender  Neuro: Cranial nerves II through XII are intact, neurovascularly intact in all extremities with 2+ DTRs and 2+ pulses.  Lymph: No lymphadenopathy of posterior or anterior cervical chain or axillae bilaterally.  Gait normal with good balance and coordination.  MSK:  Non tender with full range of motion and good stability and symmetric strength and tone of shoulders, elbows, wrist, hip, knee and ankles bilaterally.  Knee: Left Normal to inspection with no erythema or effusion or obvious bony abnormalities. Palpation normal with no warmth, joint line tenderness, patellar tenderness, or condyle tenderness. ROM full in flexion and extension and lower leg rotation. Ligaments with solid consistent endpoints including ACL, PCL, LCL, MCL. Negative Mcmurray's, Apley's, and Thessalonian tests. Non painful patellar compression. Patellar glide without crepitus. Patellar and quadriceps tendons unremarkable.    Osteopathic findings Cervical C4 flexed rotated and side bent left  T3 extended rotated and side bent right T5 extended rotated and side bent left  L2 flexed rotated and side bent right  Sacrum left on left     Impression and Recommendations:     This case required medical decision making of moderate complexity.

## 2014-11-28 NOTE — Assessment & Plan Note (Signed)
Patient is doing much better overall. Patient does have some mild soreness in the quadriceps bilaterally. I think this is more secondary to hip abductor weakness is. Patient was given exercises. Patient does respond very well to osteopathic manipulation. We'll have patient come back again in 2 weeks though secondary to starting new medications. Patient will come back and we will do a gait analysis as well. Discussed hip abductor exercises and showed proper technique.  Spent greater than 25 minutes with patient face-to-face and had greater than 50% of counseling including as described above in assessment and plan.

## 2014-12-15 ENCOUNTER — Ambulatory Visit (INDEPENDENT_AMBULATORY_CARE_PROVIDER_SITE_OTHER): Payer: 59 | Admitting: Family Medicine

## 2014-12-15 ENCOUNTER — Encounter: Payer: Self-pay | Admitting: Family Medicine

## 2014-12-15 VITALS — BP 112/82 | HR 67 | Ht 65.0 in | Wt 149.0 lb

## 2014-12-15 DIAGNOSIS — F411 Generalized anxiety disorder: Secondary | ICD-10-CM

## 2014-12-15 DIAGNOSIS — M9903 Segmental and somatic dysfunction of lumbar region: Secondary | ICD-10-CM

## 2014-12-15 DIAGNOSIS — M9904 Segmental and somatic dysfunction of sacral region: Secondary | ICD-10-CM

## 2014-12-15 DIAGNOSIS — M62830 Muscle spasm of back: Secondary | ICD-10-CM

## 2014-12-15 DIAGNOSIS — M9902 Segmental and somatic dysfunction of thoracic region: Secondary | ICD-10-CM

## 2014-12-15 DIAGNOSIS — M999 Biomechanical lesion, unspecified: Secondary | ICD-10-CM

## 2014-12-15 MED ORDER — VENLAFAXINE HCL ER 37.5 MG PO CP24: 75.0000 mg | ORAL_CAPSULE | Freq: Every day | ORAL | Status: DC

## 2014-12-15 NOTE — Assessment & Plan Note (Signed)
Decision today to treat with OMT was based on Physical Exam  After verbal consent patient was treated with HVLA, ME techniques in thoracici, lumbar and sacral areas  Patient tolerated the procedure well with improvement in symptoms  Patient given exercises, stretches and lifestyle modifications  See medications in patient instructions if given  Patient will follow up in 4 weeks

## 2014-12-15 NOTE — Patient Instructions (Signed)
Good to see you Crystal [name].  For shoulder new exercises See me again in 3 weeks and if shoulder still hurts then we will look at it with ultrasound.

## 2014-12-15 NOTE — Progress Notes (Signed)
  [name] [place],  [address] Phone: [phone] Subjective:     CC: Back pain follow-up  [id number] ALVAH GILDER is a 33 y.o. female coming in lower back pain. Patient states that overall she is doing relatively well with her back pain. Patient has not been working out as much. Patient denies any new symptoms. Denies any radiation of pain down the legs any numbness or tingling.  .   Patient was having signs of some anxiety and depression at last visit. Patient was given Effexor for the generalized anxiety disorder as well as some hydroxyzine for breakthrough anxiety. Patient states overall this has been helping. Patient would like it helped more though if possible..      Past medical history, social, surgical and family history all reviewed in electronic medical record.   Review of Systems: No headache, visual changes, nausea, vomiting, diarrhea, constipation, dizziness, abdominal pain, skin rash, fevers, chills, night sweats, weight loss, swollen lymph nodes, body aches, joint swelling, muscle aches, chest pain, shortness of breath, mood changes.   Objective Blood pressure 112/82, pulse 67, height 5\' 5"  (1.651 m), weight 149 lb (67.586 kg), SpO2 99 %.  General: No apparent distress alert and oriented x3 mood and affect normal, dressed appropriately.  HEENT: Pupils equal, extraocular movements intact  Respiratory: Patient's speak in full sentences and does not appear short of breath  Cardiovascular: No lower extremity edema, non tender, no erythema  Skin: Warm dry intact with no signs of infection or rash on extremities or on axial skeleton.  Abdomen: Soft nontender  Neuro: Cranial nerves II through XII are intact, neurovascularly intact in all extremities with 2+ DTRs and 2+ pulses.  Lymph: No lymphadenopathy of posterior or anterior cervical chain or axillae bilaterally.  Gait normal with good balance and coordination.    MSK:  Non tender with full range of motion and good stability and symmetric strength and tone of shoulders, elbows, wrist, hip, knee and ankles bilaterally.  .    Osteopathic findings Cervical C4 flexed rotated and side bent left  T3 extended rotated and side bent right T5 extended rotated and side bent left  L2 flexed rotated and side bent right  Sacrum left on left     Impression and Recommendations:     This case required medical decision making of moderate complexity.

## 2014-12-15 NOTE — Assessment & Plan Note (Signed)
Continues to do well with conservative therapy. Patient will continue to come back for osteopathic manipulation as long as it continues to be helpful. We discussed core exercises and will be beneficial. Patient and will come back and see me again in 3-4 weeks for further evaluation and treatment.

## 2014-12-15 NOTE — Assessment & Plan Note (Signed)
Patient is doing very well without any homicidal and suicidal ideation. Patient will increase her Effexor at this time. We discussed patient has any side effects that she can take hydroxyzine or give [place] a call. Patient come back in 4 weeks to make sure she is improving. Likely will need to titrate to a total of 150 mg daily.

## 2015-01-05 ENCOUNTER — Ambulatory Visit: Payer: 59 | Admitting: Family Medicine

## 2015-01-16 ENCOUNTER — Ambulatory Visit: Payer: Self-pay | Admitting: Family Medicine

## 2015-03-04 ENCOUNTER — Other Ambulatory Visit: Payer: Self-pay | Admitting: Family Medicine

## 2015-04-15 ENCOUNTER — Other Ambulatory Visit: Payer: Self-pay | Admitting: Family Medicine

## 2015-04-15 NOTE — Telephone Encounter (Signed)
Refill done.  

## 2015-04-22 ENCOUNTER — Encounter: Payer: Self-pay | Admitting: Family Medicine

## 2015-04-22 ENCOUNTER — Ambulatory Visit (INDEPENDENT_AMBULATORY_CARE_PROVIDER_SITE_OTHER): Payer: 59 | Admitting: Family Medicine

## 2015-04-22 VITALS — BP 102/74 | HR 67 | Ht 65.0 in | Wt 149.0 lb

## 2015-04-22 DIAGNOSIS — F411 Generalized anxiety disorder: Secondary | ICD-10-CM | POA: Diagnosis not present

## 2015-04-22 DIAGNOSIS — M9904 Segmental and somatic dysfunction of sacral region: Secondary | ICD-10-CM

## 2015-04-22 DIAGNOSIS — M62830 Muscle spasm of back: Secondary | ICD-10-CM | POA: Diagnosis not present

## 2015-04-22 DIAGNOSIS — M9902 Segmental and somatic dysfunction of thoracic region: Secondary | ICD-10-CM

## 2015-04-22 DIAGNOSIS — M9903 Segmental and somatic dysfunction of lumbar region: Secondary | ICD-10-CM

## 2015-04-22 DIAGNOSIS — M999 Biomechanical lesion, unspecified: Secondary | ICD-10-CM

## 2015-04-22 MED ORDER — VENLAFAXINE HCL ER 150 MG PO CP24: 150.0000 mg | ORAL_CAPSULE | Freq: Every day | ORAL | Status: DC

## 2015-04-22 NOTE — Assessment & Plan Note (Signed)
Patient is having more of a spasm of her hip flexor today. We discussed topical anti-inflammatories and patient given a trial of some anti-inflammatory to take 3 times a day for the next 3 days. We discussed icing and continuing home exercises and staying active. Patient will start to work on some weight loss. Patient will follow-up with me again in 4-6 weeks for further evaluation and treatment.

## 2015-04-22 NOTE — Assessment & Plan Note (Addendum)
Doing better but continues to have some mild discomfort and anxiety from time to time as well as decrease energy. Patient will increase her Effexor 150 mg. I think that this will be her max dose. We will discuss again in 4 weeks.

## 2015-04-22 NOTE — Assessment & Plan Note (Signed)
Decision today to treat with OMT was based on Physical Exam  After verbal consent patient was treated with HVLA, ME techniques in thoracici, lumbar and sacral areas  Patient tolerated the procedure well with improvement in symptoms  Patient given exercises, stretches and lifestyle modifications  See medications in patient instructions if given  Patient will follow up in 4 weeks

## 2015-04-22 NOTE — Patient Instructions (Addendum)
Good to see you You are doing great!!! Ice is your friend Continue the exercises I am impressed.  See me again when you need me.

## 2015-04-22 NOTE — Progress Notes (Signed)
Pre visit review using our clinic review tool, if applicable. No additional management support is needed unless otherwise documented below in the visit note. 

## 2015-04-22 NOTE — Progress Notes (Signed)
  [name] [place], [place] [address] Phone: [phone] Subjective:     CC: Back pain follow-up  [id number] Crystal [name] is a 34 y.o. female coming in lower back pain. Patient states that overall she is doing relatively well with her back pain. Patient states that she was painting the other day and started having spasming in her lower back. Patient states that before this she was doing very well and is working out on a more regular basis. Patient denies radiation down the legs or any numbness or tingling. States though that it is very tight.  .   Patient was having signs of some anxiety and depression at last visit. Patient was given Effexor for the generalized anxiety disorder as well as some hydroxyzine for breakthrough anxiety. Patient states that it has been helping that she still has some mild fatigue. She does think that this is still associated with some other depression. Patient though has noticed that she has been more optimistic recently. Patient denies any suicidal or homicidal ideation.     Past medical history, social, surgical and family history all reviewed in electronic medical record.   Review of Systems: No headache, visual changes, nausea, vomiting, diarrhea, constipation, dizziness, abdominal pain, skin rash, fevers, chills, night sweats, weight loss, swollen lymph nodes, body aches, joint swelling, muscle aches, chest pain, shortness of breath, mood changes.   Objective Blood pressure 102/74, pulse 67, height 5\' 5"  (1.651 m), weight 149 lb (67.586 kg), SpO2 99 %.  General: No apparent distress alert and oriented x3 mood and affect normal, dressed appropriately.  HEENT: Pupils equal, extraocular movements intact  Respiratory: Patient's speak in full sentences and does not appear short of breath  Cardiovascular: No lower extremity edema, non tender, no erythema  Skin: Warm dry intact with no signs of infection or rash  on extremities or on axial skeleton.  Abdomen: Soft nontender  Neuro: Cranial nerves II through XII are intact, neurovascularly intact in all extremities with 2+ DTRs and 2+ pulses.  Lymph: No lymphadenopathy of posterior or anterior cervical chain or axillae bilaterally.  Gait normal with good balance and coordination.  MSK:  Non tender with full range of motion and good stability and symmetric strength and tone of shoulders, elbows, wrist, hip, knee and ankles bilaterally.  . Back Exam:  Inspection: Unremarkable  Motion: Flexion 45 deg, Extension 45 deg, Side Bending to 45 deg bilaterally,  Rotation to 45 deg bilaterally  SLR laying: Negative  XSLR laying: Negative  Palpable tenderness: Mild tenderness over the paraspinal musculature FABER: negative. Sensory change: Gross sensation intact to all lumbar and sacral dermatomes.  Reflexes: 2+ at both patellar tendons, 2+ at achilles tendons, Babinski's downgoing.  Strength at foot  Plantar-flexion: 5/5 Dorsi-flexion: 5/5 Eversion: 5/5 Inversion: 5/5  Leg strength  Quad: 5/5 Hamstring: 5/5 Hip flexor: 5/5 Hip abductors: 5/5  Gait unremarkable.    Osteopathic findings Cervical C4 flexed rotated and side bent left  T3 extended rotated and side bent right T5 extended rotated and side bent left  L2 flexed rotated and side bent right  Sacrum left on left     Impression and Recommendations:     This case required medical decision making of moderate complexity.

## 2015-05-20 ENCOUNTER — Ambulatory Visit: Payer: 59 | Admitting: Family Medicine

## 2015-09-11 ENCOUNTER — Encounter: Payer: Self-pay | Admitting: Family Medicine

## 2015-09-11 ENCOUNTER — Ambulatory Visit (INDEPENDENT_AMBULATORY_CARE_PROVIDER_SITE_OTHER): Payer: 59 | Admitting: Family Medicine

## 2015-09-11 VITALS — BP 118/80 | HR 76 | Wt 148.0 lb

## 2015-09-11 DIAGNOSIS — M9902 Segmental and somatic dysfunction of thoracic region: Secondary | ICD-10-CM

## 2015-09-11 DIAGNOSIS — M62830 Muscle spasm of back: Secondary | ICD-10-CM | POA: Diagnosis not present

## 2015-09-11 DIAGNOSIS — G5630 Lesion of radial nerve, unspecified upper limb: Secondary | ICD-10-CM | POA: Insufficient documentation

## 2015-09-11 DIAGNOSIS — G5631 Lesion of radial nerve, right upper limb: Secondary | ICD-10-CM | POA: Diagnosis not present

## 2015-09-11 DIAGNOSIS — M9903 Segmental and somatic dysfunction of lumbar region: Secondary | ICD-10-CM

## 2015-09-11 DIAGNOSIS — M9904 Segmental and somatic dysfunction of sacral region: Secondary | ICD-10-CM

## 2015-09-11 DIAGNOSIS — M999 Biomechanical lesion, unspecified: Secondary | ICD-10-CM

## 2015-09-11 DIAGNOSIS — F411 Generalized anxiety disorder: Secondary | ICD-10-CM | POA: Diagnosis not present

## 2015-09-11 NOTE — Assessment & Plan Note (Signed)
Decision today to treat with OMT was based on Physical Exam  After verbal consent patient was treated with HVLA, ME techniques in thoracici, lumbar and sacral areas  Patient tolerated the procedure well with improvement in symptoms  Patient given exercises, stretches and lifestyle modifications  See medications in patient instructions if given  Patient will follow up in when necessary

## 2015-09-11 NOTE — Assessment & Plan Note (Signed)
No significant spasm noted. Patient is doing very well. Encourage her to continue to work on core strength and home exercises. Patient will continue to try to make these differences and see me when she needs me for follow-up. It appears the patient needs every 3-6 months.

## 2015-09-11 NOTE — Assessment & Plan Note (Signed)
Discussed home exercises, icing protocol, topical anti-inflammatory some proper lifting techniques. The pain does not resolve within 4 weeks she will come back.

## 2015-09-11 NOTE — Patient Instructions (Addendum)
Good to see you as always!! Posterior interosseus nerve entrapment Ice is your friend 10 minutes after exercise Lift with thumb up or underhand pennsaid pinkie amount topically 2 times daily as needed.  Continue the effexor See me again when you need me.

## 2015-09-11 NOTE — Progress Notes (Signed)
[name] [place], [place] [address] Phone: [phone] Subjective:     CC: Back pain follow-up  [id number] Crystal [name] is a 35 y.o. female coming in lower back pain. Patient states that overall she is doing relatively well with her back pain. Patient has been doing relatively well. Patient is having some. Nothing that is out of the ordinary. Just feels that she needs some mild manipulation.  Patient does have a new symptom patient is stating that there is some right forearm pain. Seems to radiate towards her elbow. Denies any numbness or tingling.  .   Patient was having signs of some anxiety and depression at last visit. Patient was given Effexor for the generalized anxiety disorder as well as some hydroxyzine for breakthrough anxiety.  Patient w follow-up 150 mg. Patient statesas to increase her dose at last visit and is doing very well. No side effects. Very happy with the medicine. No signs of depression at this time.     Past medical history, social, surgical and family history all reviewed in electronic medical record.   Review of Systems: No headache, visual changes, nausea, vomiting, diarrhea, constipation, dizziness, abdominal pain, skin rash, fevers, chills, night sweats, weight loss, swollen lymph nodes, body aches, joint swelling, muscle aches, chest pain, shortness of breath, mood changes.   Objective Blood pressure 118/80, pulse 76, weight 148 lb (67.132 kg).  General: No apparent distress alert and oriented x3 mood and affect normal, dressed appropriately.  HEENT: Pupils equal, extraocular movements intact  Respiratory: Patient's speak in full sentences and does not appear short of breath  Cardiovascular: No lower extremity edema, non tender, no erythema  Skin: Warm dry intact with no signs of infection or rash on extremities or on axial skeleton.  Abdomen: Soft nontender  Neuro: Cranial nerves II through XII are  intact, neurovascularly intact in all extremities with 2+ DTRs and 2+ pulses.  Lymph: No lymphadenopathy of posterior or anterior cervical chain or axillae bilaterally.  Gait normal with good balance and coordination.  MSK:  Non tender with full range of motion and good stability and symmetric strength and tone of shoulders,  wrist, hip, knee and ankles bilaterally.  Elbow: Right Unremarkable to inspection. Range of motion full pronation, supination, flexion, extension. Strength is full to all of the above directions Stable to varus, valgus stress. Negative moving valgus stress test. Tender over the posterior interosseous nerve Ulnar nerve does not sublux. Negative cubital tunnel Tinel's. . Back Exam:  Inspection: Unremarkable  Motion: Flexion 45 deg, Extension 35 deg, Side Bending to 45 deg bilaterally,  Rotation to 45 deg bilaterally  SLR laying: Negative  XSLR laying: Negative  Palpable tenderness: Mild tenderness over the paraspinal musculature FABER: negative. Sensory change: Gross sensation intact to all lumbar and sacral dermatomes.  Reflexes: 2+ at both patellar tendons, 2+ at achilles tendons, Babinski's downgoing.  Strength at foot  Plantar-flexion: 5/5 Dorsi-flexion: 5/5 Eversion: 5/5 Inversion: 5/5  Leg strength  Quad: 5/5 Hamstring: 5/5 Hip flexor: 5/5 Hip abductors: 5/5  Gait unremarkable.    Osteopathic findings Cervical C4 flexed rotated and side bent left C2 flexed rotated and side bent right  T3 extended rotated and side bent right T5 extended rotated and side bent left  L2 flexed rotated and side bent right  Sacrum left on left     Impression and Recommendations:     This case required medical decision making of moderate complexity.

## 2015-09-11 NOTE — Assessment & Plan Note (Signed)
Patient is doing very well on the Effexor continues to

## 2015-09-24 ENCOUNTER — Ambulatory Visit (INDEPENDENT_AMBULATORY_CARE_PROVIDER_SITE_OTHER): Payer: 59 | Admitting: Family Medicine

## 2015-09-24 ENCOUNTER — Encounter: Payer: Self-pay | Admitting: Family Medicine

## 2015-09-24 ENCOUNTER — Telehealth: Payer: Self-pay | Admitting: Family Medicine

## 2015-09-24 VITALS — BP 118/80 | HR 78 | Ht 65.0 in | Wt 148.0 lb

## 2015-09-24 DIAGNOSIS — M999 Biomechanical lesion, unspecified: Secondary | ICD-10-CM

## 2015-09-24 DIAGNOSIS — M9903 Segmental and somatic dysfunction of lumbar region: Secondary | ICD-10-CM | POA: Diagnosis not present

## 2015-09-24 DIAGNOSIS — G5631 Lesion of radial nerve, right upper limb: Secondary | ICD-10-CM | POA: Diagnosis not present

## 2015-09-24 DIAGNOSIS — M9902 Segmental and somatic dysfunction of thoracic region: Secondary | ICD-10-CM

## 2015-09-24 DIAGNOSIS — M9904 Segmental and somatic dysfunction of sacral region: Secondary | ICD-10-CM | POA: Diagnosis not present

## 2015-09-24 DIAGNOSIS — M62830 Muscle spasm of back: Secondary | ICD-10-CM

## 2015-09-24 NOTE — Progress Notes (Signed)
Pre visit review using our clinic review tool, if applicable. No additional management support is needed unless otherwise documented below in the visit note. 

## 2015-09-24 NOTE — Patient Instructions (Addendum)
Good to see you Ice is your friend Wear compression sleeve may help See me again 10/17 as scheduled.

## 2015-09-24 NOTE — Telephone Encounter (Signed)
Patient would like to know if Dr. Tamala Julian can squeeze her in for an appointment today for a cortisone shot.  She is having severe right arm pain.

## 2015-09-24 NOTE — Telephone Encounter (Signed)
Spoke to pt, scheduled her for today @3 :30p

## 2015-09-24 NOTE — Telephone Encounter (Signed)
Fine, only injection No OMT anytime

## 2015-09-25 NOTE — Assessment & Plan Note (Signed)
Patient was injected today with complete resolution of pain. We discussed monitoring and over the course the next 48 hours. We discussed icing regimen. Patient should be able to increase her activity slowly. Patient continues to have some difficulty further imaging may be warranted. Patient will come back and see me again in 3-4 weeks for further evaluation and treatment.

## 2015-09-25 NOTE — Assessment & Plan Note (Signed)
Decision today to treat with OMT was based on Physical Exam  After verbal consent patient was treated with HVLA, ME techniques in thoracici, lumbar and sacral areas  Patient tolerated the procedure well with improvement in symptoms  Patient given exercises, stretches and lifestyle modifications  See medications in patient instructions if given  Patient will follow up in 3 weeks

## 2015-09-25 NOTE — Assessment & Plan Note (Signed)
Patient does have somewhat of back spasms. I think that this is secondary to her anxiety. Patient does have some stressful situations going on at this time. Patient will continue the same medications. I like to see her back though again in 3 weeks because she would likely need manipulation a more frequent basis during the stressful time.

## 2015-09-25 NOTE — Progress Notes (Signed)
[name] [place], [place] [address] Phone: [phone] Subjective:     CC: Right forearm pain follow-up, and back pain follow-up  [id number] Crystal [name] is a 34 y.o. female coming in right forearm pain follow-up. 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 Patient was doing better but is having some weakness. Patient states is becoming more difficult even to her daily activities and even her job. States sometimes she is unable to even extend her wrist. Patient is wanting something more this time.   Patient is also complaining of back pain. Has had this for quite some time. Nose that the anxiety seems to be exacerbating it. Has responded very well to osteopathic manipulation previously. .   Patient was having signs of some anxiety and depression at last visit. Patient was given Effexor for the generalized anxiety disorder as well as some hydroxyzine for breakthrough anxiety.  Patient w follow-up 150 mg. has been doing very well overall but unfortunately has some stress due to a lawsuit. This is causing her to have significant more anxiety than she's had recently.  Past medical history, social, surgical and family history all reviewed in electronic medical record.   Review of Systems: No headache, visual changes, nausea, vomiting, diarrhea, constipation, dizziness, abdominal pain, skin rash, fevers, chills, night sweats, weight loss, swollen lymph nodes, body aches, joint swelling, muscle aches, chest pain, shortness of breath, mood changes.   Objective Blood pressure 118/80, pulse 78, height 5\' 5"  (1.651 m), weight 148 lb (67.132 kg), SpO2 97 %.  General: No apparent distress alert and oriented x3 mood and affect normal, dressed appropriately.  HEENT: Pupils equal, extraocular movements intact  Respiratory: Patient's speak in full sentences and does not appear short of breath  Cardiovascular: No lower  extremity edema, non tender, no erythema  Skin: Warm dry intact with no signs of infection or rash on extremities or on axial skeleton.  Abdomen: Soft nontender  Neuro: Cranial nerves II through XII are intact, neurovascularly intact in all extremities with 2+ DTRs and 2+ pulses.  Lymph: No lymphadenopathy of posterior or anterior cervical chain or axillae bilaterally.  Gait normal with good balance and coordination.  MSK:  Non tender with full range of motion and good stability and symmetric strength and tone of shoulders,  wrist, hip, knee and ankles bilaterally.  Elbow: Right Unremarkable to inspection. Range of motion full pronation, supination, flexion, extension. Strength is full to all of the above directions Stable to varus, valgus stress. Negative moving valgus stress test. Tender over the posterior interosseous nerve and severely increased from previous exam mild increase in pain with extension of the wrist as well. Ulnar nerve does not sublux. Negative cubital tunnel Tinel's. . Back Exam:  Inspection: Unremarkable  Motion: Flexion 45 deg, Extension 35 deg, Side Bending to 45 deg bilaterally,  Rotation to 45 deg bilaterally  SLR laying: Negative  XSLR laying: Negative  Palpable tenderness: Moderate tenderness noted with increasing tightness of musculature. FABER: negative. Sensory change: Gross sensation intact to all lumbar and sacral dermatomes.  Reflexes: 2+ at both patellar tendons, 2+ at achilles tendons, Babinski's downgoing.  Strength at foot  Plantar-flexion: 5/5 Dorsi-flexion: 5/5 Eversion: 5/5 Inversion: 5/5  Leg strength  Quad: 5/5 Hamstring: 5/5 Hip flexor: 5/5 Hip abductors: 5/5  Gait unremarkable.    Osteopathic findings Cervical C4 flexed rotated and side bent left C2 flexed rotated and side bent right  T3 extended  rotated and side bent right T5 extended rotated and side bent left  L2 flexed rotated and side bent right  Sacrum left on  left  Procedure note After verbal consent patient was prepped with alcohol swabs over the most tender point in the forearm on the dorsal aspect. Patient then was injected with a 25-gauge 1 inch needle with a total of 0.5 mL of 0.5% Marcaine and 0.5 mL of Kenalog 40 mg/dL. Patient tolerated the procedure well with no blood loss. Post injection instructions given.   Impression and Recommendations:     This case required medical decision making of moderate complexity.

## 2015-09-28 ENCOUNTER — Ambulatory Visit: Payer: 59 | Admitting: Family Medicine

## 2015-10-12 ENCOUNTER — Encounter: Payer: Self-pay | Admitting: Family Medicine

## 2015-10-12 ENCOUNTER — Ambulatory Visit (INDEPENDENT_AMBULATORY_CARE_PROVIDER_SITE_OTHER): Payer: 59 | Admitting: Family Medicine

## 2015-10-12 VITALS — BP 122/84 | HR 86 | Ht 65.0 in | Wt 148.0 lb

## 2015-10-12 DIAGNOSIS — M9903 Segmental and somatic dysfunction of lumbar region: Secondary | ICD-10-CM | POA: Diagnosis not present

## 2015-10-12 DIAGNOSIS — M999 Biomechanical lesion, unspecified: Secondary | ICD-10-CM

## 2015-10-12 DIAGNOSIS — M9902 Segmental and somatic dysfunction of thoracic region: Secondary | ICD-10-CM | POA: Diagnosis not present

## 2015-10-12 DIAGNOSIS — M9904 Segmental and somatic dysfunction of sacral region: Secondary | ICD-10-CM

## 2015-10-12 DIAGNOSIS — M62830 Muscle spasm of back: Secondary | ICD-10-CM | POA: Diagnosis not present

## 2015-10-12 NOTE — Progress Notes (Signed)
[name] [place], [place] [address] Phone: [phone] Subjective:     CC: Right forearm pain follow-up, and back pain follow-up  [id number] Crystal [name] is a 34 y.o. female coming in right forearm pain follow-up. 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 Patient was non-corresponding or seem to be improving patient states that everyday seems to be better. Not having the radicular pain that she is having previously. Mild burning but otherwise unremarkable.  Patient is also seen me for back pain. States that there is some increasing and stiffness. Seems to be doing relatively well but has not been doing the exercises and has not been working out regularly. Denies any new symptoms. Feels tighter than she has been though over the course last 2 weeks.  Patient was having signs of some anxiety and depression at last visit. Patient was given Effexor for the generalized anxiety disorder as well as some hydroxyzine for breakthrough anxiety. 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 Patient exhibits at this will help with some of her anxiety. Continues to do well with the medication.  Past medical history, social, surgical and family history all reviewed in electronic medical record.   Review of Systems: No headache, visual changes, nausea, vomiting, diarrhea, constipation, dizziness, abdominal pain, skin rash, fevers, chills, night sweats, weight loss, swollen lymph nodes, body aches, joint swelling, muscle aches, chest pain, shortness of breath, mood changes.   Objective Blood pressure 122/84, pulse 86, height 5\' 5"  (1.651 m), weight 148 lb (67.132 kg), SpO2 98 %.  General: No apparent distress alert and oriented x3 mood and affect normal, dressed appropriately.  HEENT: Pupils equal, extraocular movements intact  Respiratory: Patient's speak in full sentences and does not appear short of breath   Cardiovascular: No lower extremity edema, non tender, no erythema  Skin: Warm dry intact with no signs of infection or rash on extremities or on axial skeleton.  Abdomen: Soft nontender  Neuro: Cranial nerves II through XII are intact, neurovascularly intact in all extremities with 2+ DTRs and 2+ pulses.  Lymph: No lymphadenopathy of posterior or anterior cervical chain or axillae bilaterally.  Gait normal with good balance and coordination.  MSK:  Non tender with full range of motion and good stability and symmetric strength and tone of shoulders,  wrist, hip, knee and ankles bilaterally.  Elbow: Right Unremarkable to inspection. Range of motion full pronation, supination, flexion, extension. Strength is full to all of the above directions Stable to varus, valgus stress. Negative moving valgus stress test. Minimal tenderness over the interosseous nerve in the right side Ulnar nerve does not sublux. Negative cubital tunnel Tinel's. . Back Exam:  Inspection: Unremarkable  Motion: Flexion 45 deg, Extension 35 deg, Side Bending to 45 deg bilaterally,  Rotation to 45 deg bilaterally  SLR laying: Negative  XSLR laying: Negative  Palpable tenderness: Mild to moderate tenderness of the per spinal musculature. FABER: negative. Sensory change: Gross sensation intact to all lumbar and sacral dermatomes.  Reflexes: 2+ at both patellar tendons, 2+ at achilles tendons, Babinski's downgoing.  Strength at foot  Plantar-flexion: 5/5 Dorsi-flexion: 5/5 Eversion: 5/5 Inversion: 5/5  Leg strength  Quad: 5/5 Hamstring: 5/5 Hip flexor: 5/5 Hip abductors: 5/5  Gait unremarkable.   Osteopathic findings Cervical C2 flexed rotated and side bent right  T3 extended rotated and side bent right T8 extended rotated and side bent left  L2 flexed  rotated and side bent right L4 flexed rotated and side bent right  Sacrum left on left     Impression and Recommendations:     This case required medical  decision making of moderate complexity.

## 2015-10-12 NOTE — Progress Notes (Signed)
Pre visit review using our clinic review tool, if applicable. No additional management support is needed unless otherwise documented below in the visit note. 

## 2015-10-12 NOTE — Assessment & Plan Note (Signed)
Decision today to treat with OMT was based on Physical Exam  After verbal consent patient was treated with HVLA, ME techniques in thoracici, lumbar and sacral areas  Patient tolerated the procedure well with improvement in symptoms  Patient given exercises, stretches and lifestyle modifications  See medications in patient instructions if given  Patient will follow up in 4 weeks        

## 2015-10-12 NOTE — Assessment & Plan Note (Signed)
Patient has been doing much better overall. Patient continue with the same medications. I do think that sometimes anxiety plays role. We discussed icing regimen as well as strain to work on her core strengthening again. Patient come back and see me again in 4 weeks for further evaluation and treatment.

## 2015-10-12 NOTE — Patient Instructions (Signed)
Great to see you Treat yourself next weekend! Ice is your friend Keep taking care of your self I wish you the best! See me again in 4 weeks.

## 2015-11-10 ENCOUNTER — Encounter: Payer: Self-pay | Admitting: Family Medicine

## 2015-11-10 ENCOUNTER — Other Ambulatory Visit: Payer: Self-pay | Admitting: Family Medicine

## 2015-11-10 ENCOUNTER — Ambulatory Visit (INDEPENDENT_AMBULATORY_CARE_PROVIDER_SITE_OTHER): Payer: 59 | Admitting: Family Medicine

## 2015-11-10 VITALS — BP 118/80 | HR 92 | Wt 150.0 lb

## 2015-11-10 DIAGNOSIS — R591 Generalized enlarged lymph nodes: Secondary | ICD-10-CM

## 2015-11-10 DIAGNOSIS — M9901 Segmental and somatic dysfunction of cervical region: Secondary | ICD-10-CM | POA: Diagnosis not present

## 2015-11-10 DIAGNOSIS — M9903 Segmental and somatic dysfunction of lumbar region: Secondary | ICD-10-CM | POA: Diagnosis not present

## 2015-11-10 DIAGNOSIS — M62830 Muscle spasm of back: Secondary | ICD-10-CM

## 2015-11-10 DIAGNOSIS — M9902 Segmental and somatic dysfunction of thoracic region: Secondary | ICD-10-CM

## 2015-11-10 DIAGNOSIS — M999 Biomechanical lesion, unspecified: Secondary | ICD-10-CM

## 2015-11-10 DIAGNOSIS — M9904 Segmental and somatic dysfunction of sacral region: Secondary | ICD-10-CM | POA: Diagnosis not present

## 2015-11-10 MED ORDER — AMOXICILLIN-POT CLAVULANATE 875-125 MG PO TABS: 1.0000 | ORAL_TABLET | Freq: Two times a day (BID) | ORAL | Status: DC

## 2015-11-10 MED ORDER — MAGIC MOUTHWASH W/LIDOCAINE: 10.0000 mL | Freq: Four times a day (QID) | ORAL | Status: DC

## 2015-11-10 NOTE — Patient Instructions (Signed)
Keep those nurses away from you Stay active Augmentin and magic mouthwash See me again in 3 weeks Have a great trip

## 2015-11-10 NOTE — Progress Notes (Signed)
  [name], [place] [address] Phone: [phone] Subjective:     CC: Right forearm pain follow-up, and back pain follow-up  [id number] Crystal [name] is a 34 y.o. female coming in right forearm pain follow-up. Forearm is feeling significant a better after injection.  Patient does have a new problem. Patient states that she had some sores that were significantly tender on her tongue. Patient's at work attempted to treat with silver nitrate. Since then the pain is worse. This happened 24 hours ago. No fevers no chills and no bleeding noted.  Patient is also seen me for back pain. Had one flare. States that it seems to be improving. Still tight than usual. When she works multiple days in a row she has more discomfort. No radicular symptoms or weakness..  Increasing stress at work is causing some more anxiety as well. Continuing stay medications.  Past medical history, social, surgical and family history all reviewed in electronic medical record.   Review of Systems: No headache, visual changes, nausea, vomiting, diarrhea, constipation, dizziness, abdominal pain, skin rash, fevers, chills, night sweats, weight loss, swollen lymph nodes, body aches, joint swelling, muscle aches, chest pain, shortness of breath, mood changes.   Objective Blood pressure 118/80, pulse 92, weight 150 lb (68.04 kg), SpO2 99 %.  General: No apparent distress alert and oriented x3 mood and affect normal, dressed appropriately.  HEENT: Pupils equal, extraocular movements intact . 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 No significant erythema noted. No buccal involvement. Respiratory: Patient's speak in full sentences and does not appear short of breath  Cardiovascular: No lower extremity edema, non tender, no erythema  Skin: Warm dry intact with no signs of infection or rash on extremities or on  axial skeleton.  Abdomen: Soft nontender  Neuro: Cranial nerves II through XII are intact, neurovascularly intact in all extremities with 2+ DTRs and 2+ pulses.  Lymph: Positive anterior chain lymphadenopathy bilaterally moderately tender lymph nodes noted. Gait normal with good balance and coordination.  MSK:  Non tender with full range of motion and good stability and symmetric strength and tone of shoulders,  wrist, hip, knee and ankles bilaterally.   . Back Exam:  Inspection: Unremarkable  Motion: Flexion 45 deg, Extension 35 deg, Side Bending to 45 deg bilaterally,  Rotation to 45 deg bilaterally  SLR laying: Negative  XSLR laying: Negative  Palpable tenderness: Mild to moderate tenderness of the per spinal musculature around scapula and lumbar spine.  FABER: negative. Sensory change: Gross sensation intact to all lumbar and sacral dermatomes.  Reflexes: 2+ at both patellar tendons, 2+ at achilles tendons, Babinski's downgoing.  Strength at foot  Plantar-flexion: 5/5 Dorsi-flexion: 5/5 Eversion: 5/5 Inversion: 5/5  Leg strength  Quad: 5/5 Hamstring: 5/5 Hip flexor: 5/5 Hip abductors: 5/5  Gait unremarkable.   Osteopathic findings Cervical C2 flexed rotated and side bent right  T3 extended rotated and side bent right T8 extended rotated and side bent left  L2 flexed rotated and side bent right L4 flexed rotated and side bent right  Sacrum left on left     Impression and Recommendations:     This case required medical decision making of moderate complexity.

## 2015-11-10 NOTE — Telephone Encounter (Signed)
Refill done.  

## 2015-11-10 NOTE — Assessment & Plan Note (Addendum)
Decision today to treat with OMT was based on Physical Exam  After verbal consent patient was treated with HVLA, ME techniques in thoracici, lumbar and sacral areas  Patient tolerated the procedure well with improvement in symptoms  Patient given exercises, stretches and lifestyle modifications  See medications in patient instructions if given  Patient will follow up in 4-6 weeks    Lymphadenopathy likely secondary to a post viral bacterial infection at this time. We have treated with Augmentin. Warned of potential side effects. 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

## 2015-11-10 NOTE — Assessment & Plan Note (Signed)
Decision today to treat with OMT was based on Physical Exam  After verbal consent patient was treated with HVLA, ME techniques in thoracici, lumbar and sacral areas  Patient tolerated the procedure well with improvement in symptoms  Patient given exercises, stretches and lifestyle modifications  See medications in patient instructions if given  Patient will follow up in 4 weeks        

## 2015-11-10 NOTE — Assessment & Plan Note (Signed)
Likely secondary to overuse. We discussed with patient to continue to do the exercises and work on stretching the hip flexor. Continue to keep patient active. We discussed icing regimen. Patient will come back and see me again after her travels for the holiday season.

## 2015-11-10 NOTE — Assessment & Plan Note (Signed)
P 

## 2015-11-10 NOTE — Progress Notes (Signed)
Pre visit review using our clinic review tool, if applicable. No additional management support is needed unless otherwise documented below in the visit note. 

## 2015-11-30 ENCOUNTER — Ambulatory Visit: Payer: 59 | Admitting: Family Medicine

## 2016-01-21 DIAGNOSIS — N920 Excessive and frequent menstruation with regular cycle: Secondary | ICD-10-CM | POA: Diagnosis not present

## 2016-01-21 MED FILL — VENLAFAXINE HCL ER 150 MG C: 150 | 30 days supply | Qty: 30 | Fill #2

## 2016-01-22 MED FILL — AMPHETAMINE SALTS 15 MG TAB: 15 | 30 days supply | Qty: 30 | Fill #0

## 2016-01-22 MED FILL — AMPHETAMINE SALTS 5 MG TAB: 5 | 15 days supply | Qty: 30 | Fill #0

## 2016-02-15 ENCOUNTER — Ambulatory Visit (INDEPENDENT_AMBULATORY_CARE_PROVIDER_SITE_OTHER): Payer: 59 | Admitting: Family Medicine

## 2016-02-15 ENCOUNTER — Encounter: Payer: Self-pay | Admitting: Family Medicine

## 2016-02-15 VITALS — BP 124/82 | HR 107 | Wt 151.0 lb

## 2016-02-15 DIAGNOSIS — M9904 Segmental and somatic dysfunction of sacral region: Secondary | ICD-10-CM

## 2016-02-15 DIAGNOSIS — M9903 Segmental and somatic dysfunction of lumbar region: Secondary | ICD-10-CM | POA: Diagnosis not present

## 2016-02-15 DIAGNOSIS — F411 Generalized anxiety disorder: Secondary | ICD-10-CM

## 2016-02-15 DIAGNOSIS — M9902 Segmental and somatic dysfunction of thoracic region: Secondary | ICD-10-CM

## 2016-02-15 DIAGNOSIS — F329 Major depressive disorder, single episode, unspecified: Secondary | ICD-10-CM | POA: Diagnosis not present

## 2016-02-15 DIAGNOSIS — M62830 Muscle spasm of back: Secondary | ICD-10-CM | POA: Diagnosis not present

## 2016-02-15 DIAGNOSIS — F32A Depression, unspecified: Secondary | ICD-10-CM

## 2016-02-15 DIAGNOSIS — M999 Biomechanical lesion, unspecified: Secondary | ICD-10-CM

## 2016-02-15 MED ORDER — LEVOTHYROXINE SODIUM 50 MCG PO TABS: 50.0000 ug | ORAL_TABLET | Freq: Every day | ORAL | Status: DC

## 2016-02-15 MED FILL — LEVOTHYROXINE 50 MCG TABLET: 50 | 30 days supply | Qty: 30 | Fill #0

## 2016-02-15 NOTE — Assessment & Plan Note (Signed)
Decision today to treat with OMT was based on Physical Exam  After verbal consent patient was treated with HVLA, ME techniques in thoracici, lumbar and sacral areas  Patient tolerated the procedure well with improvement in symptoms  Patient given exercises, stretches and lifestyle modifications  See medications in patient instructions if given  Patient will follow up in 2-3 weeks

## 2016-02-15 NOTE — Assessment & Plan Note (Signed)
Patient back spasm 100 Mc somatic. Patient did respond well to the Effexor previously but is having difficulty at this time. I do think that the underlying depression and anxiety is giving her more difficulty at this time. Referred to psychology for further evaluation. Patient is looking forward to this. We also started patient on a very low dose of Synthroid as an adjunct therapy. Patient has had difficult he with weight loss. Patient states recent TSH in an outside facility was 4.4. We will attempt to get these labs. Patient will follow-up again with me in 2-3 weeks.

## 2016-02-15 NOTE — Progress Notes (Signed)
Pre visit review using our clinic review tool, if applicable. No additional management support is needed unless otherwise documented below in the visit note. 
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
## 2016-02-15 NOTE — Patient Instructions (Signed)
Good to see you  I am sorry you are not feeling good Dr. Cheryln Manly will be calling you and can help Synthroid 77mc daily can help Continue the effexor but he may change you if he needs to.  See me again in 2-3 weeks.

## 2016-02-15 NOTE — Assessment & Plan Note (Signed)
Adjunct therapy with Synthroid at this time. Did not want to change Effexor when discussing with patient. Patient may need to change of medication in the near future though. Patient and will come back and see me again in 2-3 weeks. Referred to psychology today.

## 2016-02-22 MED FILL — VENLAFAXINE HCL ER 150 MG C: 150 | 30 days supply | Qty: 30 | Fill #3

## 2016-02-23 MED FILL — AMPHETAMINE SALTS 5 MG TAB: 5 | 15 days supply | Qty: 30 | Fill #0

## 2016-02-23 MED FILL — AMPHETAMINE SALTS 15 MG TAB: 15 | 30 days supply | Qty: 30 | Fill #0

## 2016-03-14 ENCOUNTER — Ambulatory Visit (INDEPENDENT_AMBULATORY_CARE_PROVIDER_SITE_OTHER): Payer: 59 | Admitting: Family Medicine

## 2016-03-14 ENCOUNTER — Encounter: Payer: Self-pay | Admitting: Family Medicine

## 2016-03-14 VITALS — BP 128/84 | HR 89 | Ht 65.0 in

## 2016-03-14 DIAGNOSIS — M9904 Segmental and somatic dysfunction of sacral region: Secondary | ICD-10-CM | POA: Diagnosis not present

## 2016-03-14 DIAGNOSIS — M9903 Segmental and somatic dysfunction of lumbar region: Secondary | ICD-10-CM

## 2016-03-14 DIAGNOSIS — M62830 Muscle spasm of back: Secondary | ICD-10-CM

## 2016-03-14 DIAGNOSIS — M999 Biomechanical lesion, unspecified: Secondary | ICD-10-CM

## 2016-03-14 DIAGNOSIS — M9902 Segmental and somatic dysfunction of thoracic region: Secondary | ICD-10-CM

## 2016-03-14 DIAGNOSIS — F411 Generalized anxiety disorder: Secondary | ICD-10-CM

## 2016-03-14 MED FILL — LEVOTHYROXINE 50 MCG TABLET: 50 | 30 days supply | Qty: 30 | Fill #1

## 2016-03-14 NOTE — Assessment & Plan Note (Signed)
Continue the Effexor as well as a low dose Synthroid. We should recheck TSH level in 6 weeks' time.

## 2016-03-14 NOTE — Progress Notes (Signed)
     [name] [address], [place] [address] Phone: [phone] Subjective:     CC: Neck pain and anxiety f/u   [id number] Crystal [name] is a 35 y.o. female coming in right forearm pain follow-up.     Patient is also seen me for back pain. Patient is having some mild increasing tightness. Patient did doing exercises until some pain that seems to be more on the right buttocks area. Seems to radiate up towards her back. Mild tightness of the hamstring. Has  started well to osteopathic manipulation previously.  Increasing stress at work is causing some more anxiety as well. Patient is on Effexor. Was given a Synthroid low dose. An states that she is feeling a little bit better. Patient is also still having some marriage difficulty. Hopefully going to manage counseling and under future. Referral has been placed.  Past medical history, social, surgical and family history all reviewed in electronic medical record.   Review of Systems: No headache, visual changes, nausea, vomiting, diarrhea, constipation, dizziness, abdominal pain, skin rash, fevers, chills, night sweats, weight loss, swollen lymph nodes, body aches, joint swelling, muscle aches, chest pain, shortness of breath, mood changes.   Objective Blood pressure 128/84, pulse 89, height 5\' 5"  (1.651 m), SpO2 98 %.  General: No apparent distress alert and oriented x3 mood and affect a little depressed, dressed appropriately.  HEENT: Pupils equal, extraocular movements intact . 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 No significant erythema noted. No buccal involvement. Respiratory: Patient's speak in full sentences and does not appear short of breath  Cardiovascular: No lower extremity edema, non tender, no erythema  Skin: Warm dry intact with no signs of infection or rash on extremities or on axial skeleton.  Abdomen: Soft  nontender  Neuro: Cranial nerves II through XII are intact, neurovascularly intact in all extremities with 2+ DTRs and 2+ pulses.  Lymph: Positive anterior chain lymphadenopathy bilaterally moderately tender lymph nodes noted. Gait normal with good balance and coordination.  MSK:  Non tender with full range of motion and good stability and symmetric strength and tone of shoulders,  wrist, hip, knee and ankles bilaterally.   . Back Exam:  Inspection: Unremarkable  Motion: Flexion 45 deg, Extension 35 deg, Side Bending to 45 deg bilaterally,  Rotation to 45 deg bilaterally  SLR laying: Negative  XSLR laying: Negative  Palpable tenderness: Nontender in the paraspinal musculature of the lumbar spine. Point tenderness over the right ischial tuberosity than previous exam FABER: negative. Sensory change: Gross sensation intact to all lumbar and sacral dermatomes.  Reflexes: 2+ at both patellar tendons, 2+ at achilles tendons, Babinski's downgoing.  Strength at foot  Plantar-flexion: 5/5 Dorsi-flexion: 5/5 Eversion: 5/5 Inversion: 5/5  Leg strength  Quad: 5/5 Hamstring: 5/5 but does have tightness on the right side compared to the contralateral side Hip flexor: 5/5 Hip abductors: 4/5  Gait unremarkable.   Osteopathic findings Cervical C2 flexed rotated and side bent right  T3 extended rotated and side bent right T7 extended rotated and side bent left  L2 flexed rotated and side bent right  Sacrum left on left    pelvic shear noted right posterior ilium Impression and Recommendations:     This case required medical decision making of moderate complexity.

## 2016-03-14 NOTE — Assessment & Plan Note (Signed)
Having any significant spasm at this time the patient does more tightness of her hamstrings. Encourage her to do some different exercises and we discussed compression sleeve. We discussed icing regimen. We discussed which activities to do an which was to avoid. Discussed proper shoes. Patient will follow-up again in 3 weeks for further evaluation and treatment.

## 2016-03-14 NOTE — Patient Instructions (Addendum)
Good to see you  You are doing great  Oveall keep it up  We can recheck tsh in 6 weeks See me again in 3 weeks

## 2016-03-14 NOTE — Progress Notes (Signed)
Pre visit review using our clinic review tool, if applicable. No additional management support is needed unless otherwise documented below in the visit note. 

## 2016-03-14 NOTE — Assessment & Plan Note (Signed)
Decision today to treat with OMT was based on Physical Exam  After verbal consent patient was treated with HVLA, ME techniques in thoracici, lumbar and sacral areas  Patient tolerated the procedure well with improvement in symptoms  Patient given exercises, stretches and lifestyle modifications  See medications in patient instructions if given  Patient will follow up in 3 weeks  

## 2016-03-22 ENCOUNTER — Telehealth: Payer: Self-pay | Admitting: Family Medicine

## 2016-03-22 NOTE — Telephone Encounter (Signed)
Referral was entered. Phone # to office given to pt.

## 2016-03-22 NOTE — Telephone Encounter (Signed)
Pt stated that Dr. Tamala Julian was going to refer her out to a psychology since the ov on 02/15/16. Please check, no order in the system.

## 2016-03-23 MED FILL — VENLAFAXINE HCL ER 150 MG C: 150 | 30 days supply | Qty: 30 | Fill #4

## 2016-03-29 MED FILL — AMOXICILLIN 500 MG CAPSULE: 500 | 14 days supply | Qty: 28 | Fill #0

## 2016-03-31 ENCOUNTER — Encounter: Payer: Self-pay | Admitting: Family Medicine

## 2016-03-31 ENCOUNTER — Ambulatory Visit (INDEPENDENT_AMBULATORY_CARE_PROVIDER_SITE_OTHER): Payer: 59 | Admitting: Family Medicine

## 2016-03-31 VITALS — BP 118/82 | HR 89 | Ht 65.0 in

## 2016-03-31 DIAGNOSIS — F411 Generalized anxiety disorder: Secondary | ICD-10-CM | POA: Diagnosis not present

## 2016-03-31 DIAGNOSIS — M9904 Segmental and somatic dysfunction of sacral region: Secondary | ICD-10-CM

## 2016-03-31 DIAGNOSIS — M9902 Segmental and somatic dysfunction of thoracic region: Secondary | ICD-10-CM

## 2016-03-31 DIAGNOSIS — M62830 Muscle spasm of back: Secondary | ICD-10-CM

## 2016-03-31 DIAGNOSIS — M9903 Segmental and somatic dysfunction of lumbar region: Secondary | ICD-10-CM

## 2016-03-31 DIAGNOSIS — M999 Biomechanical lesion, unspecified: Secondary | ICD-10-CM

## 2016-03-31 MED FILL — DEXTROAMP-AMPHETAMINE 5 MG: 5 | 15 days supply | Qty: 30 | Fill #0

## 2016-03-31 MED FILL — AMPHETAMINE SALTS 15 MG TAB: 15 | 30 days supply | Qty: 30 | Fill #0

## 2016-03-31 NOTE — Progress Notes (Signed)
[name] [address], [place] [address] Phone: [phone] Subjective:     CC: Neck pain and anxiety f/u   [id number] Maddyn Shird Hank is a 35 y.o. female coming in right forearm pain follow-up.     Patient is also seen me for back pain. Patient has not been working out at all. At this time seems to be dome having more stiffness and she visited because she is having more weakness.pain seems to be more localized to the right side of the hip. Seems to be giving her more trouble with regular daily activities even. Patient states that that when she tries to increase her activity she seems to be doing relatively well.  Continues to have stress in her life as well as at work. Patient states that the Effexor has helped overall. Patient is still looking to start marriage counseling in the near future.  Past Medical History  Diagnosis Date  . Allergy   . Asthma    Past Surgical History  Procedure Laterality Date  . Skin cancer excision    . Hysteroscopy with novasure N/A 05/23/2014    Procedure: Velvet Bathe;  Surgeon: Cheri Fowler, MD;  Location: [facility];  Service: Gynecology;  Laterality: N/A;   Social History  Substance Use Topics  . Smoking status: Never Smoker   . Smokeless tobacco: Never Used  . Alcohol Use: Yes   Allergies  Allergen Reactions  . Dextromethorphan Hbr Rash   Family History  Problem Relation Age of Onset  . Fibromyalgia Father   . Autoimmune disease Father   . Cancer Maternal Grandmother     stomach  . Diabetes Maternal Grandmother   . Diabetes Paternal Grandmother   . Heart disease Paternal Grandmother   . Alcohol abuse Paternal Grandfather   . Cancer Paternal Grandfather     lung     Past medical history, social, surgical and family history all reviewed in electronic medical record.   Review of Systems: No headache, visual changes, nausea, vomiting, diarrhea, constipation, dizziness, abdominal pain,  skin rash, fevers, chills, night sweats, weight loss, swollen lymph nodes, body aches, joint swelling, muscle aches, chest pain, shortness of breath, mood changes.   Objective Blood pressure 118/82, pulse 89, height 5\' 5"  (1.651 m), SpO2 95 %.  General: No apparent distress alert and oriented x3 mood and affect a little depressed, dressed appropriately.  HEENT: Pupils equal, extraocular movements intact . 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 No significant erythema noted. No buccal involvement. Respiratory: Patient's speak in full sentences and does not appear short of breath  Cardiovascular: No lower extremity edema, non tender, no erythema  Skin: Warm dry intact with no signs of infection or rash on extremities or on axial skeleton.  Abdomen: Soft nontender  Neuro: Cranial nerves II through XII are intact, neurovascularly intact in all extremities with 2+ DTRs and 2+ pulses.  Lymph: Positive anterior chain lymphadenopathy bilaterally moderately tender lymph nodes noted. Gait normal with good balance and coordination.  MSK:  Non tender with full range of motion and good stability and symmetric strength and tone of shoulders,  wrist, hip, knee and ankles bilaterally.   . Back Exam:  Inspection: Unremarkable  Motion: Flexion 45 deg, Extension 35 deg, Side Bending to 45 deg bilaterally,  Rotation to 45 deg bilaterally  SLR laying: Negative  XSLR laying: Negative  Palpable tenderness:  more pain over the right ischial tuberosity and then previous exam. FABER: negative. Sensory change: Gross sensation intact to all lumbar and sacral dermatomes.  Reflexes: 2+ at both patellar tendons, 2+ at achilles tendons, Babinski's downgoing.  Strength at foot  Plantar-flexion: 5/5 Dorsi-flexion: 5/5 Eversion: 5/5 Inversion: 5/5  Leg strength  Quad: 5/5 Hamstring: 5/5 but does have tightness on the right side compared to the contralateral  side Hip flexorstill present with no significant change: 5/5 Hip abductors: 4/5  Gait unremarkable.   Osteopathic findings Cervical C2 flexed rotated and side bent right  T3 extended rotated and side bent right T7 extended rotated and side bent left  L2 flexed rotated and side bent right  Sacrum left on left    pelvic shear noted right posterior ilium Impression and Recommendations:     This case required medical decision making of moderate complexity.

## 2016-03-31 NOTE — Assessment & Plan Note (Signed)
Decision today to treat with OMT was based on Physical Exam  After verbal consent patient was treated with HVLA, ME techniques in thoracici, lumbar and sacral areas  Patient tolerated the procedure well with improvement in symptoms  Patient given exercises, stretches and lifestyle modifications  See medications in patient instructions if given  Patient will follow up in 3-4 weeks

## 2016-03-31 NOTE — Assessment & Plan Note (Signed)
Encourage patient to continue the same medication.

## 2016-03-31 NOTE — Assessment & Plan Note (Signed)
Patient continues to have some mild soreness in the back. I do not think that any further imaging is necessary. Is responding fairly well to osteopathic manipulation. We'll get her to start going to the exercise classes on a more regular basis. Patient has medications for breakthrough pain. Patient follow-up again in 4 weeks for further evaluation and treatment.

## 2016-03-31 NOTE — Patient Instructions (Signed)
Good to see you  Get back in the gym! 50% and increase 10% a week in duration and intensity  Thigh compression could help Careful in the swing See me again in 4 weeks

## 2016-03-31 NOTE — Progress Notes (Signed)
Pre visit review using our clinic review tool, if applicable. No additional management support is needed unless otherwise documented below in the visit note. 

## 2016-04-14 MED FILL — LEVOTHYROXINE 50 MCG TABLET: 50 | 30 days supply | Qty: 30 | Fill #2

## 2016-04-23 MED FILL — VENLAFAXINE HCL ER 150 MG C: 150 | 30 days supply | Qty: 30 | Fill #5

## 2016-04-26 ENCOUNTER — Ambulatory Visit: Payer: 59 | Admitting: Family Medicine

## 2016-04-27 ENCOUNTER — Encounter: Payer: Self-pay | Admitting: *Deleted

## 2016-04-27 ENCOUNTER — Ambulatory Visit (INDEPENDENT_AMBULATORY_CARE_PROVIDER_SITE_OTHER): Payer: 59 | Admitting: Family Medicine

## 2016-04-27 ENCOUNTER — Encounter: Payer: Self-pay | Admitting: Family Medicine

## 2016-04-27 VITALS — BP 118/80 | HR 118

## 2016-04-27 DIAGNOSIS — M9902 Segmental and somatic dysfunction of thoracic region: Secondary | ICD-10-CM | POA: Diagnosis not present

## 2016-04-27 DIAGNOSIS — M533 Sacrococcygeal disorders, not elsewhere classified: Secondary | ICD-10-CM | POA: Diagnosis not present

## 2016-04-27 DIAGNOSIS — M9904 Segmental and somatic dysfunction of sacral region: Secondary | ICD-10-CM

## 2016-04-27 DIAGNOSIS — M999 Biomechanical lesion, unspecified: Secondary | ICD-10-CM

## 2016-04-27 DIAGNOSIS — M9903 Segmental and somatic dysfunction of lumbar region: Secondary | ICD-10-CM

## 2016-04-27 DIAGNOSIS — F411 Generalized anxiety disorder: Secondary | ICD-10-CM

## 2016-04-27 NOTE — Progress Notes (Signed)
Pre visit review using our clinic review tool, if applicable. No additional management support is needed unless otherwise documented below in the visit note. 

## 2016-04-27 NOTE — Progress Notes (Signed)
[name] [name], [place] [address] Phone: [phone] Subjective:     CC: Neck pain and anxiety f/u   [id number] Crystal [name] is a 35 y.o. female coming in right fsacroiliac pain. Patient continues to lack of  Motivation to work out on a regular basis. Having increasing intensity in the back pain. Starting to affect her job even. Having increasing anxiety and stress as well. Continues to take the Effexor but is maxed on his toes. States that it has helped overall but still feeling overwhelmed overall. Feels that some of her back is related to this. Pain seems to be wrapping around the ischial bursa area on the right side as well.   Continues to have stress in her life as well as at work. Patient states that the Effexor has helped overall. Patient is still looking to start marriage counseling in the near future.  Past Medical History  Diagnosis Date  . Allergy   . Asthma    Past Surgical History  Procedure Laterality Date  . Skin cancer excision    . Hysteroscopy with novasure N/A 05/23/2014    Procedure: Velvet Bathe;  Surgeon: Cheri Fowler, MD;  Location: [facility];  Service: Gynecology;  Laterality: N/A;   Social History  Substance Use Topics  . Smoking status: Never Smoker   . Smokeless tobacco: Never Used  . Alcohol Use: Yes   Allergies  Allergen Reactions  . Dextromethorphan Hbr Rash   Family History  Problem Relation Age of Onset  . Fibromyalgia Father   . Autoimmune disease Father   . Cancer Maternal Grandmother     stomach  . Diabetes Maternal Grandmother   . Diabetes Paternal Grandmother   . Heart disease Paternal Grandmother   . Alcohol abuse Paternal Grandfather   . Cancer Paternal Grandfather     lung     Past medical history, social, surgical and family history all reviewed in electronic medical record.   Review of Systems: No headache, visual changes, nausea, vomiting, diarrhea, constipation,  dizziness, abdominal pain, skin rash, fevers, chills, night sweats, weight loss, swollen lymph nodes, body aches, joint swelling, muscle aches, chest pain, shortness of breath, mood changes.   Objective Blood pressure 118/80, pulse 118, SpO2 99 %.  General: No apparent distress alert and oriented x3 mood and affect a little depressed, dressed appropriately.  HEENT: Pupils equal, extraocular movements intact . 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 No significant erythema noted. No buccal involvement. Respiratory: Patient's speak in full sentences and does not appear short of breath  Cardiovascular: No lower extremity edema, non tender, no erythema  Skin: Warm dry intact with no signs of infection or rash on extremities or on axial skeleton.  Abdomen: Soft nontender  Neuro: Cranial nerves II through XII are intact, neurovascularly intact in all extremities with 2+ DTRs and 2+ pulses.  Lymph: Positive anterior chain lymphadenopathy bilaterally moderately tender lymph nodes noted. Gait normal with good balance and coordination.  MSK:  Non tender with full range of motion and good stability and symmetric strength and tone of shoulders,  wrist, hip, knee and ankles bilaterally.   Back Exam:  Inspection: Unremarkable  Motion: Flexion 45 deg, Extension 35 deg, Side Bending to 45 deg bilaterally,  Rotation to 45 deg bilaterally  SLR laying: Negative  XSLR laying: Negative  Palpable tenderness: more pain over the right  ischial tuberosity and then previous exam.continues to seem to worsening FABER: positive on the right side which is new Sensory change: Gross sensation intact to all lumbar and sacral dermatomes.  Reflexes: 2+ at both patellar tendons, 2+ at achilles tendons, Babinski's downgoing.  Strength at foot  Plantar-flexion: 5/5 Dorsi-flexion: 5/5 Eversion: 5/5 Inversion: 5/5  Leg strength  Quad: 5/5 Hamstring: 5/5 but does  have tightness on the right side compared to the contralateral side Hip flexorstill present with no significant change: 5/5 Hip abductors: 4/5  Gait unremarkable.   Osteopathic findings Cervical C2 flexed rotated and side bent right  T3 extended rotated and side bent right T9 extended rotated and side bent left  L2 flexed rotated and side bent right L4 flexed rotated and side bent left  Sacrum left on left    pelvic shear noted right posterior iliumstill present Impression and Recommendations:     This case required medical decision making of moderate complexity.

## 2016-04-27 NOTE — Assessment & Plan Note (Signed)
Continues to have difficulty. We did discuss amount possible imaging which patient declined. Patient though pain is giving her enough pain that I would like her to be out of work for one shift. We discussed icing regimen, home exercises, we discussed which activities to do in which ones to avoid. Patient encouraged to start the exercises on a more regular basis again. Discussed that if continuing have difficulty we may need to consider further imaging especially with patient's history of a melanoma. Patient will consider this.

## 2016-04-27 NOTE — Assessment & Plan Note (Signed)
Decision today to treat with OMT was based on Physical Exam  After verbal consent patient was treated with HVLA, ME techniques in thoracici, lumbar and sacral areas  Patient tolerated the procedure well with improvement in symptoms  Patient given exercises, stretches and lifestyle modifications  See medications in patient instructions if given  Patient will follow up in 3-4 weeks

## 2016-04-27 NOTE — Assessment & Plan Note (Signed)
Discussed with patient again at great length. I feel that possibly her environment needs change. Patient is supposed to be doing marriage counseling but is not doing it. Trying to find a psychologist patient has been referred previously. Do not feel that we should make any significance changes in her medications at this moment. Patient denies any suicidal or homicidal ideation or any real true depression she is having difficulty with retraction. We'll discuss again in 3 weeks.

## 2016-04-27 NOTE — Patient Instructions (Signed)
Good to see you  We will keep you out one day  Get time for yourself and get in the gym! See me again in 3 weeks.

## 2016-04-28 MED FILL — AMPHETAMINE SALTS 15 MG TAB: 15 | 30 days supply | Qty: 30 | Fill #0

## 2016-04-28 MED FILL — DEXTROAMP-AMPHETAMINE 5 MG: 5 | 15 days supply | Qty: 30 | Fill #0

## 2016-05-16 ENCOUNTER — Ambulatory Visit: Payer: 59 | Admitting: Family Medicine

## 2016-05-16 MED FILL — LEVOTHYROXINE 50 MCG TABLET: 50 | 30 days supply | Qty: 30 | Fill #3

## 2016-05-26 ENCOUNTER — Other Ambulatory Visit: Payer: Self-pay | Admitting: Family Medicine

## 2016-05-26 MED FILL — VENLAFAXINE HCL ER 150 MG C: 150 | 90 days supply | Qty: 90 | Fill #0

## 2016-05-26 NOTE — Telephone Encounter (Signed)
Refill done.  

## 2016-06-02 ENCOUNTER — Encounter: Payer: Self-pay | Admitting: Family Medicine

## 2016-06-02 ENCOUNTER — Ambulatory Visit (INDEPENDENT_AMBULATORY_CARE_PROVIDER_SITE_OTHER): Payer: 59 | Admitting: Family Medicine

## 2016-06-02 VITALS — BP 118/80 | HR 86

## 2016-06-02 DIAGNOSIS — M76899 Other specified enthesopathies of unspecified lower limb, excluding foot: Secondary | ICD-10-CM

## 2016-06-02 DIAGNOSIS — M9903 Segmental and somatic dysfunction of lumbar region: Secondary | ICD-10-CM | POA: Diagnosis not present

## 2016-06-02 DIAGNOSIS — F411 Generalized anxiety disorder: Secondary | ICD-10-CM | POA: Diagnosis not present

## 2016-06-02 DIAGNOSIS — M9902 Segmental and somatic dysfunction of thoracic region: Secondary | ICD-10-CM

## 2016-06-02 DIAGNOSIS — M6580 Other synovitis and tenosynovitis, unspecified site: Secondary | ICD-10-CM

## 2016-06-02 DIAGNOSIS — M9904 Segmental and somatic dysfunction of sacral region: Secondary | ICD-10-CM

## 2016-06-02 DIAGNOSIS — M999 Biomechanical lesion, unspecified: Secondary | ICD-10-CM

## 2016-06-02 DIAGNOSIS — M533 Sacrococcygeal disorders, not elsewhere classified: Secondary | ICD-10-CM

## 2016-06-02 NOTE — Addendum Note (Signed)
Addended by: Douglass Rivers T on: 06/02/2016 04:18 PM   Modules accepted: Orders

## 2016-06-02 NOTE — Assessment & Plan Note (Signed)
Decision today to treat with OMT was based on Physical Exam  After verbal consent patient was treated with HVLA, ME techniques in thoracici, lumbar and sacral areas  Patient tolerated the procedure well with improvement in symptoms  Patient given exercises, stretches and lifestyle modifications  See medications in patient instructions if given  Patient will follow up in 4-6 weeks

## 2016-06-02 NOTE — Assessment & Plan Note (Signed)
Referred again to behavioral health for input. We'll continue on the Effexor at this moment.

## 2016-06-02 NOTE — Assessment & Plan Note (Signed)
Continues to give her some difficulty. Encourage her to continue to work on core strengthening and the home exercises. We discussed which activities to do in which ones to avoid. I do believe that overall she should make significant improvement or she did not have as much stress. Encourage weight loss as well.

## 2016-06-02 NOTE — Patient Instructions (Signed)
Good to see you  Keep working on the hamstring They will call you  See me again in 4-6 weeks.

## 2016-06-02 NOTE — Progress Notes (Signed)
[name] [address] [place], [place] [address] Phone: [phone] Subjective:     CC: Neck pain and anxiety f/u   [id number] [place] Rollie Nayak is a 35 y.o. female coming in right fsacroiliac pain. Patient continues to lack of  Motivation to work out on a regular basis. Having increasing intensity in the back pain. Still feels and anxiety and stress seems to play a role. Patient didn't family start going back to the gym. Since she's been doing that a little bit better. Some tightness of the right hamstring. Otherwise fairly unremarkable. Same discomfort overall. More tightness.   Continues to have stress in her life as well as at work. Patient states that the Effexor has helped overall. Patient continues to have increasing stress at work as well as somewhat at home. Patient states that it's making it more difficult overall. Patient is having difficulty and would consider talking to someone now. Had been referred before but did not make an appointment.  Past Medical History  Diagnosis Date  . Allergy   . Asthma    Past Surgical History  Procedure Laterality Date  . Skin cancer excision    . Hysteroscopy with novasure N/A 05/23/2014    Procedure: Velvet Bathe;  Surgeon: Cheri Fowler, MD;  Location: [facility];  Service: Gynecology;  Laterality: N/A;   Social History  Substance Use Topics  . Smoking status: Never Smoker   . Smokeless tobacco: Never Used  . Alcohol Use: Yes   Allergies  Allergen Reactions  . Dextromethorphan Hbr Rash   Family History  Problem Relation Age of Onset  . Fibromyalgia Father   . Autoimmune disease Father   . Cancer Maternal Grandmother     stomach  . Diabetes Maternal Grandmother   . Diabetes Paternal Grandmother   . Heart disease Paternal Grandmother   . Alcohol abuse Paternal Grandfather   . Cancer Paternal Grandfather     lung     Past medical history, social, surgical and family history all reviewed  in electronic medical record.   Review of Systems: No headache, visual changes, nausea, vomiting, diarrhea, constipation, dizziness, abdominal pain, skin rash, fevers, chills, night sweats, weight loss, swollen lymph nodes, body aches, joint swelling, muscle aches, chest pain, shortness of breath, mood changes.   Objective Blood pressure 118/80, pulse 86.  General: No apparent distress alert and oriented x3 mood and affect a little depressed, dressed appropriately.  HEENT: Pupils equal, extraocular movements intact . 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 No significant erythema noted. No buccal involvement. Respiratory: Patient's speak in full sentences and does not appear short of breath  Cardiovascular: No lower extremity edema, non tender, no erythema  Skin: Warm dry intact with no signs of infection or rash on extremities or on axial skeleton.  Abdomen: Soft nontender  Neuro: Cranial nerves II through XII are intact, neurovascularly intact in all extremities with 2+ DTRs and 2+ pulses.  Lymph: Positive anterior chain lymphadenopathy bilaterally moderately tender lymph nodes noted. Gait normal with good balance and coordination.  MSK:  Non tender with full range of motion and good stability and symmetric strength and tone of shoulders,  wrist, hip, knee and ankles bilaterally.   Back Exam:  Inspection: Unremarkable  Motion: Flexion 45 deg, Extension 35 deg, Side Bending to 45 deg bilaterally,  Rotation to 45 deg bilaterally  SLR laying: Negative  XSLR laying: Negative  Palpable tenderness: Continued discomfort over the right ischial tuberosity area. Would be stable and no worsening from previous exam FABER: positive on the right side but stable Sensory change: Gross sensation intact to all lumbar and sacral dermatomes.  Reflexes: 2+ at both patellar tendons, 2+ at achilles tendons, Babinski's downgoing.  Strength at foot   Plantar-flexion: 5/5 Dorsi-flexion: 5/5 Eversion: 5/5 Inversion: 5/5  Leg strength  Quad: 5/5 Hamstring: 5/5 but does have tightness on the right side compared to the contralateral side    Osteopathic findings Cervical C2 flexed rotated and side bent right C4 flexed rotated and side bent right  T3 extended rotated and side bent right T7 extended rotated and side bent left  L2 flexed rotated and side bent right L4 flexed rotated and side bent left  Sacrum left on left    pelvic shear noted  Impression and Recommendations:     This case required medical decision making of moderate complexity.

## 2016-06-08 MED FILL — DEXTROAMP-AMPHETAMINE 5 MG: 5 | 15 days supply | Qty: 30 | Fill #0

## 2016-06-08 MED FILL — AMPHETAMINE SALTS 15 MG TAB: 15 | 30 days supply | Qty: 30 | Fill #0

## 2016-06-13 ENCOUNTER — Telehealth: Payer: Self-pay | Admitting: Family Medicine

## 2016-06-13 NOTE — Telephone Encounter (Signed)
Not seen in our office in 2 years.  Ok to refill #30, 0 ref  F/u Dr B

## 2016-06-14 MED ORDER — LEVOTHYROXINE SODIUM 50 MCG PO TABS: 50.0000 ug | ORAL_TABLET | Freq: Every day | ORAL | Status: DC

## 2016-06-14 MED FILL — LEVOTHYROXINE 50 MCG TABLET: 50 | 90 days supply | Qty: 90 | Fill #0

## 2016-06-14 NOTE — Telephone Encounter (Signed)
Patient called to follow up on levothyroxine (SYNTHROID, LEVOTHROID) 50 MCG tablet [Pharmacy Med Name: LEVOTHYROXINE 50 MCG TABLET 50 MCG TAB] request. Was sent to Dr Lorelei Pont. She states that you manage this medication. Please take a look.

## 2016-06-14 NOTE — Addendum Note (Signed)
Addended by: Lyndal Pulley on: 06/14/2016 04:17 PM   Modules accepted: Orders

## 2016-07-04 ENCOUNTER — Ambulatory Visit: Payer: 59 | Admitting: Family Medicine

## 2016-07-19 MED FILL — AMPHETAMINE SALTS 15 MG TAB: 15 | 30 days supply | Qty: 30 | Fill #0

## 2016-07-19 MED FILL — DEXTROAMP-AMPHETAMINE 5 MG: 5 | 15 days supply | Qty: 30 | Fill #0

## 2016-08-19 MED FILL — DEXTROAMP-AMPHETAMINE 5 MG: 5 | 15 days supply | Qty: 30 | Fill #0

## 2016-08-19 MED FILL — AMPHETAMINE SALTS 15 MG TAB: 15 | 30 days supply | Qty: 30 | Fill #0

## 2016-08-26 MED FILL — VENLAFAXINE HCL ER 150 MG C: 150 | 90 days supply | Qty: 90 | Fill #1

## 2016-09-22 DIAGNOSIS — N911 Secondary amenorrhea: Secondary | ICD-10-CM | POA: Diagnosis not present

## 2016-09-22 DIAGNOSIS — Z3201 Encounter for pregnancy test, result positive: Secondary | ICD-10-CM | POA: Diagnosis not present

## 2016-09-22 MED FILL — LEVOTHYROXINE 50 MCG TABLET: 50 | 90 days supply | Qty: 90 | Fill #1

## 2016-09-30 DIAGNOSIS — O200 Threatened abortion: Secondary | ICD-10-CM | POA: Diagnosis not present

## 2016-09-30 DIAGNOSIS — O09521 Supervision of elderly multigravida, first trimester: Secondary | ICD-10-CM | POA: Diagnosis not present

## 2016-09-30 DIAGNOSIS — Z3A01 Less than 8 weeks gestation of pregnancy: Secondary | ICD-10-CM | POA: Diagnosis not present

## 2016-09-30 DIAGNOSIS — O26841 Uterine size-date discrepancy, first trimester: Secondary | ICD-10-CM | POA: Diagnosis not present

## 2016-10-06 MED FILL — miSOPROStol 200 MCG TABS: 200 | 1 days supply | Qty: 6 | Fill #0

## 2016-10-27 MED FILL — DEXTROAMP-AMPHETAMINE 5 MG: 5 | 15 days supply | Qty: 30 | Fill #0

## 2016-10-27 MED FILL — AMPHETAMINE SALTS 15 MG TAB: 15 | 30 days supply | Qty: 30 | Fill #0

## 2016-11-01 NOTE — Progress Notes (Signed)
[name] [place], [place] [address] Phone: [phone] Subjective:     CC: Neck pain and anxiety f/u   [id number]  Crystal [name] is a 35 y.o. female coming in right fsacroiliac pain. Patient continues to lack of  Motivation to work out on a regular basis. Patient since last visit has also had a miscarriage. Patient stopped her Effexor while she was pregnant and has not restarted it. Since then has gained a significant amount of weight she states. States that this has been more and more lysing. Patient still has not been motivated to work out on a regular basis. Patient is also having difficulty can she does work night shifts and feels that this is concerning as well.  Patient is accompanied with husband. States that he did not note will try to get pregnant again. Still having some anxiety but does not think that she wants to do medications again at this time.  Past Medical History:  Diagnosis Date  . Allergy   . Asthma    Past Surgical History:  Procedure Laterality Date  . HYSTEROSCOPY WITH NOVASURE N/A 05/23/2014   Procedure: Velvet Bathe;  Surgeon: Cheri Fowler, MD;  Location: [facility];  Service: Gynecology;  Laterality: N/A;  . SKIN CANCER EXCISION     Social History  Substance Use Topics  . Smoking status: Never Smoker  . Smokeless tobacco: Never Used  . Alcohol use Yes   Allergies  Allergen Reactions  . Dextromethorphan Hbr Rash   Family History  Problem Relation Age of Onset  . Fibromyalgia Father   . Autoimmune disease Father   . Cancer Maternal Grandmother     stomach  . Diabetes Maternal Grandmother   . Diabetes Paternal Grandmother   . Heart disease Paternal Grandmother   . Alcohol abuse Paternal Grandfather   . Cancer Paternal Grandfather     lung     Past medical history, social, surgical and family history all reviewed in electronic medical record.   Review of Systems: No headache, visual changes,  nausea, vomiting, diarrhea, constipation, dizziness, abdominal pain, skin rash, fevers, chills, night sweats, weight loss, swollen lymph nodes, body aches, joint swelling, muscle aches, chest pain, shortness of breath, mood changes.   Objective  Blood pressure 118/84, pulse 84, height 5\' 4"  (1.626 m), weight 162 lb 3.2 oz (73.6 kg).  Systems examined below as of 11/02/16 General: NAD A&O x3 mood, affect normal  HEENT: Pupils equal, extraocular movements intact no nystagmus Respiratory: not short of breath at rest or with speaking Cardiovascular: No lower extremity edema, non tender Skin: Warm dry intact with no signs of infection or rash on extremities or on axial skeleton. Abdomen: Soft nontender, no masses Neuro: Cranial nerves  intact, neurovascularly intact in all extremities with 2+ DTRs and 2+ pulses. Lymph: No lymphadenopathy appreciated today  Gait normal with good balance and coordination.  MSK: Non tender with full range of motion and good stability and symmetric strength and tone of shoulders, elbows, wrist,  knee hips and ankles bilaterally.    Back Exam:  Inspection: Unremarkable  Motion: Flexion 45 deg, Extension 25 deg, Side Bending to 45 deg bilaterally,  Rotation to 35 deg bilaterally mild tightness from previous exam SLR laying: Negative  XSLR laying: Negative  Palpable tenderness: More discomfort over the ischial tuberosity as well as the posterior aspect of the sacral joint on the right FABER: positive on the right side  Sensory  change: Gross sensation intact to all lumbar and sacral dermatomes.  Reflexes: 2+ at both patellar tendons, 2+ at achilles tendons, Babinski's downgoing.  Strength at foot  Plantar-flexion: 5/5 Dorsi-flexion: 5/5 Eversion: 5/5 Inversion: 5/5  Leg strength  Quad: 5/5 Hamstring: 5/5 continued tightness  Osteopathic findings Cervical C2 flexed rotated and side bent right C6 flexed rotated and side bent right  T3 extended rotated and side  bent right T8 extended rotated and side bent left  L2 flexed rotated and side bent right L5 flexed rotated and side bent left  Sacrum left on left    pelvic shear noted  Impression and Recommendations:     This case required medical decision making of moderate complexity.

## 2016-11-02 ENCOUNTER — Encounter: Payer: Self-pay | Admitting: Family Medicine

## 2016-11-02 ENCOUNTER — Ambulatory Visit (INDEPENDENT_AMBULATORY_CARE_PROVIDER_SITE_OTHER): Payer: 59 | Admitting: Family Medicine

## 2016-11-02 ENCOUNTER — Other Ambulatory Visit (INDEPENDENT_AMBULATORY_CARE_PROVIDER_SITE_OTHER): Payer: 59

## 2016-11-02 VITALS — BP 118/84 | HR 84 | Ht 64.0 in | Wt 162.2 lb

## 2016-11-02 DIAGNOSIS — M999 Biomechanical lesion, unspecified: Secondary | ICD-10-CM

## 2016-11-02 DIAGNOSIS — F411 Generalized anxiety disorder: Secondary | ICD-10-CM

## 2016-11-02 DIAGNOSIS — M533 Sacrococcygeal disorders, not elsewhere classified: Secondary | ICD-10-CM

## 2016-11-02 DIAGNOSIS — R635 Abnormal weight gain: Secondary | ICD-10-CM | POA: Diagnosis not present

## 2016-11-02 LAB — T4, FREE: Free T4: 0.88 ng/dL (ref 0.60–1.60)

## 2016-11-02 LAB — T3, FREE: T3, Free: 3.5 pg/mL (ref 2.3–4.2)

## 2016-11-02 LAB — TSH: TSH: 0.72 u[IU]/mL (ref 0.35–4.50)

## 2016-11-02 NOTE — Assessment & Plan Note (Signed)
Decision today to treat with OMT was based on Physical Exam  After verbal consent patient was treated with HVLA, ME techniques in thoracici, lumbar and sacral areas  Patient tolerated the procedure well with improvement in symptoms  Patient given exercises, stretches and lifestyle modifications  See medications in patient instructions if given  Patient will follow up in 3-6 weeks

## 2016-11-02 NOTE — Assessment & Plan Note (Signed)
Patient states that she would like to stay off the medication at this time. May need some in the future. Patient will come back and see me again in 3 weeks and we'll discuss again.

## 2016-11-02 NOTE — Assessment & Plan Note (Signed)
Tightness noted again. Sacroiliac Joint Mobilization and Rehab 1. Work on pretzel stretching, shoulder back and leg draped in front. 3-5 sets, 30 sec.. 2. hip abductor rotations. standing, hip flexion and rotation outward then inward. 3 sets, 15 reps. when can do comfortably, add ankle weights starting at 2 pounds.  3. cross over stretching - shoulder back to ground, same side leg crossover. 3-5 sets for 30 min..  4. rolling up and back knees to chest and rocking. 5. sacral tilt - 5 sets, hold for 5-10 seconds Encouraged patient is a more regular basis. Patient is waking we will check her thyroid function to make sure she is doing relatively fine. Continues to be on the Synthroid. Has not notice any significant improvement. Discontinued Effexor at this time. We will continue to monitor.

## 2016-11-02 NOTE — Patient Instructions (Signed)
Good to see you  Lets get you active again .  Ice is your friend  Ice 20 minutes 2 times daily. Usually after activity and before bed. Wear the brace with activity  We will get labs Hip abductors are key.  Lets hold on new medicines for now but see me again in 3 weeks.

## 2016-11-15 ENCOUNTER — Telehealth: Payer: Self-pay | Admitting: Family Medicine

## 2016-11-15 NOTE — Telephone Encounter (Signed)
Crystal [name] called to advise that she fell down the steps this weekend. She is looking to be worked in [employer]. Please let know

## 2016-11-15 NOTE — Telephone Encounter (Signed)
Spoke to pt, scheduled her for 11.22.17 @ 2:15pm.

## 2016-11-16 ENCOUNTER — Encounter: Payer: Self-pay | Admitting: Family Medicine

## 2016-11-16 ENCOUNTER — Ambulatory Visit (INDEPENDENT_AMBULATORY_CARE_PROVIDER_SITE_OTHER): Payer: 59 | Admitting: Family Medicine

## 2016-11-16 DIAGNOSIS — S134XXA Sprain of ligaments of cervical spine, initial encounter: Secondary | ICD-10-CM | POA: Diagnosis not present

## 2016-11-16 DIAGNOSIS — S060X9A Concussion with loss of consciousness of unspecified duration, initial encounter: Secondary | ICD-10-CM | POA: Diagnosis not present

## 2016-11-16 MED ORDER — PREDNISONE 50 MG PO TABS: 50.0000 mg | ORAL_TABLET | Freq: Every day | ORAL | 0 refills | Status: DC

## 2016-11-16 MED ORDER — MELOXICAM 15 MG PO TABS: 15.0000 mg | ORAL_TABLET | Freq: Every day | ORAL | 0 refills | Status: DC

## 2016-11-16 MED FILL — MELOXICAM 15 MG TABLET: 15 | 30 days supply | Qty: 30 | Fill #0

## 2016-11-16 MED FILL — predniSONE 50 MG TABS: 50 | 5 days supply | Qty: 5 | Fill #0

## 2016-11-16 NOTE — Assessment & Plan Note (Signed)
Tightness of the neck noted. No Spurling's and no radicular symptoms. Meloxicam and/or prednisone given. Patient was taken. Patient will follow-up with me again in 2 weeks. If no improvement consider x-ray and MRI

## 2016-11-16 NOTE — Progress Notes (Signed)
[name] [place], [place] [address] Phone: [phone] Subjective:     CC: Head and neck pain after fall  [id number]  Crystal [name] is a 35 y.o. female coming in with complaint of neck pain. Patient fell down the floor. Patient does not remember hitting her head but unfortunately did pass out. Patient does not remember why she fell except fracture. Over the dog. Patient states since then she has had a mild headache on the right side. Denies any visual changes, states that she is having some neck pain. Denies any numbness or weakness in any of the extremity. Has a bruise on her elbow as well as her backside but very minimal. States that she can move and has even worked the last 2 days. Patient states that she is having some mild difficulty with concentration.  Past Medical History:  Diagnosis Date  . Allergy   . Asthma    Past Surgical History:  Procedure Laterality Date  . HYSTEROSCOPY WITH NOVASURE N/A 05/23/2014   Procedure: Velvet Bathe;  Surgeon: Cheri Fowler, MD;  Location: [facility];  Service: Gynecology;  Laterality: N/A;  . SKIN CANCER EXCISION     Social History   Social History  . Marital status: Married    Spouse name: N/A  . Number of children: N/A  . Years of education: N/A   Social History Main Topics  . Smoking status: Never Smoker  . Smokeless tobacco: Never Used  . Alcohol use Yes  . Drug use: No  . Sexual activity: Not Asked   Other Topics Concern  . None   Social History Narrative   Regular exercise- yes   Allergies  Allergen Reactions  . Dextromethorphan Hbr Rash   Family History  Problem Relation Age of Onset  . Fibromyalgia Father   . Autoimmune disease Father   . Cancer Maternal Grandmother     stomach  . Diabetes Maternal Grandmother   . Diabetes Paternal Grandmother   . Heart disease Paternal Grandmother   . Alcohol abuse Paternal Grandfather   . Cancer Paternal Grandfather     lung     Past medical history, social, surgical and family history all reviewed in electronic medical record.  No pertanent information unless stated regarding to the chief complaint.   Review of Systems:Review of systems updated and as accurate as of 11/16/16  No headache, visual changes, nausea, vomiting, diarrhea, constipation, dizziness, abdominal pain, skin rash, fevers, chills, night sweats, weight loss, swollen lymph nodes, body aches, joint swelling, muscle aches, chest pain, shortness of breath, mood changes.   Objective  Blood pressure 122/82, pulse 95, height 5\' 4"  (1.626 m), weight 164 lb (74.4 kg), SpO2 96 %. Systems examined below as of 11/16/16   General: No apparent distress alert and oriented x3 mood and affect normal, dressed appropriately.  HEENT: Pupils equal, extraocular movements intact  Respiratory: Patient's speak in full sentences and does not appear short of breath  Cardiovascular: No lower extremity edema, non tender, no erythema  Skin: Warm dry intact with no signs of infection or rash on extremities or on axial skeleton.  Abdomen: Soft nontender  Neuro: Cranial nerves II through XII are intact, neurovascularly intact in all extremities with 2+ DTRs and 2+ pulses.  Lymph: No lymphadenopathy of posterior or anterior cervical chain or axillae bilaterally.  Gait normal with good balance and coordination.  MSK:  Non tender with full range of motion and good stability and symmetric  strength and tone of shoulders, elbows, wrist, hip, knee and ankles bilaterally.  Concussion evaluation shows the patient's having difficulty with nystagmus of the eyes. Patient failed recall but did pass serial sevens. Difficulty with fine motor minimal.  Neck: Inspection unremarkable. No palpable stepoffs. Negative Spurling's maneuver. Mild tightness lacking the last 5-10 in all planes. Grip strength and sensation normal in bilateral hands Strength good C4 to T1 distribution No sensory  change to C4 to T1 Negative Hoffman sign bilaterally Reflexes normal   Impression and Recommendations:     This case required medical decision making of moderate complexity.      Note: This dictation was prepared with Dragon dictation along with smaller phrase technology. Any transcriptional errors that result from this process are unintentional.

## 2016-11-16 NOTE — Patient Instructions (Signed)
Good to see you I do think you have a concussion.  I would like you to limit screen time (including your phone) to 30 minutes daily outside of homework.  No sport until you are back in school with no symptoms.  You may then start the return to play protocol I am giving you.  In addition to this I recommend......  To help improve COGNITIVE function: Using fish oil/omega 3 that is 1000 mg (or roughly 600 mg EPA/DHA), starting as soon as possible after concussion, take: 3 tabs THREE TIMES a day  for the first 3 days, then (you will smell a little, sory) 3 tabs TWICE DAILY  for the next 3 days, then 3 tabs ONCE DAILY  for the next 10 days   To help reduce HEADACHES: Coenzyme Q10 160mg  ONCE DAILY May stop after headaches are resolved.                                                                                                 Other medicines to help decrease inflammation Alpha Lipoic Acid 100mg  TWICE DAILY  I want to see you again in 1-2 weeks.

## 2016-11-16 NOTE — Assessment & Plan Note (Signed)
Patient did have what appears to be concussion. Discussed with patient at great length. Patient also has what appears to be more of a whiplash injury. Patient has not had any visual changes but we did discuss signs and symptoms that would warrant her to see medical attention over the holiday weekend. We discussed over-the-counter medications to help with the inflammation. With patient having some whiplash we did discuss other medications and prescriptions were given. Patient will follow-up again in 2 weeks to make sure that it is completely resolved.

## 2016-11-21 DIAGNOSIS — Z6828 Body mass index (BMI) 28.0-28.9, adult: Secondary | ICD-10-CM | POA: Diagnosis not present

## 2016-11-21 DIAGNOSIS — Z01419 Encounter for gynecological examination (general) (routine) without abnormal findings: Secondary | ICD-10-CM | POA: Diagnosis not present

## 2016-11-21 DIAGNOSIS — Z130 Encounter for screening for diseases of the blood and blood-forming organs and certain disorders involving the immune mechanism: Secondary | ICD-10-CM | POA: Diagnosis not present

## 2016-11-21 DIAGNOSIS — Z1389 Encounter for screening for other disorder: Secondary | ICD-10-CM | POA: Diagnosis not present

## 2016-11-21 DIAGNOSIS — Z3009 Encounter for other general counseling and advice on contraception: Secondary | ICD-10-CM | POA: Diagnosis not present

## 2016-11-23 NOTE — Progress Notes (Deleted)
[name] [place], [place] [address] Phone: [phone] Subjective:     CC: Head and neck pain after fall f/u   [id number]  Crystal [name] is a 35 y.o. female coming in with complaint of neck pain. Patient fell down the floor. Patient was seen by me and did have what appeared to be more whiplash as well as a concussion. Patient was to do oral anti-inflammatories, icing regimen, home exercises. Patient was to avoid certain activities. We discussed over-the-counter medications a could be beneficial. Patient states.  Past Medical History:  Diagnosis Date  . Allergy   . Asthma    Past Surgical History:  Procedure Laterality Date  . HYSTEROSCOPY WITH NOVASURE N/A 05/23/2014   Procedure: Velvet Bathe;  Surgeon: Cheri Fowler, MD;  Location: [facility];  Service: Gynecology;  Laterality: N/A;  . SKIN CANCER EXCISION     Social History   Social History  . Marital status: Married    Spouse name: N/A  . Number of children: N/A  . Years of education: N/A   Social History Main Topics  . Smoking status: Never Smoker  . Smokeless tobacco: Never Used  . Alcohol use Yes  . Drug use: No  . Sexual activity: Not on file   Other Topics Concern  . Not on file   Social History Narrative   Regular exercise- yes   Allergies  Allergen Reactions  . Dextromethorphan Hbr Rash   Family History  Problem Relation Age of Onset  . Fibromyalgia Father   . Autoimmune disease Father   . Cancer Maternal Grandmother     stomach  . Diabetes Maternal Grandmother   . Diabetes Paternal Grandmother   . Heart disease Paternal Grandmother   . Alcohol abuse Paternal Grandfather   . Cancer Paternal Grandfather     lung    Past medical history, social, surgical and family history all reviewed in electronic medical record.  No pertanent information unless stated regarding to the chief complaint.   Review of Systems:Review of systems updated and as accurate  as of 11/23/16  No headache, visual changes, nausea, vomiting, diarrhea, constipation, dizziness, abdominal pain, skin rash, fevers, chills, night sweats, weight loss, swollen lymph nodes, body aches, joint swelling, muscle aches, chest pain, shortness of breath, mood changes.   Objective  There were no vitals taken for this visit.   Systems examined below as of 11/23/16 General: NAD A&O x3 mood, affect normal  HEENT: Pupils equal, extraocular movements intact no nystagmus Respiratory: not short of breath at rest or with speaking Cardiovascular: No lower extremity edema, non tender Skin: Warm dry intact with no signs of infection or rash on extremities or on axial skeleton. Abdomen: Soft nontender, no masses Neuro: Cranial nerves  intact, neurovascularly intact in all extremities with 2+ DTRs and 2+ pulses. Lymph: No lymphadenopathy appreciated today  Gait normal with good balance and coordination.  MSK: Non tender with full range of motion and good stability and symmetric strength and tone of shoulders, elbows, wrist,  knee hips and ankles bilaterally.   Concussion evaluation shows the patient's having difficulty with nystagmus of the eyes. Patient failed recall but did pass serial sevens. Difficulty with fine motor minimal.  Neck: Inspection unremarkable. No palpable stepoffs. Negative Spurling's maneuver. Mild tightness lacking the last 5-10 in all planes. Grip strength and sensation normal in bilateral hands Strength good C4 to T1 distribution No sensory change to C4 to T1 Negative Hoffman sign  bilaterally Reflexes normal   Impression and Recommendations:     This case required medical decision making of moderate complexity.      Note: This dictation was prepared with Dragon dictation along with smaller phrase technology. Any transcriptional errors that result from this process are unintentional.

## 2016-11-24 ENCOUNTER — Ambulatory Visit: Payer: 59 | Admitting: Family Medicine

## 2016-11-24 NOTE — Progress Notes (Signed)
[name] [place], [place] [address] Phone: [phone] Subjective:     CC: Head and neck pain after fall f/u   [id number]  Crystal [name] is a 35 y.o. female coming in with complaint of neck pain. Patient fell down the floor. Patient was seen by me and did have what appeared to be more whiplash as well as a concussion. Patient was to do oral anti-inflammatories, icing regimen, home exercises. Patient was to avoid certain activities. We discussed over-the-counter medications a could be beneficial. Patient states She still has up her neck pain and right trapezius pain. Still having some mild headache. Did have 1 episode of vomiting with her headache. Patient states that she still feels like she is in a cloud. A little more difficulty with concentration but she does think she is improving slowly.  Past Medical History:  Diagnosis Date  . Allergy   . Asthma    Past Surgical History:  Procedure Laterality Date  . HYSTEROSCOPY WITH NOVASURE N/A 05/23/2014   Procedure: Velvet Bathe;  Surgeon: Cheri Fowler, MD;  Location: [facility];  Service: Gynecology;  Laterality: N/A;  . SKIN CANCER EXCISION     Social History   Social History  . Marital status: Married    Spouse name: N/A  . Number of children: N/A  . Years of education: N/A   Social History Main Topics  . Smoking status: Never Smoker  . Smokeless tobacco: Never Used  . Alcohol use Yes  . Drug use: No  . Sexual activity: Not Asked   Other Topics Concern  . None   Social History Narrative   Regular exercise- yes   Allergies  Allergen Reactions  . Dextromethorphan Hbr Rash   Family History  Problem Relation Age of Onset  . Fibromyalgia Father   . Autoimmune disease Father   . Cancer Maternal Grandmother     stomach  . Diabetes Maternal Grandmother   . Diabetes Paternal Grandmother   . Heart disease Paternal Grandmother   . Alcohol abuse Paternal Grandfather   . Cancer  Paternal Grandfather     lung    Past medical history, social, surgical and family history all reviewed in electronic medical record.  No pertanent information unless stated regarding to the chief complaint.   Review of Systems:Review of systems updated and as accurate as of 11/25/16  No headache, visual changes, nausea, vomiting, diarrhea, constipation, dizziness, abdominal pain, skin rash, fevers, chills, night sweats, weight loss, swollen lymph nodes, body aches, joint swelling, muscle aches, chest pain, shortness of breath, mood changes.   Objective  Blood pressure 112/82, pulse 77, height 5\' 4"  (1.626 m), weight 164 lb (74.4 kg), SpO2 98 %.   Systems examined below as of 11/25/16 General: NAD A&O x3 mood, affect normal  HEENT: Pupils equal, extraocular movements intact no nystagmus Respiratory: not short of breath at rest or with speaking Cardiovascular: No lower extremity edema, non tender Skin: Warm dry intact with no signs of infection or rash on extremities or on axial skeleton. Abdomen: Soft nontender, no masses Neuro: Cranial nerves  intact, neurovascularly intact in all extremities with 2+ DTRs and 2+ pulses. Lymph: No lymphadenopathy appreciated today  Gait normal with good balance and coordination.  MSK: Non tender with full range of motion and good stability and symmetric strength and tone of shoulders, elbows, wrist,  knee hips and ankles bilaterally.   Concussion evaluation shows the patient's having difficulty with nystagmus of  the eyes no improvement. Improvement with fine motor skills  Neck: Inspection unremarkable. No palpable stepoffs. Negative Spurling's maneuver. Continued tightness on the right side of the neck. Mild limitation in side bending and rotation Grip strength and sensation normal in bilateral hands Strength good C4 to T1 distribution No sensory change to C4 to T1 Negative Hoffman sign bilaterally Reflexes normal Increased tightness of the right  trapezius  Procedure note After verbal consent patient was prepped with alcohol swabs and with a 25-gauge half-inch needle was injected in 3 specific trigger points in the right trapezius muscle. Total of 3 mL of 0.5% Marcaine and 1 mL of Kenalog 40 mg/dL use. Band-Aids placed. Post injection instructions given.   Impression and Recommendations:     This case required medical decision making of moderate complexity.      Note: This dictation was prepared with Dragon dictation along with smaller phrase technology. Any transcriptional errors that result from this process are unintentional.

## 2016-11-25 ENCOUNTER — Ambulatory Visit (INDEPENDENT_AMBULATORY_CARE_PROVIDER_SITE_OTHER): Payer: 59 | Admitting: Family Medicine

## 2016-11-25 ENCOUNTER — Encounter: Payer: Self-pay | Admitting: Family Medicine

## 2016-11-25 DIAGNOSIS — S060X9D Concussion with loss of consciousness of unspecified duration, subsequent encounter: Secondary | ICD-10-CM | POA: Diagnosis not present

## 2016-11-25 DIAGNOSIS — M25511 Pain in right shoulder: Secondary | ICD-10-CM

## 2016-11-25 DIAGNOSIS — S134XXD Sprain of ligaments of cervical spine, subsequent encounter: Secondary | ICD-10-CM | POA: Diagnosis not present

## 2016-11-25 MED FILL — DEXTROAMP-AMPHETAMINE 5 MG: 5 | 15 days supply | Qty: 30 | Fill #0

## 2016-11-25 MED FILL — AMPHETAMINE SALTS 15 MG TAB: 15 | 30 days supply | Qty: 30 | Fill #0

## 2016-11-25 NOTE — Assessment & Plan Note (Signed)
Continues to have tightness secondary to the injury. Patient also has a tightness of the trapezius. Patient was given trigger point injections today that "be beneficial. We discussed with patient that advance imaging would be warranted if she continues to have neck pain or any radicular symptoms I would begin. Patient understands this and will follow-up again in 2-3 weeks. No significant change in medications.

## 2016-11-25 NOTE — Assessment & Plan Note (Signed)
Repair joint injections given today. Tolerated the procedure well. Didn't feel somewhat better. Hopefully patient will be doing well we can start osteopathic manipulation again at follow-up.

## 2016-11-25 NOTE — Patient Instructions (Signed)
3 weeks.  Continue with what you are doing.

## 2016-11-25 NOTE — Assessment & Plan Note (Signed)
No improvement at this time. Discussed patient continuing the medications. We did discuss possible imaging which patient declined at this time. Patient did have the one episode of vomiting but could be secondary to her migraines. We will continue to monitor closely. Patient is to follow-up again within the next 2 weeks.

## 2016-12-07 NOTE — Progress Notes (Deleted)
[name] [place], [place] [address] Phone: [phone] Subjective:     CC: Head and neck pain after fall f/u   [id number]  Crystal [name] is a 35 y.o. female coming in with complaint of neck pain. Patient did have an injury previously was still found to have some trigger points in the right shoulder region. Once given this proximal wing 3 weeks ago. Patient was to start increasing activity. Patient states  Past Medical History:  Diagnosis Date  . Allergy   . Asthma    Past Surgical History:  Procedure Laterality Date  . HYSTEROSCOPY WITH NOVASURE N/A 05/23/2014   Procedure: Velvet Bathe;  Surgeon: Cheri Fowler, MD;  Location: [facility];  Service: Gynecology;  Laterality: N/A;  . SKIN CANCER EXCISION     Social History   Social History  . Marital status: Married    Spouse name: N/A  . Number of children: N/A  . Years of education: N/A   Social History Main Topics  . Smoking status: Never Smoker  . Smokeless tobacco: Never Used  . Alcohol use Yes  . Drug use: No  . Sexual activity: Not on file   Other Topics Concern  . Not on file   Social History Narrative   Regular exercise- yes   Allergies  Allergen Reactions  . Dextromethorphan Hbr Rash   Family History  Problem Relation Age of Onset  . Fibromyalgia Father   . Autoimmune disease Father   . Cancer Maternal Grandmother     stomach  . Diabetes Maternal Grandmother   . Diabetes Paternal Grandmother   . Heart disease Paternal Grandmother   . Alcohol abuse Paternal Grandfather   . Cancer Paternal Grandfather     lung    Past medical history, social, surgical and family history all reviewed in electronic medical record.  No pertanent information unless stated regarding to the chief complaint.   Review of Systems: No headache, visual changes, nausea, vomiting, diarrhea, constipation, dizziness, abdominal pain, skin rash, fevers, chills, night sweats, weight loss,  swollen lymph nodes, body aches, joint swelling, muscle aches, chest pain, shortness of breath, mood changes.  .   Objective  There were no vitals taken for this visit.   Systems examined below as of 12/07/16 General: NAD A&O x3 mood, affect normal  HEENT: Pupils equal, extraocular movements intact no nystagmus Respiratory: not short of breath at rest or with speaking Cardiovascular: No lower extremity edema, non tender Skin: Warm dry intact with no signs of infection or rash on extremities or on axial skeleton. Abdomen: Soft nontender, no masses Neuro: Cranial nerves  intact, neurovascularly intact in all extremities with 2+ DTRs and 2+ pulses. Lymph: No lymphadenopathy appreciated today  Gait normal with good balance and coordination.  MSK: Non tender with full range of motion and good stability and symmetric strength and tone of shoulders, elbows, wrist,  knee hips and ankles bilaterally.    Neck: Inspection unremarkable. No palpable stepoffs. Negative Spurling's maneuver. Continued tightness on the right side of the neck. Mild limitation in side bending and rotation Grip strength and sensation normal in bilateral hands Strength good C4 to T1 distribution No sensory change to C4 to T1 Negative Hoffman sign bilaterally Reflexes normal Increased tightness of the right trapezius    Impression and Recommendations:     This case required medical decision making of moderate complexity.      Note: This dictation was prepared with Dragon dictation  along with smaller phrase technology. Any transcriptional errors that result from this process are unintentional.

## 2016-12-08 ENCOUNTER — Ambulatory Visit: Payer: 59 | Admitting: Family Medicine

## 2016-12-21 NOTE — Progress Notes (Deleted)
[name] [place], [place] [address] Phone: [phone] Subjective:     CC: Head and neck pain after fall f/u   [id number]  Crystal [name] is a 35 y.o. female coming in with complaint of neck pain. Patient did have an injury previously was still found to have some trigger points in the right shoulder region. Once given this proximal wing 3 weeks ago. Patient was to start increasing activity. Patient states  Past Medical History:  Diagnosis Date  . Allergy   . Asthma    Past Surgical History:  Procedure Laterality Date  . HYSTEROSCOPY WITH NOVASURE N/A 05/23/2014   Procedure: Velvet Bathe;  Surgeon: Cheri Fowler, MD;  Location: [facility];  Service: Gynecology;  Laterality: N/A;  . SKIN CANCER EXCISION     Social History   Social History  . Marital status: Married    Spouse name: N/A  . Number of children: N/A  . Years of education: N/A   Social History Main Topics  . Smoking status: Never Smoker  . Smokeless tobacco: Never Used  . Alcohol use Yes  . Drug use: No  . Sexual activity: Not on file   Other Topics Concern  . Not on file   Social History Narrative   Regular exercise- yes   Allergies  Allergen Reactions  . Dextromethorphan Hbr Rash   Family History  Problem Relation Age of Onset  . Fibromyalgia Father   . Autoimmune disease Father   . Cancer Maternal Grandmother     stomach  . Diabetes Maternal Grandmother   . Diabetes Paternal Grandmother   . Heart disease Paternal Grandmother   . Alcohol abuse Paternal Grandfather   . Cancer Paternal Grandfather     lung    Past medical history, social, surgical and family history all reviewed in electronic medical record.  No pertanent information unless stated regarding to the chief complaint.   Review of Systems: No headache, visual changes, nausea, vomiting, diarrhea, constipation, dizziness, abdominal pain, skin rash, fevers, chills, night sweats, weight loss,  swollen lymph nodes, body aches, joint swelling, muscle aches, chest pain, shortness of breath, mood changes.  .   Objective  There were no vitals taken for this visit.   Systems examined below as of 12/21/16 General: NAD A&O x3 mood, affect normal  HEENT: Pupils equal, extraocular movements intact no nystagmus Respiratory: not short of breath at rest or with speaking Cardiovascular: No lower extremity edema, non tender Skin: Warm dry intact with no signs of infection or rash on extremities or on axial skeleton. Abdomen: Soft nontender, no masses Neuro: Cranial nerves  intact, neurovascularly intact in all extremities with 2+ DTRs and 2+ pulses. Lymph: No lymphadenopathy appreciated today  Gait normal with good balance and coordination.  MSK: Non tender with full range of motion and good stability and symmetric strength and tone of shoulders, elbows, wrist,  knee hips and ankles bilaterally.    Neck: Inspection unremarkable. No palpable stepoffs. Negative Spurling's maneuver. Continued tightness on the right side of the neck. Mild limitation in side bending and rotation Grip strength and sensation normal in bilateral hands Strength good C4 to T1 distribution No sensory change to C4 to T1 Negative Hoffman sign bilaterally Reflexes normal Increased tightness of the right trapezius    Impression and Recommendations:     This case required medical decision making of moderate complexity.      Note: This dictation was prepared with Dragon dictation  along with smaller phrase technology. Any transcriptional errors that result from this process are unintentional.

## 2016-12-22 ENCOUNTER — Ambulatory Visit: Payer: 59 | Admitting: Family Medicine

## 2016-12-26 NOTE — L&D Delivery Note (Signed)
Delivery Note At 7:07 AM a viable female was delivered via Vaginal, Spontaneous Delivery (Presentation: vtx).  APGAR: 9, 10; weight 6 lb 4.2 oz (2840 g).   Placenta status: spontaneous, intact.  Cord:  with the following complications: none.  Anesthesia:  Epidural Episiotomy: None Lacerations: Periurethral Suture Repair: none Est. Blood Loss (mL): 150  Mom to postpartum.  Baby to Couplet care / Skin to Skin.  Will do circumcision tomorrow.  Delivery was by Dr. Marvel Plan, placenta by Dr. Terri Piedra.  Blane Ohara Jalena Vanderlinden 10/03/2017, 7:24 AM

## 2016-12-26 NOTE — L&D Delivery Note (Signed)
Delivery Note I was walking by room and pt had progresed rapidly to complete dilation.  FHR having some intermittent decelerations.  Pt was c/c/+3 so instructed to push and at 7:07 AM a viable female was delivered via Vaginal, Spontaneous Delivery (Presentation: OA  ).  APGAR: 9, 10; weight 6 lb 4.2 oz (2840 g).   Cord clamped and cut and baby with good cry.  Dr.Banga took over to deliver the placenta  Anesthesia:  epidural Episiotomy: None Lacerations: Periurethral Suture Repair: N?A Est. Blood Loss (mL): 150  Mom to postpartum.  Baby to Couplet care / Skin to Skin.  Crystal [name] 10/03/2017, 7:24 AM

## 2017-01-03 MED FILL — DEXTROAMP-AMPHETAMINE 5 MG: 5 | 30 days supply | Qty: 30 | Fill #0

## 2017-01-03 MED FILL — AMPHETAMINE SALTS 15 MG TAB: 15 | 30 days supply | Qty: 30 | Fill #0

## 2017-01-05 MED FILL — LEVOTHYROXINE 50 MCG TABLET: 50 | 30 days supply | Qty: 30 | Fill #2

## 2017-01-16 ENCOUNTER — Encounter: Payer: Self-pay | Admitting: Family Medicine

## 2017-01-16 ENCOUNTER — Ambulatory Visit (INDEPENDENT_AMBULATORY_CARE_PROVIDER_SITE_OTHER): Payer: 59 | Admitting: Family Medicine

## 2017-01-16 VITALS — BP 128/84 | HR 93 | Ht 64.0 in | Wt 160.0 lb

## 2017-01-16 DIAGNOSIS — M999 Biomechanical lesion, unspecified: Secondary | ICD-10-CM | POA: Diagnosis not present

## 2017-01-16 DIAGNOSIS — M533 Sacrococcygeal disorders, not elsewhere classified: Secondary | ICD-10-CM

## 2017-01-16 NOTE — Assessment & Plan Note (Signed)
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 Patient did have some new hamstring tightness. We discussed home exercises and given different exercises today. We discussed icing regimen. Topical anti-inflammatories and over-the-counter medications. Follow-up again in 4 weeks.

## 2017-01-16 NOTE — Assessment & Plan Note (Signed)
Decision today to treat with OMT was based on Physical Exam  After verbal consent patient was treated with HVLA, ME, FPR techniques in cervical, thoracic, lumbar and sacral areas  Patient tolerated the procedure well with improvement in symptoms  Patient given exercises, stretches and lifestyle modifications  See medications in patient instructions if given  Patient will follow up in 3-4 weeks  

## 2017-01-16 NOTE — Progress Notes (Signed)
[name] [name], [place] [address] Phone: [phone] Subjective:     CC: Head and neck pain after fall f/u   [id number]  Crystal [name] is a 36 y.o. female coming in with complaint of neck pain. Patient did have an injury previously was still found to have some trigger points in the right shoulder region. Patient states that that seem to be doing better. Had another 2 falls recently. Patient states that this is caused more increased back pain. Patient states that seems to be more in the lower back. No radiation down the leg. No numbness.   Past Medical History:  Diagnosis Date  . Allergy   . Asthma    Past Surgical History:  Procedure Laterality Date  . HYSTEROSCOPY WITH NOVASURE N/A 05/23/2014   Procedure: Velvet Bathe;  Surgeon: Cheri Fowler, MD;  Location: [facility];  Service: Gynecology;  Laterality: N/A;  . SKIN CANCER EXCISION     Social History   Social History  . Marital status: Married    Spouse name: N/A  . Number of children: N/A  . Years of education: N/A   Social History Main Topics  . Smoking status: Never Smoker  . Smokeless tobacco: Never Used  . Alcohol use Yes  . Drug use: No  . Sexual activity: Not Asked   Other Topics Concern  . None   Social History Narrative   Regular exercise- yes   Allergies  Allergen Reactions  . Dextromethorphan Hbr Rash   Family History  Problem Relation Age of Onset  . Fibromyalgia Father   . Autoimmune disease Father   . Cancer Maternal Grandmother     stomach  . Diabetes Maternal Grandmother   . Diabetes Paternal Grandmother   . Heart disease Paternal Grandmother   . Alcohol abuse Paternal Grandfather   . Cancer Paternal Grandfather     lung    Past medical history, social, surgical and family history all reviewed in electronic medical record.  No pertanent information unless stated regarding to the chief complaint.   Review of Systems: No headache, visual  changes, nausea, vomiting, diarrhea, constipation, dizziness, abdominal pain, skin rash, fevers, chills, night sweats, weight loss, swollen lymph nodes, body aches, joint swelling, muscle aches, chest pain, shortness of breath, mood changes.    .   Objective  Blood pressure 128/84, pulse 93, height 5\' 4"  (1.626 m), weight 160 lb (72.6 kg), SpO2 98 %.   Systems examined below as of 01/16/17 General: NAD A&O x3 mood, affect normal  HEENT: Pupils equal, extraocular movements intact no nystagmus Respiratory: not short of breath at rest or with speaking Cardiovascular: No lower extremity edema, non tender Skin: Warm dry intact with no signs of infection or rash on extremities or on axial skeleton. Abdomen: Soft nontender, no masses Neuro: Cranial nerves  intact, neurovascularly intact in all extremities with 2+ DTRs and 2+ pulses. Lymph: No lymphadenopathy appreciated today  Gait normal with good balance and coordination.  MSK: Non tender with full range of motion and good stability and symmetric strength and tone of shoulders, elbows, wrist,  knee hips and ankles bilaterally.    Neck: Inspection unremarkable. No palpable stepoffs. Negative Spurling's maneuver. Mild tightness noted. Grip strength and sensation normal in bilateral hands Strength good C4 to T1 distribution No sensory change to C4 to T1 Negative Hoffman sign bilaterally Reflexes normal  Back Exam:  Inspection: Unremarkable  Motion: Flexion 45 deg, Extension 25 deg, Side  Bending to 45 deg bilaterally,  Rotation to 45 deg bilaterally  SLR laying: Negative  XSLR laying: Negative  Palpable tenderness: Tenderness to palpation of the hamstrings as well as the lower back and lumbar region mostly over the bilateral sacroiliac joints. FABER: negative. Sensory change: Gross sensation intact to all lumbar and sacral dermatomes.  Reflexes: 2+ at both patellar tendons, 2+ at achilles tendons, Babinski's downgoing.  Strength at  foot  Plantar-flexion: 5/5 Dorsi-flexion: 5/5 Eversion: 5/5 Inversion: 5/5  Leg strength  Quad: 5/5 Hamstring: 5/5 Hip flexor: 5/5 Hip abductors: 4/5  Gait unremarkable.  Osteopathic findings Cervical C2 flexed rotated and side bent right C6 flexed rotated and side bent left T3 extended rotated and side bent right inhaled third rib T9 extended rotated and side bent left L2 flexed rotated and side bent right Sacrum right on right Hamstring subluxation noted proximally right side.    Impression and Recommendations:     This case required medical decision making of moderate complexity.      Note: This dictation was prepared with Dragon dictation along with smaller phrase technology. Any transcriptional errors that result from this process are unintentional.

## 2017-01-16 NOTE — Patient Instructions (Signed)
Good to see you  Ice is your friend Askling exercises could be good See me again in 4 weeks.

## 2017-02-10 MED FILL — LEVOTHYROXINE 50 MCG TABLET: 50 | 30 days supply | Qty: 30 | Fill #3

## 2017-02-15 MED FILL — SULFAMETHOXAZOLE/TMP DS TAB: 800-160 | 3 days supply | Qty: 6 | Fill #0

## 2017-02-21 MED FILL — CEPHALEXIN 500 MG CAPSULE: 500 | 7 days supply | Qty: 21 | Fill #0

## 2017-03-09 MED FILL — LEVOTHYROXINE 50 MCG TABLET: 50 | 30 days supply | Qty: 30 | Fill #4

## 2017-03-21 LAB — OB RESULTS CONSOLE ANTIBODY SCREEN: Antibody Screen: NEGATIVE

## 2017-03-21 LAB — OB RESULTS CONSOLE ABO/RH: RH Type: POSITIVE

## 2017-03-21 LAB — OB RESULTS CONSOLE HIV ANTIBODY (ROUTINE TESTING): HIV: NONREACTIVE

## 2017-03-21 LAB — OB RESULTS CONSOLE GC/CHLAMYDIA
Chlamydia: NEGATIVE
GC PROBE AMP, GENITAL: NEGATIVE

## 2017-03-21 LAB — OB RESULTS CONSOLE RPR: RPR: NONREACTIVE

## 2017-03-21 LAB — OB RESULTS CONSOLE HEPATITIS B SURFACE ANTIGEN: Hepatitis B Surface Ag: NEGATIVE

## 2017-03-21 LAB — OB RESULTS CONSOLE RUBELLA ANTIBODY, IGM: Rubella: IMMUNE

## 2017-05-05 MED FILL — ONE TOUCH DELICA 33G LANCET: 25 days supply | Qty: 100 | Fill #0

## 2017-05-05 MED FILL — ONETOUCH VERIO TEST STRIP: 25 days supply | Qty: 100 | Fill #0

## 2017-05-25 MED FILL — metFORMIN HCL 500 MG TABS: 500 | 30 days supply | Qty: 30 | Fill #0

## 2017-06-20 MED FILL — ONE TOUCH DELICA 33G LANCET: 25 days supply | Qty: 100 | Fill #1

## 2017-06-20 MED FILL — ONETOUCH VERIO TEST STRIP: 25 days supply | Qty: 100 | Fill #1

## 2017-06-23 MED FILL — metFORMIN HCL 500 MG TABS: 500 | 30 days supply | Qty: 30 | Fill #1

## 2017-07-12 MED FILL — metFORMIN HCL 500 MG TABS: 500 | 30 days supply | Qty: 60 | Fill #0

## 2017-07-14 MED FILL — CYCLOBENZAPRINE 10 MG TABLE: 10 | 10 days supply | Qty: 30 | Fill #0

## 2017-07-26 MED FILL — ONETOUCH VERIO TEST STRIP: 25 days supply | Qty: 100 | Fill #2

## 2017-08-10 MED FILL — metFORMIN HCL 500 MG TABS: 500 | 30 days supply | Qty: 60 | Fill #1

## 2017-08-16 MED FILL — ZOLPIDEM TARTRATE 5 MG TAB: 5 | 20 days supply | Qty: 20 | Fill #0

## 2017-08-16 MED FILL — metFORMIN HCL 500 MG TABS: 500 | 30 days supply | Qty: 90 | Fill #0

## 2017-08-24 ENCOUNTER — Ambulatory Visit (INDEPENDENT_AMBULATORY_CARE_PROVIDER_SITE_OTHER): Payer: 59 | Admitting: Family Medicine

## 2017-08-24 ENCOUNTER — Encounter: Payer: Self-pay | Admitting: Family Medicine

## 2017-08-24 VITALS — BP 112/82 | HR 68 | Wt 174.0 lb

## 2017-08-24 DIAGNOSIS — M999 Biomechanical lesion, unspecified: Secondary | ICD-10-CM

## 2017-08-24 DIAGNOSIS — M533 Sacrococcygeal disorders, not elsewhere classified: Secondary | ICD-10-CM

## 2017-08-24 MED FILL — ONETOUCH VERIO TEST STRIP: 25 days supply | Qty: 100 | Fill #3

## 2017-08-24 MED FILL — ONE TOUCH DELICA 33G LANCET: 25 days supply | Qty: 100 | Fill #2

## 2017-08-24 NOTE — Assessment & Plan Note (Signed)
Decision today to treat with OMT was based on Physical Exam  After verbal consent patient was treated with  ME, FPR techniques in  thoracic, lumbar and sacral areas  Patient tolerated the procedure well with improvement in symptoms  Patient given exercises, stretches and lifestyle modifications  See medications in patient instructions if given  Patient will follow up in 2 weeks

## 2017-08-24 NOTE — Patient Instructions (Signed)
See me again in 2 weeks.  You look again

## 2017-08-24 NOTE — Assessment & Plan Note (Signed)
Patient is doing very well. [redacted] weeks pregnant. We'll have some increasing lumbar lordosis over the course the next several weeks. Discussed with patient about trying to limit certain activities. We discussed icing regimen and the importance of staying hydrated. Follow-up again in 2 weeks for further evaluation and treatment.

## 2017-08-24 NOTE — Progress Notes (Signed)
[name] [place], [place] [address] Phone: [phone] Subjective:    I'm seeing this patient by the request  of:    CC: Low back pain  VOZ:[id number]  Crystal [name] is a 36 y.o. female coming in with complaint of low back pain. Patient is [redacted] weeks pregnant. Noticing some increasing tension in the back. Has had difficulty previously. Patient is starting to have this affect daily activities. Some intermittent radiation down the right leg. No numbness or weakness though. Patient states very uncomfortable at night as well. Has not taken any over-the-counter medications for it.      Past Medical History:  Diagnosis Date  . Allergy   . Asthma    Past Surgical History:  Procedure Laterality Date  . HYSTEROSCOPY WITH NOVASURE N/A 05/23/2014   Procedure: Velvet Bathe;  Surgeon: Cheri Fowler, MD;  Location: [facility];  Service: Gynecology;  Laterality: N/A;  . SKIN CANCER EXCISION     Social History   Social History  . Marital status: Married    Spouse name: N/A  . Number of children: N/A  . Years of education: N/A   Social History Main Topics  . Smoking status: Never Smoker  . Smokeless tobacco: Never Used  . Alcohol use Yes  . Drug use: No  . Sexual activity: Not Asked   Other Topics Concern  . None   Social History Narrative   Regular exercise- yes   Allergies  Allergen Reactions  . Dextromethorphan Hbr Rash   Family History  Problem Relation Age of Onset  . Fibromyalgia Father   . Autoimmune disease Father   . Cancer Maternal Grandmother        stomach  . Diabetes Maternal Grandmother   . Diabetes Paternal Grandmother   . Heart disease Paternal Grandmother   . Alcohol abuse Paternal Grandfather   . Cancer Paternal Grandfather        lung     Past medical history, social, surgical and family history all reviewed in electronic medical record.  No pertanent information unless stated regarding to the chief  complaint.   Review of Systems:Review of systems updated and as accurate as of 08/24/17  No headache, visual changes, nausea, vomiting, diarrhea, constipation, dizziness, abdominal pain, skin rash, fevers, chills, night sweats, weight loss, swollen lymph nodes, body aches, joint swelling, muscle aches, chest pain, shortness of breath, mood changes.   Objective  Blood pressure 112/82, pulse 68, weight 174 lb (78.9 kg). Systems examined below as of 08/24/17   General: No apparent distress alert and oriented x3 mood and affect normal, dressed appropriately.  HEENT: Pupils equal, extraocular movements intact  Respiratory: Patient's speak in full sentences and does not appear short of breath  Cardiovascular: No lower extremity edema, non tender, no erythema  Skin: Warm dry intact with no signs of infection or rash on extremities or on axial skeleton.  Abdomen: Soft nontender Gravida Neuro: Cranial nerves II through XII are intact, neurovascularly intact in all extremities with 2+ DTRs and 2+ pulses.  Lymph: No lymphadenopathy of posterior or anterior cervical chain or axillae bilaterally.  Gait normal with good balance and coordination. Mild external rotation of the legs bilaterally MSK:  Non tender with full range of motion and good stability and symmetric strength and tone of shoulders, elbows, wrist, hip, knee and ankles bilaterally.  Back Exam:  Inspection: Increasing lordosis Motion: Flexion 30 deg, Extension 15 deg, more pain over the right sacroiliac  joint Side Bending to 35 deg bilaterally,  Rotation to 35 deg bilaterally  SLR laying: Negative  XSLR laying: Negative  Palpable tenderness: Tender to palpation and appears palmar musculature lumbar spine as well as the right sacroiliac joint. FABER: Tightness bilaterally. Sensory change: Gross sensation intact to all lumbar and sacral dermatomes.  Reflexes: 2+ at both patellar tendons, 2+ at achilles tendons, Babinski's downgoing.    Strength at foot  Plantar-flexion: 5/5 Dorsi-flexion: 5/5 Eversion: 5/5 Inversion: 5/5  Leg strength  Quad: 5/5 Hamstring: 5/5 Hip flexor: 5/5 Hip abductors: 5/5   Osteopathic findings C2 flexed rotated and side bent right T3 extended rotated and side bent right inhaled third rib T11 extended rotated and side bent left L3 flexed rotated and side bent right Sacrum right on right    Impression and Recommendations:     This case required medical decision making of moderate complexity.      Note: This dictation was prepared with Dragon dictation along with smaller phrase technology. Any transcriptional errors that result from this process are unintentional.

## 2017-09-04 ENCOUNTER — Ambulatory Visit: Payer: 59 | Admitting: Family Medicine

## 2017-09-05 ENCOUNTER — Encounter: Payer: Self-pay | Admitting: Family Medicine

## 2017-09-05 ENCOUNTER — Ambulatory Visit (INDEPENDENT_AMBULATORY_CARE_PROVIDER_SITE_OTHER): Payer: 59 | Admitting: Family Medicine

## 2017-09-05 VITALS — BP 120/82 | HR 86 | Ht 64.0 in

## 2017-09-05 DIAGNOSIS — M533 Sacrococcygeal disorders, not elsewhere classified: Secondary | ICD-10-CM | POA: Diagnosis not present

## 2017-09-05 DIAGNOSIS — M999 Biomechanical lesion, unspecified: Secondary | ICD-10-CM

## 2017-09-05 NOTE — Patient Instructions (Signed)
You are awesome Good luck with armageddon See me again in 2 weeks.

## 2017-09-05 NOTE — Progress Notes (Signed)
[name] [address] [place], [place] [address] Phone: [phone] Subjective:     CC: Low back pain  BBC:[id number]  Crystal [name] is a 36 y.o. female coming in with complaint of low back pain. Patient is down [redacted] weeks pregnant. Continues to have pain. Patient is having an aching sensation. Denies any radiation down the legs, denies any numbness. Still states that it can be severe overall. Sometimes waking her up at night. Is having contractions but very intermittently.     Past Medical History:  Diagnosis Date  . Allergy   . Asthma    Past Surgical History:  Procedure Laterality Date  . HYSTEROSCOPY WITH NOVASURE N/A 05/23/2014   Procedure: Velvet Bathe;  Surgeon: Cheri Fowler, MD;  Location: [facility];  Service: Gynecology;  Laterality: N/A;  . SKIN CANCER EXCISION     Social History   Social History  . Marital status: Married    Spouse name: N/A  . Number of children: N/A  . Years of education: N/A   Social History Main Topics  . Smoking status: Never Smoker  . Smokeless tobacco: Never Used  . Alcohol use Yes  . Drug use: No  . Sexual activity: Not Asked   Other Topics Concern  . None   Social History Narrative   Regular exercise- yes   Allergies  Allergen Reactions  . Dextromethorphan Hbr Rash   Family History  Problem Relation Age of Onset  . Fibromyalgia Father   . Autoimmune disease Father   . Cancer Maternal Grandmother        stomach  . Diabetes Maternal Grandmother   . Diabetes Paternal Grandmother   . Heart disease Paternal Grandmother   . Alcohol abuse Paternal Grandfather   . Cancer Paternal Grandfather        lung     Past medical history, social, surgical and family history all reviewed in electronic medical record.  No pertanent information unless stated regarding to the chief complaint.   Review of Systems:Review of systems updated and as accurate as of 09/05/17  No headache, visual changes, nausea,  vomiting, diarrhea, constipation, dizziness, abdominal pain, skin rash, fevers, chills, night sweats, weight loss, swollen lymph nodes, body aches, joint swelling,  chest pain, shortness of breath, mood changes. Positive muscle aches  Objective  Blood pressure 120/82, pulse 86, height 5\' 4"  (1.626 m), SpO2 97 %. Systems examined below as of 09/05/17   General: No apparent distress alert and oriented x3 mood and affect normal, dressed appropriately.  HEENT: Pupils equal, extraocular movements intact  Respiratory: Patient's speak in full sentences and does not appear short of breath  Cardiovascular: No lower extremity edema, non tender, no erythema  Skin: Warm dry intact with no signs of infection or rash on extremities or on axial skeleton.  Abdomen: Soft nontender Gravida Neuro: Cranial nerves II through XII are intact, neurovascularly intact in all extremities with 2+ DTRs and 2+ pulses.  Lymph: No lymphadenopathy of posterior or anterior cervical chain or axillae bilaterally.  Gait normal with good balance and coordination.  MSK:  Non tender with full range of motion and good stability and symmetric strength and tone of shoulders, elbows, wrist, hip, knee and ankles bilaterally.  Back Exam:  Inspection: Increasing lordosis Motion: Flexion 45 deg, Extension 35 deg, Side Bending to 45 deg bilaterally,  Rotation to 45 deg bilaterally  SLR laying: Negative  XSLR laying: Negative  Palpable tenderness: Tender in the paraspinal musculature.  FABER: Significant tightness bilaterally. Sensory change: Gross sensation intact to all lumbar and sacral dermatomes.  Reflexes: 2+ at both patellar tendons, 2+ at achilles tendons, Babinski's downgoing.  Strength at foot  Plantar-flexion: 5/5 Dorsi-flexion: 5/5 Eversion: 5/5 Inversion: 5/5  Leg strength  Quad: 5/5 Hamstring: 5/5 Hip flexor: 5/5 Hip abductors: 4/5  Gait unremarkable.    Impression and Recommendations:     This case required medical  decision making of moderate complexity.      Note: This dictation was prepared with Dragon dictation along with smaller phrase technology. Any transcriptional errors that result from this process are unintentional.

## 2017-09-05 NOTE — Assessment & Plan Note (Signed)
Decision today to treat with OMT was based on Physical Exam  After verbal consent patient was treated with HVLA, ME, FPR techniques in cervical, thoracic, lumbar and sacral areas  Patient tolerated the procedure well with improvement in symptoms  Patient given exercises, stretches and lifestyle modifications  See medications in patient instructions if given  Patient will follow up in 2 weeks

## 2017-09-05 NOTE — Assessment & Plan Note (Signed)
Worsening symptoms with patient's pregnancy. Discussed that we will continue to see her every 2 weeks. Stay away from any anti-inflammatories. We discussed icing regimen, we discussed objective is a doing which ones to avoid. Patient will see me again probably the next 2 weeks for 2 more weeks and likely will have her child. Do not expect any significant complications.

## 2017-09-07 LAB — OB RESULTS CONSOLE GBS: GBS: NEGATIVE

## 2017-09-12 ENCOUNTER — Observation Stay (HOSPITAL_COMMUNITY)
Admission: AD | Admit: 2017-09-12 | Discharge: 2017-09-13 | Disposition: A | Discharge status: Home / Self Care | Payer: 59 | Source: Ambulatory Visit | Attending: Obstetrics and Gynecology | Admitting: Obstetrics and Gynecology

## 2017-09-12 ENCOUNTER — Encounter (HOSPITAL_COMMUNITY): Payer: Self-pay | Admitting: *Deleted

## 2017-09-12 DIAGNOSIS — O26893 Other specified pregnancy related conditions, third trimester: Principal | ICD-10-CM | POA: Insufficient documentation

## 2017-09-12 DIAGNOSIS — O99513 Diseases of the respiratory system complicating pregnancy, third trimester: Secondary | ICD-10-CM | POA: Insufficient documentation

## 2017-09-12 DIAGNOSIS — Z85828 Personal history of other malignant neoplasm of skin: Secondary | ICD-10-CM | POA: Insufficient documentation

## 2017-09-12 DIAGNOSIS — J45909 Unspecified asthma, uncomplicated: Secondary | ICD-10-CM | POA: Diagnosis not present

## 2017-09-12 DIAGNOSIS — O24414 Gestational diabetes mellitus in pregnancy, insulin controlled: Secondary | ICD-10-CM | POA: Insufficient documentation

## 2017-09-12 DIAGNOSIS — IMO0002 Reserved for concepts with insufficient information to code with codable children: Secondary | ICD-10-CM | POA: Diagnosis present

## 2017-09-12 DIAGNOSIS — O26899 Other specified pregnancy related conditions, unspecified trimester: Secondary | ICD-10-CM | POA: Diagnosis present

## 2017-09-12 DIAGNOSIS — Z3A36 36 weeks gestation of pregnancy: Secondary | ICD-10-CM | POA: Diagnosis not present

## 2017-09-12 LAB — TYPE AND SCREEN
ABO/RH(D): A POS
Antibody Screen: NEGATIVE

## 2017-09-12 LAB — CBC
HEMATOCRIT: 34.1 % — AB (ref 36.0–46.0)
HEMOGLOBIN: 11.6 g/dL — AB (ref 12.0–15.0)
MCH: 31.5 pg (ref 26.0–34.0)
MCHC: 34 g/dL (ref 30.0–36.0)
MCV: 92.7 fL (ref 78.0–100.0)
Platelets: 207 10*3/uL (ref 150–400)
RBC: 3.68 MIL/uL — ABNORMAL LOW (ref 3.87–5.11)
RDW: 13.8 % (ref 11.5–15.5)
WBC: 12.2 10*3/uL — ABNORMAL HIGH (ref 4.0–10.5)

## 2017-09-12 LAB — GLUCOSE, CAPILLARY: Glucose-Capillary: 97 mg/dL (ref 65–99)

## 2017-09-12 LAB — ABO/RH: ABO/RH(D): A POS

## 2017-09-12 MED ORDER — ACETAMINOPHEN 325 MG PO TABS: 650.0000 mg | ORAL_TABLET | ORAL | Status: DC | PRN

## 2017-09-12 MED ORDER — LACTATED RINGERS IV SOLN
INTRAVENOUS | Status: DC
  Administered 2017-09-12: 20:00:00 via INTRAVENOUS
  Administered 2017-09-12: 125 mL via INTRAVENOUS
  Administered 2017-09-13: 08:00:00 via INTRAVENOUS

## 2017-09-12 MED ORDER — DOCUSATE SODIUM 100 MG PO CAPS: 100.0000 mg | ORAL_CAPSULE | Freq: Every day | ORAL | Status: DC

## 2017-09-12 MED ORDER — SOD CITRATE-CITRIC ACID 500-334 MG/5ML PO SOLN
ORAL | Status: AC
  Filled 2017-09-12: qty 15

## 2017-09-12 MED ORDER — METFORMIN HCL 500 MG PO TABS
500.0000 mg | ORAL_TABLET | Freq: Every day | ORAL | Status: DC
  Administered 2017-09-13: 500 mg via ORAL
  Filled 2017-09-12: qty 1

## 2017-09-12 MED ORDER — BETAMETHASONE SOD PHOS & ACET 6 (3-3) MG/ML IJ SUSP
12.0000 mg | INTRAMUSCULAR | Status: AC
  Administered 2017-09-12 – 2017-09-13 (×2): 12 mg via INTRAMUSCULAR
  Filled 2017-09-12 (×2): qty 2

## 2017-09-12 MED ORDER — CALCIUM CARBONATE ANTACID 500 MG PO CHEW: 2.0000 | CHEWABLE_TABLET | ORAL | Status: DC | PRN

## 2017-09-12 MED ORDER — METFORMIN HCL 500 MG PO TABS
1000.0000 mg | ORAL_TABLET | Freq: Every day | ORAL | Status: DC
  Administered 2017-09-12: 1000 mg via ORAL
  Filled 2017-09-12: qty 2

## 2017-09-12 MED ORDER — PRENATAL MULTIVITAMIN CH: 1.0000 | ORAL_TABLET | Freq: Every day | ORAL | Status: DC

## 2017-09-12 MED ORDER — ZOLPIDEM TARTRATE 5 MG PO TABS
5.0000 mg | ORAL_TABLET | Freq: Every evening | ORAL | Status: DC | PRN
  Administered 2017-09-13: 5 mg via ORAL
  Filled 2017-09-12: qty 1

## 2017-09-12 NOTE — H&P (Signed)
Crystal [name] is a 36 y.o. female, [id number], EGA [redacted]W[redacted]D presenting for extended fetal monitoring.  She was seen in the office today for routine visit, had NST for A2GDM with what appeared to be baseline 150s, mod variability, fairly frequent variable decels, no accels.  BPP in the office 8/8 with AFV 13.  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  Also AMA, Panorama low risk female, hypothyroidism.    OB History    Gravida Para Term Preterm AB Living   5 3 3     3    SAB TAB Ectopic Multiple Live Births           3     Past Medical History:  Diagnosis Date  . Allergy   . Asthma    Past Surgical History:  Procedure Laterality Date  . HYSTEROSCOPY WITH NOVASURE N/A 05/23/2014   Procedure: Velvet Bathe;  Surgeon: Cheri Fowler, MD;  Location: [facility];  Service: Gynecology;  Laterality: N/A;  . SKIN CANCER EXCISION     Family History: family history includes Alcohol abuse in her paternal grandfather; Autoimmune disease in her father; Cancer in her maternal grandmother and paternal grandfather; Diabetes in her maternal grandmother and paternal grandmother; Fibromyalgia in her father; Heart disease in her paternal grandmother. Social History:  reports that she has never smoked. She has never used smokeless tobacco. She reports that she drinks alcohol. She reports that she does not use drugs.     Maternal Diabetes: Yes:  Diabetes Type:  Insulin/Medication controlled Genetic Screening: Normal Maternal Ultrasounds/Referrals: Normal Fetal Ultrasounds or other Referrals:  None Maternal Substance Abuse:  No Significant Maternal Medications:  Meds include: Other:  Significant Maternal Lab Results:  Lab values include: Group B Strep negative Other Comments:  on Metformin for GDM  Review of Systems  Respiratory: Positive for cough.   Cardiovascular: Negative.    Maternal Medical History:  Contractions: Frequency: irregular.   Perceived severity is mild.    Fetal activity:  Perceived fetal activity is normal.    Prenatal Complications - Diabetes: gestational. Diabetes is managed by oral agent (monotherapy).        Blood pressure 128/74, pulse 78, temperature 98 F (36.7 C), temperature source Oral, resp. rate 20, height 5\' 4"  (1.626 m), weight 174 lb (78.9 kg). Maternal Exam:  Uterine Assessment: Contraction strength is mild.  Contraction frequency is irregular.   Abdomen: Patient reports no abdominal tenderness. Estimated fetal weight is 7 lbs.   Fetal presentation: vertex  Introitus: Normal vulva. Normal vagina.  Amniotic fluid character: not assessed.  Pelvis: adequate for delivery.      Fetal Exam Fetal Monitor Review: Mode: ultrasound.   Baseline rate: 120s.  Variability: moderate (6-25 bpm).   Pattern: accelerations present and no decelerations.    Fetal State Assessment: Category I - tracings are normal.     Physical Exam  Vitals reviewed. Constitutional: She appears well-developed and well-nourished.  Cardiovascular: Normal rate and regular rhythm.   Respiratory: Effort normal. No respiratory distress.    Prenatal labs: ABO, Rh: --/--/A POS (09/18 1610) Antibody: NEG (09/18 1610) Rubella: Immune (03/27 0000) RPR: Nonreactive (03/27 0000)  HBsAg: Negative (03/27 0000)  HIV: Non-reactive (03/27 0000)  GBS: Negative (09/13 0000)   Assessment/Plan: IUP at 36+ weeks, GDM, NR NST with variable decels in the office today with 8/8 BPP.  Will admit overnight for extended monitoring, give betamethasone for fetal lung maturation.  Since Crystal [name] looks ok here so far, will let her eat  diabetic diet, continue Metformin and CBGs.     Crystal [name] 09/12/2017, 7:28 PM

## 2017-09-12 NOTE — Progress Notes (Signed)
Patient ID: Crystal [name], female   DOB: 1981-10-19, 36 y.o.   MRN: [id number]   Admitted after 8/8 BPP and NST with variables No c/o's  AFVSS gen NAD FHTs 130-140s mod var, + accels, category 1 toco occ ctx (q 2-4 min) vaguely appreciated  Continue cont EFM/toco Close monitoring

## 2017-09-12 NOTE — Progress Notes (Signed)
Patient ID: Crystal [name], female   DOB: April 16, 1981, 36 y.o.   MRN: [id number]   Pt admitted with repetitive variables in office on NST for DM, since presentation to [facility] increased contractions.  Feeling some ctx; getting more uncomfortable.  + FM.  Some crampy, some more "sharp"  AFVSS gen NAD FHTs 120-140's, mod var, + accels. Category 1  toco q 2-56min  SVE 2/50/-3, vtx, doesn't feel laboring - largely unchanged from office  Continue close monitoring

## 2017-09-13 DIAGNOSIS — O26893 Other specified pregnancy related conditions, third trimester: Secondary | ICD-10-CM | POA: Diagnosis not present

## 2017-09-13 LAB — GLUCOSE, CAPILLARY: Glucose-Capillary: 163 mg/dL — ABNORMAL HIGH (ref 65–99)

## 2017-09-13 MED ORDER — AZITHROMYCIN 250 MG PO TABS: ORAL_TABLET | ORAL | 0 refills | Status: DC

## 2017-09-13 NOTE — Progress Notes (Signed)
Reviewed D/C instructions with pt and husband.  Copy given to pt.  Keep scheduled NST with office on Friday.

## 2017-09-13 NOTE — Discharge Summary (Signed)
Physician Discharge Summary  Patient ID: Crystal [name] MRN: [id number] DOB/AGE: [birth year] 36 y.o.  Admit date: 09/12/2017 Discharge date: 09/13/2017  Admission Diagnoses:  Discharge Diagnoses:  Active Problems:   Abnormal fetal heart rate    Possible repetitive variable decelerations in office  Discharged Condition: good  Hospital Course: Pt was admitted for extended monitoring for possible repetitive variable decelerations in the fetal heart rate noted at office.  She has been monitored for >12 hours with a normal category 1 tracing and no decelerations noted.  Good FM.  She received betamethasone x 2 and was noted to have an elevation in her fasting blood sugar to 131 likely as a result of that.  She was d/c to home to f/u for her next NST in 2 days as scheduled.  Consults: None  Treatments: fetal monitoring  Discharge Exam: Blood pressure 111/68, pulse 86, temperature 98.2 F (36.8 C), temperature source Oral, resp. rate 16, height 5\' 4"  (1.626 m), weight 78.9 kg (174 lb). General appearance: alert and cooperative  Gravid NT  Disposition: 01-Home or Self Care  Discharge Instructions    Diet - low sodium heart healthy    Complete by:  As directed    Discharge instructions    Complete by:  As directed    Call for decreased fetal movement or regular strong contractions.  Expect your blood sugars to be a bit elevated for the next 24-48 hours from the betamethasone and continue metformin.   Increase activity slowly    Complete by:  As directed      Allergies as of 09/13/2017      Reactions   Dextromethorphan Hbr Rash      Medication List    TAKE these medications   azithromycin 250 MG tablet Commonly known as:  ZITHROMAX Z-PAK Take two tablets by mouth the first day followed by one tablet by mouth  daily for the next 4 days   metFORMIN 500 MG tablet Commonly known as:  GLUCOPHAGE Take 500 mg by mouth 2 (two) times daily with a meal.   MULTIVITAMIN PO Take 1  tablet by mouth daily.            Discharge Care Instructions        Start     Ordered   09/13/17 0000  Increase activity slowly     09/13/17 0902   09/13/17 0000  Diet - low sodium heart healthy     09/13/17 0902   09/13/17 0000  Discharge instructions    Comments:  Call for decreased fetal movement or regular strong contractions.  Expect your blood sugars to be a bit elevated for the next 24-48 hours from the betamethasone and continue metformin.   09/13/17 0902   09/13/17 0000  azithromycin (ZITHROMAX Z-PAK) 250 MG tablet     09/13/17 0902   09/12/17 0000  OB RESULT CONSOLE Group B Strep    Comments:  This external order was created through the Results Console.   09/12/17 1642   09/12/17 0000  OB RESULTS CONSOLE GC/Chlamydia    Comments:  This external order was created through the Results Console.   09/12/17 1642   09/12/17 0000  OB RESULTS CONSOLE RPR    Comments:  This external order was created through the Results Console.    09/12/17 1642   09/12/17 0000  OB RESULTS CONSOLE HIV antibody    Comments:  This external order was created through the Results Console.    09/12/17 1642  09/12/17 0000  OB RESULTS CONSOLE Rubella Antibody    Comments:  This external order was created through the Results Console.    09/12/17 1642   09/12/17 0000  OB RESULTS CONSOLE Hepatitis B surface antigen    Comments:  This external order was created through the Results Console.    09/12/17 1642   09/12/17 0000  OB RESULTS CONSOLE ABO/Rh    Comments:  This external order was created through the Results Console.    09/12/17 1642   09/12/17 0000  OB RESULTS CONSOLE Antibody Screen    Comments:  This external order was created through the Results Console.    09/12/17 1642       Signed: Logan Bores 09/13/2017, 9:03 AM

## 2017-09-13 NOTE — Progress Notes (Signed)
Second reviewer Federico Flake RNC

## 2017-09-13 NOTE — Progress Notes (Signed)
Patient ID: Crystal [name], female   DOB: April 24, 1981, 36 y.o.   MRN: [id number] Pt had mild contractions overnight, but nothing significant. FHR category 1 with no decelerations at all on prolonged monitoring.  Baseline 120.    FBS 131  Received betamethasone yesterday about 1600pm so will plan to give 2nd dose this AM prior to d/c.  D/w pt will elevate BS for a couple of days, then should go back to her baseline and to continue current dose of metformin  Keep appointment for NST in 2 days Pt requests azithromycin for her sinus infection

## 2017-09-19 ENCOUNTER — Ambulatory Visit: Payer: 59 | Admitting: Family Medicine

## 2017-09-25 ENCOUNTER — Telehealth (HOSPITAL_COMMUNITY): Payer: Self-pay | Admitting: *Deleted

## 2017-09-25 NOTE — Telephone Encounter (Signed)
Preadmission screen  

## 2017-09-27 MED FILL — metFORMIN HCL 500 MG TABS: 500 | 30 days supply | Qty: 90 | Fill #1

## 2017-09-27 MED FILL — ONETOUCH VERIO TEST STRIP: 25 days supply | Qty: 100 | Fill #4

## 2017-10-03 ENCOUNTER — Inpatient Hospital Stay (HOSPITAL_COMMUNITY)
Admission: RE | Admit: 2017-10-03 | Discharge: 2017-10-04 | DRG: 807 | Disposition: A | Discharge status: Home / Self Care | Payer: 59 | Source: Ambulatory Visit | Attending: Obstetrics and Gynecology | Admitting: Obstetrics and Gynecology

## 2017-10-03 ENCOUNTER — Inpatient Hospital Stay (HOSPITAL_COMMUNITY): Payer: 59 | Admitting: Anesthesiology

## 2017-10-03 ENCOUNTER — Encounter (HOSPITAL_COMMUNITY): Payer: Self-pay

## 2017-10-03 DIAGNOSIS — D649 Anemia, unspecified: Secondary | ICD-10-CM | POA: Diagnosis present

## 2017-10-03 DIAGNOSIS — O9902 Anemia complicating childbirth: Secondary | ICD-10-CM | POA: Diagnosis present

## 2017-10-03 DIAGNOSIS — Z3A39 39 weeks gestation of pregnancy: Secondary | ICD-10-CM

## 2017-10-03 DIAGNOSIS — O24425 Gestational diabetes mellitus in childbirth, controlled by oral hypoglycemic drugs: Secondary | ICD-10-CM | POA: Diagnosis present

## 2017-10-03 DIAGNOSIS — O26893 Other specified pregnancy related conditions, third trimester: Secondary | ICD-10-CM | POA: Diagnosis present

## 2017-10-03 DIAGNOSIS — O24415 Gestational diabetes mellitus in pregnancy, controlled by oral hypoglycemic drugs: Secondary | ICD-10-CM | POA: Diagnosis present

## 2017-10-03 LAB — CBC
HEMATOCRIT: 33.7 % — AB (ref 36.0–46.0)
HEMOGLOBIN: 11.6 g/dL — AB (ref 12.0–15.0)
MCH: 32 pg (ref 26.0–34.0)
MCHC: 34.4 g/dL (ref 30.0–36.0)
MCV: 92.8 fL (ref 78.0–100.0)
Platelets: 214 10*3/uL (ref 150–400)
RBC: 3.63 MIL/uL — ABNORMAL LOW (ref 3.87–5.11)
RDW: 13.7 % (ref 11.5–15.5)
WBC: 10.5 10*3/uL (ref 4.0–10.5)

## 2017-10-03 LAB — GLUCOSE, CAPILLARY
Glucose-Capillary: 85 mg/dL (ref 65–99)
Glucose-Capillary: 135 mg/dL — ABNORMAL HIGH (ref 65–99)
Glucose-Capillary: 126 mg/dL — ABNORMAL HIGH (ref 65–99)

## 2017-10-03 LAB — TYPE AND SCREEN
ABO/RH(D): A POS
ANTIBODY SCREEN: NEGATIVE

## 2017-10-03 LAB — RPR: RPR Ser Ql: NONREACTIVE

## 2017-10-03 MED ORDER — PHENYLEPHRINE 40 MCG/ML (10ML) SYRINGE FOR IV PUSH (FOR BLOOD PRESSURE SUPPORT)
80.0000 ug | PREFILLED_SYRINGE | INTRAVENOUS | Status: DC | PRN
  Filled 2017-10-03: qty 5

## 2017-10-03 MED ORDER — OXYCODONE-ACETAMINOPHEN 5-325 MG PO TABS: 2.0000 | ORAL_TABLET | ORAL | Status: DC | PRN

## 2017-10-03 MED ORDER — TERBUTALINE SULFATE 1 MG/ML IJ SOLN
0.2500 mg | Freq: Once | INTRAMUSCULAR | Status: DC | PRN
  Filled 2017-10-03: qty 1

## 2017-10-03 MED ORDER — EPHEDRINE 5 MG/ML INJ
10.0000 mg | INTRAVENOUS | Status: DC | PRN
  Filled 2017-10-03: qty 2

## 2017-10-03 MED ORDER — BENZOCAINE-MENTHOL 20-0.5 % EX AERO
1.0000 "application " | INHALATION_SPRAY | CUTANEOUS | Status: DC | PRN
  Filled 2017-10-03: qty 56

## 2017-10-03 MED ORDER — SIMETHICONE 80 MG PO CHEW: 80.0000 mg | CHEWABLE_TABLET | ORAL | Status: DC | PRN

## 2017-10-03 MED ORDER — PRENATAL MULTIVITAMIN CH
1.0000 | ORAL_TABLET | Freq: Every day | ORAL | Status: DC
  Filled 2017-10-03: qty 1

## 2017-10-03 MED ORDER — LIDOCAINE HCL (PF) 1 % IJ SOLN
INTRAMUSCULAR | Status: DC | PRN
  Administered 2017-10-03: 2 mL via EPIDURAL
  Administered 2017-10-03: 3 mL via EPIDURAL
  Administered 2017-10-03: 5 mL via EPIDURAL

## 2017-10-03 MED ORDER — TETANUS-DIPHTH-ACELL PERTUSSIS 5-2.5-18.5 LF-MCG/0.5 IM SUSP: 0.5000 mL | Freq: Once | INTRAMUSCULAR | Status: DC

## 2017-10-03 MED ORDER — ONDANSETRON HCL 4 MG/2ML IJ SOLN: 4.0000 mg | Freq: Four times a day (QID) | INTRAMUSCULAR | Status: DC | PRN

## 2017-10-03 MED ORDER — OXYCODONE HCL 5 MG PO TABS
5.0000 mg | ORAL_TABLET | ORAL | Status: DC | PRN
  Administered 2017-10-03 – 2017-10-04 (×2): 5 mg via ORAL
  Filled 2017-10-03 (×2): qty 1

## 2017-10-03 MED ORDER — ZOLPIDEM TARTRATE 5 MG PO TABS: 5.0000 mg | ORAL_TABLET | Freq: Every evening | ORAL | Status: DC | PRN

## 2017-10-03 MED ORDER — OXYTOCIN 40 UNITS IN LACTATED RINGERS INFUSION - SIMPLE MED
2.5000 [IU]/h | INTRAVENOUS | Status: DC
  Administered 2017-10-03: 2.5 [IU]/h via INTRAVENOUS

## 2017-10-03 MED ORDER — BUTORPHANOL TARTRATE 1 MG/ML IJ SOLN: 1.0000 mg | INTRAMUSCULAR | Status: DC | PRN

## 2017-10-03 MED ORDER — LACTATED RINGERS IV SOLN: 500.0000 mL | Freq: Once | INTRAVENOUS | Status: DC

## 2017-10-03 MED ORDER — SOD CITRATE-CITRIC ACID 500-334 MG/5ML PO SOLN: 30.0000 mL | ORAL | Status: DC | PRN

## 2017-10-03 MED ORDER — LIDOCAINE HCL (PF) 1 % IJ SOLN
30.0000 mL | INTRAMUSCULAR | Status: DC | PRN
  Filled 2017-10-03: qty 30

## 2017-10-03 MED ORDER — METHYLERGONOVINE MALEATE 0.2 MG/ML IJ SOLN: 0.2000 mg | INTRAMUSCULAR | Status: DC | PRN

## 2017-10-03 MED ORDER — MAGNESIUM HYDROXIDE 400 MG/5ML PO SUSP: 30.0000 mL | ORAL | Status: DC | PRN

## 2017-10-03 MED ORDER — ACETAMINOPHEN 325 MG PO TABS: 650.0000 mg | ORAL_TABLET | ORAL | Status: DC | PRN

## 2017-10-03 MED ORDER — OXYTOCIN BOLUS FROM INFUSION
500.0000 mL | Freq: Once | INTRAVENOUS | Status: AC
  Administered 2017-10-03: 500 mL via INTRAVENOUS

## 2017-10-03 MED ORDER — OXYCODONE-ACETAMINOPHEN 5-325 MG PO TABS: 1.0000 | ORAL_TABLET | ORAL | Status: DC | PRN

## 2017-10-03 MED ORDER — LACTATED RINGERS IV SOLN: INTRAVENOUS | Status: DC

## 2017-10-03 MED ORDER — COCONUT OIL OIL: 1.0000 "application " | TOPICAL_OIL | Status: DC | PRN

## 2017-10-03 MED ORDER — ONDANSETRON HCL 4 MG PO TABS: 4.0000 mg | ORAL_TABLET | ORAL | Status: DC | PRN

## 2017-10-03 MED ORDER — FENTANYL 2.5 MCG/ML BUPIVACAINE 1/10 % EPIDURAL INFUSION (WH - ANES)
14.0000 mL/h | INTRAMUSCULAR | Status: DC | PRN
  Administered 2017-10-03: 14 mL/h via EPIDURAL
  Filled 2017-10-03: qty 100

## 2017-10-03 MED ORDER — LACTATED RINGERS IV SOLN: 500.0000 mL | INTRAVENOUS | Status: DC | PRN

## 2017-10-03 MED ORDER — SENNOSIDES-DOCUSATE SODIUM 8.6-50 MG PO TABS
2.0000 | ORAL_TABLET | ORAL | Status: DC
  Administered 2017-10-03: 2 via ORAL
  Filled 2017-10-03: qty 2

## 2017-10-03 MED ORDER — ONDANSETRON HCL 4 MG/2ML IJ SOLN: 4.0000 mg | INTRAMUSCULAR | Status: DC | PRN

## 2017-10-03 MED ORDER — OXYTOCIN 40 UNITS IN LACTATED RINGERS INFUSION - SIMPLE MED
1.0000 m[IU]/min | INTRAVENOUS | Status: DC
  Administered 2017-10-03: 6 m[IU]/min via INTRAVENOUS
  Administered 2017-10-03: 4 m[IU]/min via INTRAVENOUS
  Administered 2017-10-03: 2 m[IU]/min via INTRAVENOUS
  Filled 2017-10-03: qty 1000

## 2017-10-03 MED ORDER — WITCH HAZEL-GLYCERIN EX PADS: 1.0000 "application " | MEDICATED_PAD | CUTANEOUS | Status: DC | PRN

## 2017-10-03 MED ORDER — METHYLERGONOVINE MALEATE 0.2 MG PO TABS: 0.2000 mg | ORAL_TABLET | ORAL | Status: DC | PRN

## 2017-10-03 MED ORDER — DIPHENHYDRAMINE HCL 50 MG/ML IJ SOLN: 12.5000 mg | INTRAMUSCULAR | Status: DC | PRN

## 2017-10-03 MED ORDER — PHENYLEPHRINE 40 MCG/ML (10ML) SYRINGE FOR IV PUSH (FOR BLOOD PRESSURE SUPPORT)
80.0000 ug | PREFILLED_SYRINGE | INTRAVENOUS | Status: DC | PRN
  Filled 2017-10-03: qty 5
  Filled 2017-10-03: qty 10

## 2017-10-03 MED ORDER — DIPHENHYDRAMINE HCL 25 MG PO CAPS: 25.0000 mg | ORAL_CAPSULE | Freq: Four times a day (QID) | ORAL | Status: DC | PRN

## 2017-10-03 MED ORDER — IBUPROFEN 600 MG PO TABS
600.0000 mg | ORAL_TABLET | Freq: Four times a day (QID) | ORAL | Status: DC
  Administered 2017-10-03 – 2017-10-04 (×3): 600 mg via ORAL
  Filled 2017-10-03 (×4): qty 1

## 2017-10-03 MED ORDER — MEASLES, MUMPS & RUBELLA VAC ~~LOC~~ INJ: 0.5000 mL | INJECTION | Freq: Once | SUBCUTANEOUS | Status: DC

## 2017-10-03 MED ORDER — DIBUCAINE 1 % RE OINT: 1.0000 "application " | TOPICAL_OINTMENT | RECTAL | Status: DC | PRN

## 2017-10-03 MED ORDER — OXYCODONE HCL 5 MG PO TABS: 10.0000 mg | ORAL_TABLET | ORAL | Status: DC | PRN

## 2017-10-03 NOTE — Anesthesia Preprocedure Evaluation (Signed)
Anesthesia Evaluation  Patient identified by MRN, date of birth, ID band Patient awake    Reviewed: Allergy & Precautions, NPO status , Patient's Chart, lab work & pertinent test results  Airway Mallampati: II  TM Distance: >3 FB Neck ROM: Full    Dental  (+) Teeth Intact, Dental Advisory Given   Pulmonary asthma ,    Pulmonary exam normal breath sounds clear to auscultation       Cardiovascular negative cardio ROS Normal cardiovascular exam Rhythm:Regular Rate:Normal     Neuro/Psych  Headaches, PSYCHIATRIC DISORDERS Anxiety    GI/Hepatic negative GI ROS, Neg liver ROS,   Endo/Other  diabetes, Gestational, Oral Hypoglycemic Agents  Renal/GU negative Renal ROS     Musculoskeletal negative musculoskeletal ROS (+)   Abdominal   Peds  Hematology  (+) Blood dyscrasia, anemia , Plt 214k   Anesthesia Other Findings Day of surgery medications reviewed with the patient.  Reproductive/Obstetrics (+) Pregnancy                             Anesthesia Physical Anesthesia Plan  ASA: II  Anesthesia Plan: Epidural   Post-op Pain Management:    Induction:   PONV Risk Score and Plan: Treatment may vary due to age or medical condition  Airway Management Planned:   Additional Equipment:   Intra-op Plan:   Post-operative Plan:   Informed Consent: I have reviewed the patients History and Physical, chart, labs and discussed the procedure including the risks, benefits and alternatives for the proposed anesthesia with the patient or authorized representative who has indicated his/her understanding and acceptance.   Dental advisory given  Plan Discussed with:   Anesthesia Plan Comments: (Patient identified. Risks/Benefits/Options discussed with patient including but not limited to bleeding, infection, nerve damage, paralysis, failed block, incomplete pain control, headache, blood pressure changes,  nausea, vomiting, reactions to medication both or allergic, itching and postpartum back pain. Confirmed with bedside nurse the patient's most recent platelet count. Confirmed with patient that they are not currently taking any anticoagulation, have any bleeding history or any family history of bleeding disorders. Patient expressed understanding and wished to proceed. All questions were answered. )        Anesthesia Quick Evaluation

## 2017-10-03 NOTE — Anesthesia Postprocedure Evaluation (Signed)
Anesthesia Post Note  Patient: Crystal [name]  Procedure(s) Performed: AN AD HOC LABOR EPIDURAL     Patient location during evaluation: Mother Baby Anesthesia Type: Epidural Level of consciousness: awake and alert and oriented Pain management: satisfactory to patient Vital Signs Assessment: post-procedure vital signs reviewed and stable Respiratory status: spontaneous breathing and nonlabored ventilation Cardiovascular status: stable Postop Assessment: no headache, no backache, no signs of nausea or vomiting, adequate PO intake and patient able to bend at knees (patient up walking) Anesthetic complications: no    Last Vitals:  Vitals:   10/03/17 0850 10/03/17 0950  BP: 119/74 120/70  Pulse: 75 78  Resp: 18 18  Temp: 37.1 C 36.9 C    Last Pain:  Vitals:   10/03/17 1342  TempSrc:   PainSc: 0-No pain   Pain Goal: Patients Stated Pain Goal: 8 (10/03/17 0512)               Willa Rough

## 2017-10-03 NOTE — H&P (Signed)
Crystal [name] is a 36 y.o. female, G5 P3013, EGA [redacted] weeks with EDC 10-13 presenting for ctx and SROM.  Pt scheduled for induction this morning, but came in in labor and had SROM.  She ahd pitocin augmentation, progressed quickly to complete, see delivery note.  Prenatal care complicated by [id number] controlled with Metformin, AMA with low risk Panorama.  OB History    Gravida Para Term Preterm AB Living   5 3 3     3    SAB TAB Ectopic Multiple Live Births           3     Past Medical History:  Diagnosis Date  . Allergy   . Asthma    PSH:  Solyx sling Skin cancer excision  Family History: family history includes Alcohol abuse in her paternal grandfather; Autoimmune disease in her father; Cancer in her maternal grandmother and paternal grandfather; Diabetes in her maternal grandmother and paternal grandmother; Fibromyalgia in her father; Heart disease in her paternal grandmother. Social History:  reports that she has never smoked. She has never used smokeless tobacco. She reports that she drinks alcohol. She reports that she does not use drugs.     Maternal Diabetes: Yes:  Diabetes Type:  Insulin/Medication controlled Genetic Screening: Normal Maternal Ultrasounds/Referrals: Normal Fetal Ultrasounds or other Referrals:  None Maternal Substance Abuse:  No Significant Maternal Medications:  None Significant Maternal Lab Results:  Lab values include: Group B Strep negative Other Comments:  None  Review of Systems  Respiratory: Negative.   Cardiovascular: Negative.    Maternal Medical History:  Reason for admission: Rupture of membranes and contractions.   Contractions: Frequency: regular.   Perceived severity is moderate.    Fetal activity: Perceived fetal activity is normal.    Prenatal complications: no prenatal complications Prenatal Complications - Diabetes: gestational. Diabetes is managed by oral agent (monotherapy).      Dilation: Lip/rim Effacement (%):  100 Station: +2 Exam by:: BVernard Gambles, RN Blood pressure 120/61, pulse 74, temperature 98.8 F (37.1 C), temperature source Oral, resp. rate 18, weight 174 lb (78.9 kg). Maternal Exam:  Uterine Assessment: Contraction strength is moderate.  Contraction frequency is regular.   Abdomen: Patient reports no abdominal tenderness. Estimated fetal weight is 8 lbs.   Fetal presentation: vertex  Introitus: Normal vulva. Normal vagina.  Amniotic fluid character: clear.  Pelvis: adequate for delivery.   Cervix: Cervix evaluated by digital exam.     Physical Exam  Vitals reviewed. Constitutional: She appears well-developed and well-nourished.  Cardiovascular: Normal rate and regular rhythm.   Respiratory: Effort normal. No respiratory distress.  GI: Soft.    Prenatal labs: ABO, Rh: --/--/A POS, A POS (09/18 1610) Antibody: NEG (09/18 1610) Rubella: @RUBELLARESULTSCONSOLE @ RPR: Nonreactive (03/27 0000)  HBsAg: Negative (03/27 0000)  HIV: Non-reactive (03/27 0000)  GBS: Negative (09/13 0000)   Assessment/Plan: IUP at 39+ weeks, A2GDM controlled with Metformin, in active labor with SROM with rapid labor.  See delivery note.     Crystal [name] 10/03/2017, 7:18 AM

## 2017-10-03 NOTE — Progress Notes (Signed)
Patient ID: Crystal [name], female   DOB: 06-14-1981, 36 y.o.   MRN: [id number] Late entry Pt arrived to L/D ahead of scheduled iol due to painful contractions. Noted to be 2-3cm dil. She spontanously ruptured during admission - clear fluid and contractions spaced out Cat 1 strip  Plan: Multip at term in latent labor-stable          Pt admitted for expectant mgmt          May have epidural/stadol/nitrous for pain relief prn          Pitocin per protocol          Anticipate svd

## 2017-10-03 NOTE — Lactation Note (Signed)
This note was copied from a baby's chart. 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  Pregnancy complicated by GDM, treated with metformin.  69 CBGs normal.  Mom has started giving baby formula by bottle 10-15 ml.  Mom states baby isn't latching well, but takes the bottle well.  Mom aware of assistance available with latching, but declined help at present.  Mom double pumping presently.   Lactation brochure left with Mom.  Reminded her of Lactation assistance available prn.   Consult Status Consult Status: Follow-up Date: 10/04/17 Follow-up type: In-patient    Crystal [name] 10/03/2017, 6:12 PM

## 2017-10-03 NOTE — Progress Notes (Signed)
Called to let MD know of pt arrival- orders given. Notified of SROM.

## 2017-10-03 NOTE — Anesthesia Procedure Notes (Signed)
Epidural Patient location during procedure: OB Start time: 10/03/2017 5:38 AM End time: 10/03/2017 5:43 AM  Staffing Anesthesiologist: Catalina Gravel Performed: anesthesiologist   Preanesthetic Checklist Completed: patient identified, pre-op evaluation, timeout performed, IV checked, risks and benefits discussed and monitors and equipment checked  Epidural Patient position: sitting Prep: DuraPrep Patient monitoring: blood pressure and continuous pulse ox Approach: midline Location: L3-L4 Injection technique: LOR air  Needle:  Needle type: Tuohy  Needle gauge: 17 G Needle length: 9 cm Needle insertion depth: 4 cm Catheter size: 19 Gauge Catheter at skin depth: 9 cm Test dose: negative and Other (1% Lidocaine)  Additional Notes Patient identified.  Risk benefits discussed including failed block, incomplete pain control, headache, nerve damage, paralysis, blood pressure changes, nausea, vomiting, reactions to medication both toxic or allergic, and postpartum back pain.  Patient expressed understanding and wished to proceed.  All questions were answered.  Sterile technique used throughout procedure and epidural site dressed with sterile barrier dressing. No paresthesia or other complications noted. The patient did not experience any signs of intravascular injection such as tinnitus or metallic taste in mouth nor signs of intrathecal spread such as rapid motor block. Please see nursing notes for vital signs. Reason for block:procedure for pain

## 2017-10-04 ENCOUNTER — Ambulatory Visit: Payer: Self-pay

## 2017-10-04 LAB — GLUCOSE, CAPILLARY: GLUCOSE-CAPILLARY: 105 mg/dL — AB (ref 65–99)

## 2017-10-04 LAB — BIRTH TISSUE RECOVERY COLLECTION (PLACENTA DONATION)

## 2017-10-04 MED ORDER — IBUPROFEN 600 MG PO TABS: 600.0000 mg | ORAL_TABLET | Freq: Four times a day (QID) | ORAL | 0 refills | Status: DC

## 2017-10-04 MED ORDER — OXYCODONE HCL 5 MG PO TABS: 5.0000 mg | ORAL_TABLET | ORAL | 0 refills | Status: DC | PRN

## 2017-10-04 MED FILL — oxyCODONE HCL 5 MG TABS: 5 | 2 days supply | Qty: 10 | Fill #0

## 2017-10-04 MED FILL — IBUPROFEN 600 MG TABLET: 600 | 7 days supply | Qty: 30 | Fill #0

## 2017-10-04 NOTE — Discharge Instructions (Signed)
As per discharge pamphlet °

## 2017-10-04 NOTE — Discharge Summary (Signed)
OB Discharge Summary     Patient Name: Crystal [name] DOB: December 07, 1981 MRN: [id number]  Date of admission: 10/03/2017 Delivering MD: Paula Compton   Date of discharge: 10/04/2017  Admitting diagnosis: INDUCTION Intrauterine pregnancy: [redacted]w[redacted]d     Secondary diagnosis:  Active Problems:   Gestational diabetes mellitus (GDM) controlled on oral hypoglycemic drug      Discharge diagnosis: Term Pregnancy Delivered and GDM A2                                                                                                Hospital course:  Onset of Labor With Vaginal Delivery     36 y.o. yo [id number] at [redacted]w[redacted]d was admitted in Active Labor on 10/03/2017. Patient had an uncomplicated labor course as follows:  Membrane Rupture Time/Date: 4:44 AM ,10/03/2017   Intrapartum Procedures: Episiotomy: None [1]                                         Lacerations:  Periurethral [8]  Patient had a delivery of a Viable infant. 10/03/2017  Information for the patient's newborn:  Kaysee, Hergert [[id number]]  Delivery Method: Vaginal, Spontaneous Delivery (Filed from Delivery Summary)    Pateint had an uncomplicated postpartum course.  She is ambulating, tolerating a regular diet, passing flatus, and urinating well. Patient is discharged home in stable condition on 10/04/17.   Physical exam  Vitals:   10/03/17 0850 10/03/17 0950 10/03/17 1415 10/04/17 0626  BP: 119/74 120/70 117/66 124/71  Pulse: 75 78 79 70  Resp: 18 18 16 16   Temp: 98.7 F (37.1 C) 98.5 F (36.9 C) 98.8 F (37.1 C) 97.8 F (36.6 C)  TempSrc: Oral Oral Oral Tympanic  Weight:       General: alert Lochia: appropriate Uterine Fundus: firm  Labs: Lab Results  Component Value Date   WBC 10.5 10/03/2017   HGB 11.6 (L) 10/03/2017   HCT 33.7 (L) 10/03/2017   MCV 92.8 10/03/2017   PLT 214 10/03/2017   CMP Latest Ref Rng & Units 03/25/2013  Glucose 70 - 99 mg/dL -  BUN 6 - 23 mg/dL -  Creatinine 0.4 - 1.2 mg/dL -  Sodium  135 - 145 meq/L -  Potassium 3.5 - 5.1 meq/L -  Chloride 96 - 112 meq/L -  CO2 19 - 32 meq/L -  Calcium 8.4 - 10.5 mg/dL -  Total Protein 6.0 - 8.3 g/dL 7.1  Total Bilirubin 0.3 - 1.2 mg/dL 0.7  Alkaline Phos 39 - 117 U/L 58  AST 0 - 37 U/L 32  ALT 0 - 35 U/L 48(H)    Discharge instruction: per After Visit Summary and "Baby and Me Booklet".  After visit meds:  Allergies as of 10/04/2017      Reactions   Dextromethorphan Hbr Rash      Medication List    STOP taking these medications   azithromycin 250 MG tablet Commonly known as:  ZITHROMAX Z-PAK   metFORMIN 500 MG tablet  Commonly known as:  GLUCOPHAGE   ranitidine 150 MG tablet Commonly known as:  ZANTAC     TAKE these medications   acetaminophen 500 MG tablet Commonly known as:  TYLENOL Take 1,000 mg by mouth every 6 (six) hours as needed for mild pain or headache.   albuterol 108 (90 Base) MCG/ACT inhaler Commonly known as:  PROVENTIL HFA;VENTOLIN HFA Inhale 1-2 puffs into the lungs every 6 (six) hours as needed for wheezing or shortness of breath.   ibuprofen 600 MG tablet Commonly known as:  ADVIL,MOTRIN Take 1 tablet (600 mg total) by mouth every 6 (six) hours.   MULTIVITAMIN PO Take 1 tablet by mouth daily.   oxyCODONE 5 MG immediate release tablet Commonly known as:  Oxy IR/ROXICODONE Take 1 tablet (5 mg total) by mouth every 4 (four) hours as needed for severe pain.   prenatal multivitamin Tabs tablet Take 1 tablet by mouth daily at 12 noon.       Diet: routine diet  Activity: Advance as tolerated. Pelvic rest for 6 weeks.   Outpatient follow up:3 weeks   Newborn Data: Live born female  Birth Weight: 6 lb 4.2 oz (2840 g) APGAR: 9, 10  Newborn Delivery   Birth date/time:  10/03/2017 07:07:00 Delivery type:  Vaginal, Spontaneous Delivery      Baby Feeding: Breast Disposition:home with mother   10/04/2017 Clarene Duke, MD

## 2017-10-04 NOTE — Progress Notes (Signed)
PPD #1 No problems, wants to go home Afeb, VSS Fundus firm, NT at U-1 All CBG normal Continue routine postpartum care, d/c home

## 2017-10-04 NOTE — Lactation Note (Addendum)
This note was copied from a baby's chart. Lactation Consultation Note  Patient Name: Crystal [name] ZXYOF'V Date: 10/04/2017 Reason for consult: Follow-up assessment;Other (Comment) (4th baby , mom ready for D/C engorgement prevention and tx reviewed ) 1st visit prior to this visit baby having pictures taken , mom aware LC plans to revisit her. Baby is 83 hours old, moms feeding preference is to breast and supplement which she has been doing.  Per mom has a DEBP at home.  Engorgement prevention and tx reviewed.  Mother informed of post-discharge support and given phone number to the lactation department, including services for phone call assistance; out-patient appointments; and breastfeeding support group. List of other breastfeeding resources in the community given in the handout. Encouraged mother to call for problems or concerns related to breastfeeding.   Maternal Data    Feeding    LATCH Score                   Interventions Interventions: Breast feeding basics reviewed  Lactation Tools Discussed/Used     Consult Status Consult Status: Complete Date: 10/04/17    Myer Haff 10/04/2017, 11:51 AM

## 2017-11-08 MED FILL — DICLOXACILLIN 500 MG CAP: 500 | 7 days supply | Qty: 21 | Fill #0

## 2017-11-09 MED FILL — BUPROPION HCL XL 150 MG TAB: 150 | 30 days supply | Qty: 30 | Fill #0

## 2017-12-15 MED FILL — buPROPion HCL ER (XL) 150 M: 150 | 30 days supply | Qty: 30 | Fill #1

## 2018-02-12 NOTE — Progress Notes (Signed)
[name] [place] [address] [place], [place] [address] Phone: [phone] Subjective:    I'm seeing this patient by the request  of:    CC: Hip and leg pain  [id number]  Crystal [name] is a 37 y.o. female coming in with complaint of hip and back pain.  Has been quite some time since we have seen patient.  Patient did have a delivery October 03, 2017. She is having bilateral hip pain when lying down. She feels a grinding when rolling over in bed. She feels weakness with hip flexion and when she stands up from a seated position. Denies any numbness tingling in legs.      Past Medical History:  Diagnosis Date  . Allergy   . Asthma    Past Surgical History:  Procedure Laterality Date  . HYSTEROSCOPY WITH NOVASURE N/A 05/23/2014   Procedure: Velvet Bathe;  Surgeon: Cheri Fowler, MD;  Location: [facility];  Service: Gynecology;  Laterality: N/A;  . SKIN CANCER EXCISION     Social History   Socioeconomic History  . Marital status: Married    Spouse name: None  . Number of children: None  . Years of education: None  . Highest education level: None  Social Needs  . Financial resource strain: None  . Food insecurity - worry: None  . Food insecurity - inability: None  . Transportation needs - medical: None  . Transportation needs - non-medical: None  Occupational History  . None  Tobacco Use  . Smoking status: Never Smoker  . Smokeless tobacco: Never Used  Substance and Sexual Activity  . Alcohol use: Yes  . Drug use: No  . Sexual activity: Yes  Other Topics Concern  . None  Social History Narrative   Regular exercise- yes   Allergies  Allergen Reactions  . Dextromethorphan Hbr Rash   Family History  Problem Relation Age of Onset  . Fibromyalgia Father   . Autoimmune disease Father   . Cancer Maternal Grandmother        stomach  . Diabetes Maternal Grandmother   . Diabetes Paternal Grandmother   . Heart disease Paternal Grandmother   .  Alcohol abuse Paternal Grandfather   . Cancer Paternal Grandfather        lung     Past medical history, social, surgical and family history all reviewed in electronic medical record.  No pertanent information unless stated regarding to the chief complaint.   Review of Systems:Review of systems updated and as accurate as of 02/13/18  No headache, visual changes, nausea, vomiting, diarrhea, constipation, dizziness, abdominal pain, skin rash, fevers, chills, night sweats, weight loss, swollen lymph nodes, body aches, joint swelling, muscle aches, chest pain, shortness of breath, mood changes.  Muscle aches.   Objective  Blood pressure 118/82, pulse 95, height 5\' 4"  (1.626 m), weight 172 lb (78 kg), SpO2 98 %, unknown if currently breastfeeding. Systems examined below as of 02/13/18   General: No apparent distress alert and oriented x3 mood and affect normal, dressed appropriately.  HEENT: Pupils equal, extraocular movements intact  Respiratory: Patient's speak in full sentences and does not appear short of breath  Cardiovascular: No lower extremity edema, non tender, no erythema  Skin: Warm dry intact with no signs of infection or rash on extremities or on axial skeleton.  Abdomen: Soft nontender  Neuro: Cranial nerves II through XII are intact, neurovascularly intact in all extremities with 2+ DTRs and 2+ pulses.  Lymph: No  lymphadenopathy of posterior or anterior cervical chain or axillae bilaterally.  Gait normal with good balance and coordination.  MSK:  Non tender with full range of motion and good stability and symmetric strength and tone of shoulders, elbows, wrist, hip, knee and ankles bilaterally.    Back - Normal skin, Spine with normal alignment and no deformity.  No tenderness to vertebral process palpation.  Paraspinous muscles are tender without spasm.   Range of motion is full at neck and mild limitation in the lumbar sacral regions Pain over the pubic symphyses noted.    Osteopathic findings C2 flexed rotated and side bent right C4 flexed rotated and side bent left C6 flexed rotated and side bent left T3 extended rotated and side bent right inhaled third rib T9 extended rotated and side bent left L2 flexed rotated and side bent right Sacrum right on right Pelvic shear noted.      Impression and Recommendations:     This case required medical decision making of moderate complexity.      Note: This dictation was prepared with Dragon dictation along with smaller phrase technology. Any transcriptional errors that result from this process are unintentional.

## 2018-02-13 ENCOUNTER — Encounter: Payer: Self-pay | Admitting: Family Medicine

## 2018-02-13 ENCOUNTER — Ambulatory Visit: Payer: 59 | Admitting: Family Medicine

## 2018-02-13 VITALS — BP 118/82 | HR 95 | Ht 64.0 in | Wt 172.0 lb

## 2018-02-13 DIAGNOSIS — M869 Osteomyelitis, unspecified: Secondary | ICD-10-CM | POA: Diagnosis not present

## 2018-02-13 DIAGNOSIS — M999 Biomechanical lesion, unspecified: Secondary | ICD-10-CM | POA: Diagnosis not present

## 2018-02-13 MED ORDER — PREDNISONE 20 MG PO TABS: 40.0000 mg | ORAL_TABLET | Freq: Every day | ORAL | 0 refills | Status: DC

## 2018-02-13 MED FILL — predniSONE 20 MG TABS: 20 | 5 days supply | Qty: 10 | Fill #0

## 2018-02-13 NOTE — Assessment & Plan Note (Signed)
Decision today to treat with OMT was based on Physical Exam  After verbal consent patient was treated with ME techniques in cervical, thoracic, pelvis, sacral and lumbar areas  Patient tolerated the procedure well with improvement in symptoms  Patient given exercises, stretches and lifestyle modifications  See medications in patient instructions if given  Patient will follow up in 3-4 weeks

## 2018-02-13 NOTE — Assessment & Plan Note (Signed)
Prednisone given, discuss breast feeding, 5 day course, discussed HEP and may need PT for pelvic floor.  Ice  RTC in 4 weeks

## 2018-02-13 NOTE — Patient Instructions (Addendum)
Good to see you  I am sorry you are hurting Prednisone daily for 5 days  Ice is your friend.  Maybe pump and dump during the prednisone  Vitamin D 4000-5000 IU daily over the counter See me again in 3 weeks

## 2018-03-07 ENCOUNTER — Ambulatory Visit: Payer: 59 | Admitting: Family Medicine

## 2018-03-13 ENCOUNTER — Ambulatory Visit: Payer: 59 | Admitting: Family Medicine

## 2018-03-13 ENCOUNTER — Encounter: Payer: Self-pay | Admitting: Family Medicine

## 2018-03-13 VITALS — BP 122/78 | HR 109 | Ht 64.0 in

## 2018-03-13 DIAGNOSIS — M999 Biomechanical lesion, unspecified: Secondary | ICD-10-CM | POA: Diagnosis not present

## 2018-03-13 DIAGNOSIS — M533 Sacrococcygeal disorders, not elsewhere classified: Secondary | ICD-10-CM

## 2018-03-13 NOTE — Assessment & Plan Note (Signed)
Decision today to treat with OMT was based on Physical Exam  After verbal consent patient was treated with HVLA, ME, FPR techniques in cervical, thoracic, lumbar and sacral areas  Patient tolerated the procedure well with improvement in symptoms  Patient given exercises, stretches and lifestyle modifications  See medications in patient instructions if given  Patient will follow up in 4 weeks 

## 2018-03-13 NOTE — Assessment & Plan Note (Signed)
Responded well to osteopathic manipulation.  Discussed icing regimen and home exercises.  Discussed posture and ergonomics.  Discussed possible Keagle exercises.  Patient declined any physical therapy for pelvic floor at this point.  Follow-up again in 4 weeks

## 2018-03-13 NOTE — Patient Instructions (Signed)
Good to see you  Ice still can help  Lets keep watching it  [phone] See me again in 2-3 weeks

## 2018-03-13 NOTE — Progress Notes (Signed)
[name] [place], [place] [address] Phone: [phone] Subjective:     CC: Back pain  OZD:[id number]  Crystal [name] is a 37 y.o. female coming in with complaint of back pain. No change since last visit. She has been having pain in the both legs with left greater than right.  Seems to start more in the pelvic region.  Patient still has the discomfort.  Was going to do prednisone but never did.  Patient states that she decided not to go to the possibility of having difficulty with breast-feeding.  Patient is not taking any anti-inflammatories at this time.  Did take Tylenol with no significant improvement.    Past Medical History:  Diagnosis Date  . Allergy   . Asthma    Past Surgical History:  Procedure Laterality Date  . HYSTEROSCOPY WITH NOVASURE N/A 05/23/2014   Procedure: Velvet Bathe;  Surgeon: Cheri Fowler, MD;  Location: [facility];  Service: Gynecology;  Laterality: N/A;  . SKIN CANCER EXCISION     Social History   Socioeconomic History  . Marital status: Married    Spouse name: None  . Number of children: None  . Years of education: None  . Highest education level: None  Social Needs  . Financial resource strain: None  . Food insecurity - worry: None  . Food insecurity - inability: None  . Transportation needs - medical: None  . Transportation needs - non-medical: None  Occupational History  . None  Tobacco Use  . Smoking status: Never Smoker  . Smokeless tobacco: Never Used  Substance and Sexual Activity  . Alcohol use: Yes  . Drug use: No  . Sexual activity: Yes  Other Topics Concern  . None  Social History Narrative   Regular exercise- yes   Allergies  Allergen Reactions  . Dextromethorphan Hbr Rash   Family History  Problem Relation Age of Onset  . Fibromyalgia Father   . Autoimmune disease Father   . Cancer Maternal Grandmother        stomach  . Diabetes Maternal Grandmother   . Diabetes Paternal  Grandmother   . Heart disease Paternal Grandmother   . Alcohol abuse Paternal Grandfather   . Cancer Paternal Grandfather        lung     Past medical history, social, surgical and family history all reviewed in electronic medical record.  No pertanent information unless stated regarding to the chief complaint.   Review of Systems:Review of systems updated and as accurate as of 03/13/18  No headache, visual changes, nausea, vomiting, diarrhea, constipation, dizziness, abdominal pain, skin rash, fevers, chills, night sweats, weight loss, swollen lymph nodes, body aches, joint swelling, chest pain, shortness of breath, mood changes.  Positive muscle aches  Objective  Blood pressure 122/78, pulse (!) 109, height 5\' 4"  (1.626 m), SpO2 98 %, unknown if currently breastfeeding. Systems examined below as of 03/13/18   General: No apparent distress alert and oriented x3 mood and affect normal, dressed appropriately.  HEENT: Pupils equal, extraocular movements intact  Respiratory: Patient's speak in full sentences and does not appear short of breath  Cardiovascular: No lower extremity edema, non tender, no erythema  Skin: Warm dry intact with no signs of infection or rash on extremities or on axial skeleton.  Abdomen: Soft nontender  Neuro: Cranial nerves II through XII are intact, neurovascularly intact in all extremities with 2+ DTRs and 2+ pulses.  Lymph: No lymphadenopathy of posterior or  anterior cervical chain or axillae bilaterally.  Gait normal with good balance and coordination.  MSK:  Non tender with full range of motion and good stability and symmetric strength and tone of shoulders, elbows, wrist, hip, knee and ankles bilaterally.  Back Exam:  Inspection: Unremarkable  Motion: Flexion 45 deg, Extension 30 deg, Side Bending to 45 deg bilaterally,  Rotation to 45 deg bilaterally  SLR laying: Negative  XSLR laying: Negative  Palpable tenderness: Tenderness to palpation in the  paraspinal musculature lumbar spine right greater than left..  Tenderness over the pubic bone itself. FABER: negative. Sensory change: Gross sensation intact to all lumbar and sacral dermatomes.  Reflexes: 2+ at both patellar tendons, 2+ at achilles tendons, Babinski's downgoing.  Strength at foot  Plantar-flexion: 5/5 Dorsi-flexion: 5/5 Eversion: 5/5 Inversion: 5/5  Leg strength  Quad: 5/5 Hamstring: 5/5 Hip flexor: 5/5 Hip abductors: 5/5  Gait unremarkable.  Osteopathic findings  C3 flexed rotated and side bent right T3 extended rotated and side bent right inhaled third rib T6 extended rotated and side bent left with inhaled rib L2 flexed rotated and side bent right Sacrum right on right Pelvic shear noted    Impression and Recommendations:     This case required medical decision making of moderate complexity.      Note: This dictation was prepared with Dragon dictation along with smaller phrase technology. Any transcriptional errors that result from this process are unintentional.

## 2018-04-01 NOTE — Progress Notes (Deleted)
[name] [address] [place], [place] [address] Phone: [phone] Subjective:    I'm seeing this patient by the request  of:    CC:   [id number]  Crystal [name] is a 37 y.o. female coming in with complaint of ***  Onset-  Location Duration-  Character- Aggravating factors- Reliving factors-  Therapies tried-  Severity-     Past Medical History:  Diagnosis Date  . Allergy   . Asthma    Past Surgical History:  Procedure Laterality Date  . HYSTEROSCOPY WITH NOVASURE N/A 05/23/2014   Procedure: Velvet Bathe;  Surgeon: Cheri Fowler, MD;  Location: [facility];  Service: Gynecology;  Laterality: N/A;  . SKIN CANCER EXCISION     Social History   Socioeconomic History  . Marital status: Married    Spouse name: Not on file  . Number of children: Not on file  . Years of education: Not on file  . Highest education level: Not on file  Occupational History  . Not on file  Social Needs  . Financial resource strain: Not on file  . Food insecurity:    Worry: Not on file    Inability: Not on file  . Transportation needs:    Medical: Not on file    Non-medical: Not on file  Tobacco Use  . Smoking status: Never Smoker  . Smokeless tobacco: Never Used  Substance and Sexual Activity  . Alcohol use: Yes  . Drug use: No  . Sexual activity: Yes  Lifestyle  . Physical activity:    Days per week: Not on file    Minutes per session: Not on file  . Stress: Not on file  Relationships  . Social connections:    Talks on phone: Not on file    Gets together: Not on file    Attends religious service: Not on file    Active member of club or organization: Not on file    Attends meetings of clubs or organizations: Not on file    Relationship status: Not on file  Other Topics Concern  . Not on file  Social History Narrative   Regular exercise- yes   Allergies  Allergen Reactions  . Dextromethorphan Hbr Rash   Family History  Problem  Relation Age of Onset  . Fibromyalgia Father   . Autoimmune disease Father   . Cancer Maternal Grandmother        stomach  . Diabetes Maternal Grandmother   . Diabetes Paternal Grandmother   . Heart disease Paternal Grandmother   . Alcohol abuse Paternal Grandfather   . Cancer Paternal Grandfather        lung     Past medical history, social, surgical and family history all reviewed in electronic medical record.  No pertanent information unless stated regarding to the chief complaint.   Review of Systems:Review of systems updated and as accurate as of 04/01/18  No headache, visual changes, nausea, vomiting, diarrhea, constipation, dizziness, abdominal pain, skin rash, fevers, chills, night sweats, weight loss, swollen lymph nodes, body aches, joint swelling, muscle aches, chest pain, shortness of breath, mood changes.   Objective  unknown if currently breastfeeding. Systems examined below as of 04/01/18   General: No apparent distress alert and oriented x3 mood and affect normal, dressed appropriately.  HEENT: Pupils equal, extraocular movements intact  Respiratory: Patient's speak in full sentences and does not appear short of breath  Cardiovascular: No lower extremity edema, non tender, no erythema  Skin: Warm dry intact with no signs of infection or rash on extremities or on axial skeleton.  Abdomen: Soft nontender  Neuro: Cranial nerves II through XII are intact, neurovascularly intact in all extremities with 2+ DTRs and 2+ pulses.  Lymph: No lymphadenopathy of posterior or anterior cervical chain or axillae bilaterally.  Gait normal with good balance and coordination.  MSK:  Non tender with full range of motion and good stability and symmetric strength and tone of shoulders, elbows, wrist, hip, knee and ankles bilaterally.     Impression and Recommendations:     This case required medical decision making of moderate complexity.      Note: This dictation was prepared  with Dragon dictation along with smaller phrase technology. Any transcriptional errors that result from this process are unintentional.

## 2018-04-02 ENCOUNTER — Ambulatory Visit: Payer: 59 | Admitting: Family Medicine

## 2018-04-16 MED FILL — buPROPion HCL ER (XL) 150 M: 150 | 30 days supply | Qty: 30 | Fill #2

## 2018-05-04 MED FILL — DEXTROAMP-AMPHETAMINE 5 MG: 5 | 15 days supply | Qty: 30 | Fill #0

## 2018-05-04 MED FILL — DEXTROAMP-AMPHETAMIN 15 MG: 15 | 30 days supply | Qty: 30 | Fill #0

## 2018-06-15 MED FILL — DEXTROAMP-AMPHETAMINE 5 MG: 5 | 15 days supply | Qty: 30 | Fill #0

## 2018-06-15 MED FILL — DEXTROAMP-AMPHETAMIN 15 MG: 15 | 30 days supply | Qty: 30 | Fill #0

## 2018-07-17 MED FILL — DEXTROAMP-AMPHETAMINE 5 MG: 5 | 15 days supply | Qty: 30 | Fill #0

## 2018-07-17 MED FILL — DEXTROAMP-AMPHETAMIN 15 MG: 15 | 30 days supply | Qty: 30 | Fill #0

## 2018-08-29 MED FILL — AMPHETAMINE-DEXTROAMPHETAMI: 15 | 30 days supply | Qty: 30 | Fill #0

## 2018-08-29 MED FILL — AMPHETAMINE-DEXTROAMPHETAMI: 5 | 15 days supply | Qty: 30 | Fill #0

## 2018-10-02 MED FILL — AMPHETAMINE-DEXTROAMPHETAMI: 15 | 30 days supply | Qty: 30 | Fill #0

## 2018-10-02 MED FILL — AMPHETAMINE-DEXTROAMPHETAMI: 5 | 15 days supply | Qty: 30 | Fill #0

## 2018-11-14 MED FILL — DEXTROAMP-AMPHETAMINE 5 MG: 5 | 15 days supply | Qty: 30 | Fill #0

## 2018-11-14 MED FILL — AMPHETAMINE-DEXTROAMPHETAMI: 15 | 30 days supply | Qty: 30 | Fill #0

## 2018-12-13 NOTE — Progress Notes (Signed)
[name] [address] [place], [place] [address] Phone: [phone] Subjective:   [name], am serving as a scribe for Dr. Hulan Saas.  CC: Right hip pain, back pain, knee pain  [id number]  Crystal [name] is a 37 y.o. female coming in with complaint of right hip and knee pain. She feels like she has had decreased ROM with knee flexion. Has tried stretching which helped. Uses foam roller as well.  Patient has been walking a lot more.  Attempting to lose weight.  Patient notices more tightness in the back as well.    Past Medical History:  Diagnosis Date  . Allergy   . Asthma    Past Surgical History:  Procedure Laterality Date  . HYSTEROSCOPY WITH NOVASURE N/A 05/23/2014   Procedure: Velvet Bathe;  Surgeon: Cheri Fowler, MD;  Location: [facility];  Service: Gynecology;  Laterality: N/A;  . SKIN CANCER EXCISION     Social History   Socioeconomic History  . Marital status: Married    Spouse name: Not on file  . Number of children: Not on file  . Years of education: Not on file  . Highest education level: Not on file  Occupational History  . Not on file  Social Needs  . Financial resource strain: Not on file  . Food insecurity:    Worry: Not on file    Inability: Not on file  . Transportation needs:    Medical: Not on file    Non-medical: Not on file  Tobacco Use  . Smoking status: Never Smoker  . Smokeless tobacco: Never Used  Substance and Sexual Activity  . Alcohol use: Yes  . Drug use: No  . Sexual activity: Yes  Lifestyle  . Physical activity:    Days per week: Not on file    Minutes per session: Not on file  . Stress: Not on file  Relationships  . Social connections:    Talks on phone: Not on file    Gets together: Not on file    Attends religious service: Not on file    Active member of club or organization: Not on file    Attends meetings of clubs or organizations: Not on file    Relationship status: Not on  file  Other Topics Concern  . Not on file  Social History Narrative   Regular exercise- yes   Allergies  Allergen Reactions  . Dextromethorphan Hbr Rash   Family History  Problem Relation Age of Onset  . Fibromyalgia Father   . Autoimmune disease Father   . Cancer Maternal Grandmother        stomach  . Diabetes Maternal Grandmother   . Diabetes Paternal Grandmother   . Heart disease Paternal Grandmother   . Alcohol abuse Paternal Grandfather   . Cancer Paternal Grandfather        lung      Current Outpatient Medications (Respiratory):  .  albuterol (PROVENTIL HFA;VENTOLIN HFA) 108 (90 Base) MCG/ACT inhaler, Inhale 1-2 puffs into the lungs every 6 (six) hours as needed for wheezing or shortness of breath.    Current Outpatient Medications (Other):  Marland Kitchen  Prenatal Vit-Fe Fumarate-FA (PRENATAL MULTIVITAMIN) TABS tablet, Take 1 tablet by mouth daily at 12 noon.    Past medical history, social, surgical and family history all reviewed in electronic medical record.  No pertanent information unless stated regarding to the chief complaint.   Review of Systems:  No headache, visual changes,  nausea, vomiting, diarrhea, constipation, dizziness, abdominal pain, skin rash, fevers, chills, night sweats, weight loss, swollen lymph nodes, body aches, joint swelling, , chest pain, shortness of breath, mood changes.  Positive muscle aches  Objective  Blood pressure 118/84, pulse (!) 101, height 5\' 4"  (1.626 m), weight 170 lb (77.1 kg), SpO2 99 %, unknown if currently breastfeeding.    General: No apparent distress alert and oriented x3 mood and affect normal, dressed appropriately.  HEENT: Pupils equal, extraocular movements intact  Respiratory: Patient's speak in full sentences and does not appear short of breath  Cardiovascular: No lower extremity edema, non tender, no erythema  Skin: Warm dry intact with no signs of infection or rash on extremities or on axial skeleton.  Abdomen: Soft  nontender  Neuro: Cranial nerves II through XII are intact, neurovascularly intact in all extremities with 2+ DTRs and 2+ pulses.  Lymph: No lymphadenopathy of posterior or anterior cervical chain or axillae bilaterally.  Gait normal with good balance and coordination.  MSK:  Non tender with full range of motion and good stability and symmetric strength and tone of shoulders, elbows, wrist, hip, knee and ankles bilaterally.  Back Exam:  Inspection: Loss of lordosis Motion: Flexion 35 deg, Extension 20 deg, Side Bending to 35 deg bilaterally,  Rotation to 40 deg bilaterally  SLR laying: Negative  XSLR laying: Negative  Palpable tenderness: Tender to palpation the paraspinal musculature lumbar spine right greater than left.  More around L4-S1. FABER: Positive Faber on the right. Sensory change: Gross sensation intact to all lumbar and sacral dermatomes.  Reflexes: 2+ at both patellar tendons, 2+ at achilles tendons, Babinski's downgoing.  Strength at foot  Plantar-flexion: 5/5 Dorsi-flexion: 5/5 Eversion: 5/5 Inversion: 5/5  Leg strength  Quad: 5/5 Hamstring: 5/5 Hip flexor: 5/5 Hip abductors: 4/5 but symmetric  Osteopathic findings  C4 flexed rotated and side bent left C7 flexed rotated and side bent right T3 extended rotated and side bent right inhaled third rib T7 extended rotated and side bent left L2 flexed rotated and side bent right Sacrum right on right    Impression and Recommendations:     This case required medical decision making of moderate complexity. The above documentation has been reviewed and is accurate and complete Lyndal Pulley, DO       Note: This dictation was prepared with Dragon dictation along with smaller phrase technology. Any transcriptional errors that result from this process are unintentional.

## 2018-12-14 ENCOUNTER — Encounter: Payer: Self-pay | Admitting: Family Medicine

## 2018-12-14 ENCOUNTER — Ambulatory Visit: Payer: 59 | Admitting: Family Medicine

## 2018-12-14 VITALS — BP 118/84 | HR 101 | Ht 64.0 in | Wt 170.0 lb

## 2018-12-14 DIAGNOSIS — M62830 Muscle spasm of back: Secondary | ICD-10-CM | POA: Diagnosis not present

## 2018-12-14 DIAGNOSIS — M999 Biomechanical lesion, unspecified: Secondary | ICD-10-CM

## 2018-12-14 NOTE — Assessment & Plan Note (Signed)
More likely back spasms.  Has had work-up previously that was fairly unremarkable.  Patient is having more tightness.  Trying to work out.  Likely more of the hip flexor spasms.  Discussed with patient about increasing range of motion.  Patient will follow-up with me again in 8 weeks

## 2018-12-14 NOTE — Patient Instructions (Signed)
Good to see you  Ice is your friend Tru-pull lite knee brace with working out  Calcium pyruvate 1500mg  daily  See me again in 4-8 weeks Happy holidays!

## 2018-12-14 NOTE — Assessment & Plan Note (Signed)
Decision today to treat with OMT was based on Physical Exam  After verbal consent patient was treated with HVLA, ME, FPR techniques in cervical, thoracic, lumbar and sacral areas  Patient tolerated the procedure well with improvement in symptoms  Patient given exercises, stretches and lifestyle modifications  See medications in patient instructions if given  Patient will follow up in 8 weeks 

## 2018-12-28 MED FILL — AMPHETAMINE-DEXTROAMPHETAMI: 15 | 30 days supply | Qty: 30 | Fill #0

## 2018-12-28 MED FILL — DEXTROAMP-AMPHETAMINE 5 MG: 5 | 15 days supply | Qty: 30 | Fill #0

## 2018-12-31 DIAGNOSIS — Z124 Encounter for screening for malignant neoplasm of cervix: Secondary | ICD-10-CM | POA: Diagnosis not present

## 2018-12-31 DIAGNOSIS — Z01419 Encounter for gynecological examination (general) (routine) without abnormal findings: Secondary | ICD-10-CM | POA: Diagnosis not present

## 2019-01-15 NOTE — Progress Notes (Signed)
[name] [address] [place], [place] [address] Phone: [phone] Subjective:    I Crystal [name] am serving as a Education administrator for Dr. Hulan Saas.     CC: Back pain follow-up  [id number]  Crystal [name] is a 38 y.o. female coming in with complaint of back pain. States that she is doing well.  Continues have some mild discomfort from time to time.  Finds it very difficult to do any weight loss.  Patient is trying to work out and feels that is having significant trouble losing any weight since her pregnancy.  States that that seems to give her some mild depression.  Wants to avoid any type of Effexor or antidepressant medications well.  Denies any suicidal or homicidal ideation.     Past Medical History:  Diagnosis Date  . Allergy   . Asthma    Past Surgical History:  Procedure Laterality Date  . HYSTEROSCOPY WITH NOVASURE N/A 05/23/2014   Procedure: Velvet Bathe;  Surgeon: Cheri Fowler, MD;  Location: [facility];  Service: Gynecology;  Laterality: N/A;  . SKIN CANCER EXCISION     Social History   Socioeconomic History  . Marital status: Married    Spouse name: Not on file  . Number of children: Not on file  . Years of education: Not on file  . Highest education level: Not on file  Occupational History  . Not on file  Social Needs  . Financial resource strain: Not on file  . Food insecurity:    Worry: Not on file    Inability: Not on file  . Transportation needs:    Medical: Not on file    Non-medical: Not on file  Tobacco Use  . Smoking status: Never Smoker  . Smokeless tobacco: Never Used  Substance and Sexual Activity  . Alcohol use: Yes  . Drug use: No  . Sexual activity: Yes  Lifestyle  . Physical activity:    Days per week: Not on file    Minutes per session: Not on file  . Stress: Not on file  Relationships  . Social connections:    Talks on phone: Not on file    Gets together: Not on file    Attends religious service:  Not on file    Active member of club or organization: Not on file    Attends meetings of clubs or organizations: Not on file    Relationship status: Not on file  Other Topics Concern  . Not on file  Social History Narrative   Regular exercise- yes   Allergies  Allergen Reactions  . Dextromethorphan Hbr Rash   Family History  Problem Relation Age of Onset  . Fibromyalgia Father   . Autoimmune disease Father   . Cancer Maternal Grandmother        stomach  . Diabetes Maternal Grandmother   . Diabetes Paternal Grandmother   . Heart disease Paternal Grandmother   . Alcohol abuse Paternal Grandfather   . Cancer Paternal Grandfather        lung      Current Outpatient Medications (Respiratory):  .  albuterol (PROVENTIL HFA;VENTOLIN HFA) 108 (90 Base) MCG/ACT inhaler, Inhale 1-2 puffs into the lungs every 6 (six) hours as needed for wheezing or shortness of breath.    Current Outpatient Medications (Other):  .  amphetamine-dextroamphetamine (ADDERALL) 15 MG tablet, Take 15 mg by mouth daily. .  Prenatal Vit-Fe Fumarate-FA (PRENATAL MULTIVITAMIN) TABS tablet, Take 1 tablet by  mouth daily at 12 noon. .  Vitamin D, Ergocalciferol, (DRISDOL) 1.25 MG (50000 UT) CAPS capsule, Take 1 capsule (50,000 Units total) by mouth every 7 (seven) days.    Past medical history, social, surgical and family history all reviewed in electronic medical record.  No pertanent information unless stated regarding to the chief complaint.   Review of Systems:  No headache, visual changes, nausea, vomiting, diarrhea, constipation, dizziness, abdominal pain, skin rash, fevers, chills, night sweats, weight loss, swollen lymph nodes, body aches, joint swelling, muscle aches, chest pain, shortness of breath, mood changes.   Objective  Blood pressure 120/82, pulse (!) 104, height 5\' 4"  (1.626 m), weight 169 lb (76.7 kg), SpO2 98 %, unknown if currently breastfeeding.    General: No apparent distress alert and  oriented x3 mood and affect normal, dressed appropriately.  HEENT: Pupils equal, extraocular movements intact fullness of thyroid noted Respiratory: Patient's speak in full sentences and does not appear short of breath  Cardiovascular: No lower extremity edema, non tender, no erythema  Skin: Warm dry intact with no signs of infection or rash on extremities or on axial skeleton.  Abdomen: Soft nontender  Neuro: Cranial nerves II through XII are intact, neurovascularly intact in all extremities with 2+ DTRs and 2+ pulses.  Lymph: No lymphadenopathy of posterior or anterior cervical chain or axillae bilaterally.  Gait normal with good balance and coordination.  MSK:  Non tender with full range of motion and good stability and symmetric strength and tone of shoulders, elbows, wrist, hip, knee and ankles bilaterally.  Back Exam:  Inspection: Mild loss of lordosis.  Poor core strength Motion: Flexion 45 deg, Extension 25 deg, Side Bending to 35 deg bilaterally,  Rotation to 35 deg bilaterally  SLR laying: Negative  XSLR laying: Negative  Palpable tenderness: Tender to palpation the paraspinal musculature lumbar spine right greater than left. FABER: negative. Sensory change: Gross sensation intact to all lumbar and sacral dermatomes.  Reflexes: 2+ at both patellar tendons, 2+ at achilles tendons, Babinski's downgoing.  Strength at foot  Plantar-flexion: 5/5 Dorsi-flexion: 5/5 Eversion: 5/5 Inversion: 5/5  Leg strength  Quad: 5/5 Hamstring: 5/5 Hip flexor: 5/5 Hip abductors: 4/5 but symmetric   Osteopathic findings  T7 extended rotated and side bent left L2 flexed rotated and side bent left  Sacrum right on right    Impression and Recommendations:     This case required medical decision making of moderate complexity. The above documentation has been reviewed and is accurate and complete Lyndal Pulley, DO       Note: This dictation was prepared with Dragon dictation along with  smaller phrase technology. Any transcriptional errors that result from this process are unintentional.

## 2019-01-16 ENCOUNTER — Encounter: Payer: Self-pay | Admitting: Family Medicine

## 2019-01-16 ENCOUNTER — Ambulatory Visit: Payer: 59 | Admitting: Family Medicine

## 2019-01-16 ENCOUNTER — Other Ambulatory Visit (INDEPENDENT_AMBULATORY_CARE_PROVIDER_SITE_OTHER): Payer: 59

## 2019-01-16 VITALS — BP 120/82 | HR 104 | Ht 64.0 in | Wt 169.0 lb

## 2019-01-16 DIAGNOSIS — R635 Abnormal weight gain: Secondary | ICD-10-CM

## 2019-01-16 DIAGNOSIS — M2550 Pain in unspecified joint: Secondary | ICD-10-CM

## 2019-01-16 DIAGNOSIS — M999 Biomechanical lesion, unspecified: Secondary | ICD-10-CM

## 2019-01-16 DIAGNOSIS — M533 Sacrococcygeal disorders, not elsewhere classified: Secondary | ICD-10-CM

## 2019-01-16 LAB — URIC ACID: URIC ACID, SERUM: 3.5 mg/dL (ref 2.4–7.0)

## 2019-01-16 LAB — CBC WITH DIFFERENTIAL/PLATELET
BASOS ABS: 0.1 10*3/uL (ref 0.0–0.1)
Basophils Relative: 1.2 % (ref 0.0–3.0)
EOS ABS: 0.1 10*3/uL (ref 0.0–0.7)
Eosinophils Relative: 1.7 % (ref 0.0–5.0)
HEMATOCRIT: 39.9 % (ref 36.0–46.0)
Hemoglobin: 13.7 g/dL (ref 12.0–15.0)
LYMPHS PCT: 35.4 % (ref 12.0–46.0)
Lymphs Abs: 2.3 10*3/uL (ref 0.7–4.0)
MCHC: 34.3 g/dL (ref 30.0–36.0)
MCV: 92.9 fl (ref 78.0–100.0)
MONOS PCT: 5.6 % (ref 3.0–12.0)
Monocytes Absolute: 0.4 10*3/uL (ref 0.1–1.0)
NEUTROS PCT: 56.1 % (ref 43.0–77.0)
Neutro Abs: 3.6 10*3/uL (ref 1.4–7.7)
Platelets: 254 10*3/uL (ref 150.0–400.0)
RBC: 4.3 Mil/uL (ref 3.87–5.11)
RDW: 12.6 % (ref 11.5–15.5)
WBC: 6.4 10*3/uL (ref 4.0–10.5)

## 2019-01-16 LAB — COMPREHENSIVE METABOLIC PANEL
ALK PHOS: 55 U/L (ref 39–117)
ALT: 19 U/L (ref 0–35)
AST: 17 U/L (ref 0–37)
Albumin: 4.6 g/dL (ref 3.5–5.2)
BILIRUBIN TOTAL: 0.6 mg/dL (ref 0.2–1.2)
BUN: 7 mg/dL (ref 6–23)
CO2: 25 mEq/L (ref 19–32)
Calcium: 9.9 mg/dL (ref 8.4–10.5)
Chloride: 104 mEq/L (ref 96–112)
Creatinine, Ser: 0.76 mg/dL (ref 0.40–1.20)
GFR: 85.12 mL/min (ref >60.00)
GLUCOSE: 105 mg/dL — AB (ref 70–99)
Potassium: 3.8 mEq/L (ref 3.5–5.1)
Sodium: 137 mEq/L (ref 135–145)
TOTAL PROTEIN: 7.7 g/dL (ref 6.0–8.3)

## 2019-01-16 LAB — TSH: TSH: 0.93 u[IU]/mL (ref 0.35–4.50)

## 2019-01-16 LAB — IBC PANEL
Iron: 163 ug/dL — ABNORMAL HIGH (ref 42–145)
Saturation Ratios: 39.9 % (ref 20.0–50.0)
TRANSFERRIN: 292 mg/dL (ref 212.0–360.0)

## 2019-01-16 LAB — T4, FREE: FREE T4: 0.75 ng/dL (ref 0.60–1.60)

## 2019-01-16 LAB — SEDIMENTATION RATE: SED RATE: 14 mm/h (ref 0–20)

## 2019-01-16 LAB — C-REACTIVE PROTEIN: CRP: 0.2 mg/dL — ABNORMAL LOW (ref 0.5–20.0)

## 2019-01-16 LAB — T3, FREE: T3 FREE: 3.4 pg/mL (ref 2.3–4.2)

## 2019-01-16 MED ORDER — VITAMIN D (ERGOCALCIFEROL) 1.25 MG (50000 UNIT) PO CAPS: 50000.0000 [IU] | ORAL_CAPSULE | ORAL | 0 refills | Status: DC

## 2019-01-16 MED FILL — VIT D2 1.25 MG (50,000 UNIT: 1.25 MG | 28 days supply | Qty: 4 | Fill #0

## 2019-01-16 NOTE — Assessment & Plan Note (Signed)
Decision today to treat with OMT was based on Physical Exam  After verbal consent patient was treated with HVLA, ME, FPR techniques in  thoracic, lumbar and sacral areas  Patient tolerated the procedure well with improvement in symptoms  Patient given exercises, stretches and lifestyle modifications  See medications in patient instructions if given  Patient will follow up in 4 weeks 

## 2019-01-16 NOTE — Patient Instructions (Signed)
Good to see you  Continue the calcium pyruvate  Once weekly vitamin D  We will try the weight management and see if they will take you  Labs downstairs  See me again in 4 weeks  We will get you perfect!

## 2019-01-16 NOTE — Assessment & Plan Note (Signed)
I believe the patient is having more exacerbation secondary to the sacroiliac joint dysfunction again.  Patient is having some difficulty with her weight gain as well and will refer patient to healthy weight and wellness.  Laboratory work-up also ordered to rule out any other things that can be contributing to patient's increased pain as well as weight.  Follow-up with me again in 4 to 6 weeks

## 2019-01-19 LAB — PTH, INTACT AND CALCIUM
CALCIUM: 9.9 mg/dL (ref 8.6–10.2)
PTH: 15 pg/mL (ref 14–64)

## 2019-01-19 LAB — VITAMIN D 1,25 DIHYDROXY
Vitamin D 1, 25 (OH)2 Total: 61 pg/mL (ref 18–72)
Vitamin D2 1, 25 (OH)2: <8 pg/mL
Vitamin D3 1, 25 (OH)2: 61 pg/mL

## 2019-01-19 LAB — CALCIUM, IONIZED: Calcium, Ion: 5.37 mg/dL (ref 4.8–5.6)

## 2019-01-19 LAB — RHEUMATOID FACTOR: Rhuematoid fact SerPl-aCnc: <14 IU/mL (ref <14)

## 2019-01-19 LAB — CYCLIC CITRUL PEPTIDE ANTIBODY, IGG

## 2019-01-19 LAB — ANA: Anti Nuclear Antibody(ANA): NEGATIVE

## 2019-01-19 LAB — ANGIOTENSIN CONVERTING ENZYME: Angiotensin-Converting Enzyme: 40 U/L (ref 9–67)

## 2019-02-08 MED FILL — AMPHETAMINE-DEXTROAMPHETAMI: 5 | 15 days supply | Qty: 30 | Fill #0

## 2019-02-08 MED FILL — AMPHETAMINE-DEXTROAMPHETAMI: 15 | 30 days supply | Qty: 30 | Fill #0

## 2019-02-08 MED FILL — VIT D2 1.25 MG (50,000 UNIT: 1.25 MG | 28 days supply | Qty: 4 | Fill #1

## 2019-02-13 ENCOUNTER — Ambulatory Visit: Payer: 59 | Admitting: Family Medicine

## 2019-02-13 ENCOUNTER — Encounter: Payer: Self-pay | Admitting: Family Medicine

## 2019-02-13 VITALS — BP 112/80 | HR 122 | Ht 64.0 in | Wt 157.0 lb

## 2019-02-13 DIAGNOSIS — M2550 Pain in unspecified joint: Secondary | ICD-10-CM

## 2019-02-13 DIAGNOSIS — M999 Biomechanical lesion, unspecified: Secondary | ICD-10-CM

## 2019-02-13 DIAGNOSIS — M62838 Other muscle spasm: Secondary | ICD-10-CM

## 2019-02-13 MED ORDER — TIZANIDINE HCL 4 MG PO TABS: 4.0000 mg | ORAL_TABLET | Freq: Every evening | ORAL | 2 refills | Status: DC

## 2019-02-13 MED ORDER — METHYLPREDNISOLONE ACETATE 80 MG/ML IJ SUSP
80.0000 mg | Freq: Once | INTRAMUSCULAR | Status: AC
  Administered 2019-02-13: 80 mg via INTRAMUSCULAR

## 2019-02-13 MED ORDER — KETOROLAC TROMETHAMINE 60 MG/2ML IM SOLN
60.0000 mg | Freq: Once | INTRAMUSCULAR | Status: AC
  Administered 2019-02-13: 60 mg via INTRAMUSCULAR

## 2019-02-13 NOTE — Assessment & Plan Note (Signed)
Decision today to treat with OMT was based on Physical Exam  After verbal consent patient was treated with HVLA, ME, FPR techniques in cervical, thoracic, lumbar and sacral areas  Patient tolerated the procedure well with improvement in symptoms  Patient given exercises, stretches and lifestyle modifications  See medications in patient instructions if given  Patient will follow up in 3-4 weeks  

## 2019-02-13 NOTE — Patient Instructions (Signed)
Good to see you  Ice is your friend Zanaflex at night if needed Ice 20 minutes 2 times daily. Usually after activity and before bed. Keep hands within peripheral vision  See me again in 2-3 weeks

## 2019-02-13 NOTE — Assessment & Plan Note (Signed)
Patient did have more of a spasm.  Responded fairly well to manipulation.  Injection of Toradol and Depo-Medrol given today tear to hopefully will help with pain.  Patient is not breast-feeding.  Discussed icing regimen and home exercise, topical anti-inflammatories.  Follow-up again in3-4 weeks

## 2019-02-13 NOTE — Progress Notes (Signed)
[name] [address] [place], [place] [address] Phone: [phone] Subjective:     CC: Neck pain follow-up  [id number]  Crystal [name] is a 38 y.o. female coming in with complaint of right sided cervical spine pain. Patient was picking up son this morning and put him back down and felt her neck start to spasm. Is having some burning down the right arm into the deltoid insertion. Has a hard time rotating head to the right. Feels relief with cervical flexion.      Past Medical History:  Diagnosis Date  . Allergy   . Asthma    Past Surgical History:  Procedure Laterality Date  . HYSTEROSCOPY WITH NOVASURE N/A 05/23/2014   Procedure: Velvet Bathe;  Surgeon: Cheri Fowler, MD;  Location: [facility];  Service: Gynecology;  Laterality: N/A;  . SKIN CANCER EXCISION     Social History   Socioeconomic History  . Marital status: Married    Spouse name: Not on file  . Number of children: Not on file  . Years of education: Not on file  . Highest education level: Not on file  Occupational History  . Not on file  Social Needs  . Financial resource strain: Not on file  . Food insecurity:    Worry: Not on file    Inability: Not on file  . Transportation needs:    Medical: Not on file    Non-medical: Not on file  Tobacco Use  . Smoking status: Never Smoker  . Smokeless tobacco: Never Used  Substance and Sexual Activity  . Alcohol use: Yes  . Drug use: No  . Sexual activity: Yes  Lifestyle  . Physical activity:    Days per week: Not on file    Minutes per session: Not on file  . Stress: Not on file  Relationships  . Social connections:    Talks on phone: Not on file    Gets together: Not on file    Attends religious service: Not on file    Active member of club or organization: Not on file    Attends meetings of clubs or organizations: Not on file    Relationship status: Not on file  Other Topics Concern  . Not on file  Social History  Narrative   Regular exercise- yes   Allergies  Allergen Reactions  . Dextromethorphan Hbr Rash   Family History  Problem Relation Age of Onset  . Fibromyalgia Father   . Autoimmune disease Father   . Cancer Maternal Grandmother        stomach  . Diabetes Maternal Grandmother   . Diabetes Paternal Grandmother   . Heart disease Paternal Grandmother   . Alcohol abuse Paternal Grandfather   . Cancer Paternal Grandfather        lung      Current Outpatient Medications (Respiratory):  .  albuterol (PROVENTIL HFA;VENTOLIN HFA) 108 (90 Base) MCG/ACT inhaler, Inhale 1-2 puffs into the lungs every 6 (six) hours as needed for wheezing or shortness of breath.    Current Outpatient Medications (Other):  .  amphetamine-dextroamphetamine (ADDERALL) 15 MG tablet, Take 15 mg by mouth daily. .  Prenatal Vit-Fe Fumarate-FA (PRENATAL MULTIVITAMIN) TABS tablet, Take 1 tablet by mouth daily at 12 noon. .  Vitamin D, Ergocalciferol, (DRISDOL) 1.25 MG (50000 UT) CAPS capsule, Take 1 capsule (50,000 Units total) by mouth every 7 (seven) days. Marland Kitchen  tiZANidine (ZANAFLEX) 4 MG tablet, Take 1 tablet (4 mg total)  by mouth Nightly for 10 days.    Past medical history, social, surgical and family history all reviewed in electronic medical record.  No pertanent information unless stated regarding to the chief complaint.   Review of Systems:  No headache, visual changes, nausea, vomiting, diarrhea, constipation, dizziness, abdominal pain, skin rash, fevers, chills, night sweats, weight loss, swollen lymph nodes, body aches, joint swelling, chest pain, shortness of breath, mood changes.  Positive muscle aches  Objective  Blood pressure 112/80, pulse (!) 122, height 5\' 4"  (1.626 m), weight 157 lb (71.2 kg), SpO2 99 %, unknown if currently breastfeeding.    General: No apparent distress alert and oriented x3 mood and affect normal, dressed appropriately.  HEENT: Pupils equal, extraocular movements intact    Respiratory: Patient's speak in full sentences and does not appear short of breath  Cardiovascular: No lower extremity edema, non tender, no erythema  Skin: Warm dry intact with no signs of infection or rash on extremities or on axial skeleton.  Abdomen: Soft nontender  Neuro: Cranial nerves II through XII are intact, neurovascularly intact in all extremities with 2+ DTRs and 2+ pulses.  Lymph: No lymphadenopathy of posterior or anterior cervical chain or axillae bilaterally.  Gait normal with good balance and coordination.  MSK:  Non tender with full range of motion and good stability and symmetric strength and tone of shoulders, elbows, wrist, hip, knee and ankles bilaterally.  Neck: Inspection unremarkable. No palpable stepoffs. Negative Spurling's maneuver. Mild loss of lordosis decreased range of motion with sidebending and rotation significantly on the right Grip strength and sensation normal in bilateral hands Strength good C4 to T1 distribution No sensory change to C4 to T1 Negative Hoffman sign bilaterally Reflexes normal Tightness of the right trapezius  Osteopathic findings C4 flexed rotated and side bent right T3 extended rotated and side bent right inhaled third rib T7 extended rotated and side bent left L2 flexed rotated and side bent right Sacrum right on right     Impression and Recommendations:     This case required medical decision making of moderate complexity. The above documentation has been reviewed and is accurate and complete Lyndal Pulley, DO       Note: This dictation was prepared with Dragon dictation along with smaller phrase technology. Any transcriptional errors that result from this process are unintentional.

## 2019-02-23 DIAGNOSIS — A932 Colorado tick fever: Secondary | ICD-10-CM | POA: Diagnosis not present

## 2019-03-06 ENCOUNTER — Ambulatory Visit: Payer: 59 | Admitting: Family Medicine

## 2019-03-06 ENCOUNTER — Encounter: Payer: Self-pay | Admitting: Family Medicine

## 2019-03-06 ENCOUNTER — Other Ambulatory Visit: Payer: Self-pay

## 2019-03-06 VITALS — BP 110/72 | HR 116 | Ht 64.0 in | Wt 159.0 lb

## 2019-03-06 DIAGNOSIS — M62838 Other muscle spasm: Secondary | ICD-10-CM

## 2019-03-06 DIAGNOSIS — M999 Biomechanical lesion, unspecified: Secondary | ICD-10-CM | POA: Diagnosis not present

## 2019-03-06 NOTE — Assessment & Plan Note (Signed)
More tightness noted today.  Perming some anxiety underneath elevated as well.  Discussed icing regimen and home exercises, which activities to do which wants to avoid.  Patient will increase activity slowly over the course of next several days.  Follow-up again in 4 to 8 weeks

## 2019-03-06 NOTE — Assessment & Plan Note (Signed)
Decision today to treat with OMT was based on Physical Exam  After verbal consent patient was treated with HVLA, ME, FPR techniques in cervical, thoracic, lumbar and sacral areas  Patient tolerated the procedure well with improvement in symptoms  Patient given exercises, stretches and lifestyle modifications  See medications in patient instructions if given  Patient will follow up in 4-8 weeks 

## 2019-03-06 NOTE — Progress Notes (Signed)
[name] [address] [place], [place] [address] Phone: [phone] Subjective:   [name], am serving as a scribe for Dr. Hulan Saas.   CC: Neck and back pain follow-up   [id number]   02/13/2019: Patient did have more of a spasm.  Responded fairly well to manipulation.  Injection of Toradol and Depo-Medrol given today tear to hopefully will help with pain.  Patient is not breast-feeding.  Discussed icing regimen and home exercise, topical anti-inflammatories.  Follow-up again in3-4 weeks  Update 03/06/2019: Crystal [name] is a 38 y.o. female coming in with complaint of neck pain. Patient states that she found a tick bite since last visit. She ended up having some lymph node swelling in the back of her neck on right side,right side of face and ear. Was very point tender. Did go to Urgent Care. Is doing better but still having residual neck pain.     Past Medical History:  Diagnosis Date  . Allergy   . Asthma    Past Surgical History:  Procedure Laterality Date  . HYSTEROSCOPY WITH NOVASURE N/A 05/23/2014   Procedure: Velvet Bathe;  Surgeon: Cheri Fowler, MD;  Location: [facility];  Service: Gynecology;  Laterality: N/A;  . SKIN CANCER EXCISION     Social History   Socioeconomic History  . Marital status: Married    Spouse name: Not on file  . Number of children: Not on file  . Years of education: Not on file  . Highest education level: Not on file  Occupational History  . Not on file  Social Needs  . Financial resource strain: Not on file  . Food insecurity:    Worry: Not on file    Inability: Not on file  . Transportation needs:    Medical: Not on file    Non-medical: Not on file  Tobacco Use  . Smoking status: Never Smoker  . Smokeless tobacco: Never Used  Substance and Sexual Activity  . Alcohol use: Yes  . Drug use: No  . Sexual activity: Yes  Lifestyle  . Physical activity:    Days per week: Not on file    Minutes  per session: Not on file  . Stress: Not on file  Relationships  . Social connections:    Talks on phone: Not on file    Gets together: Not on file    Attends religious service: Not on file    Active member of club or organization: Not on file    Attends meetings of clubs or organizations: Not on file    Relationship status: Not on file  Other Topics Concern  . Not on file  Social History Narrative   Regular exercise- yes   Allergies  Allergen Reactions  . Dextromethorphan Hbr Rash   Family History  Problem Relation Age of Onset  . Fibromyalgia Father   . Autoimmune disease Father   . Cancer Maternal Grandmother        stomach  . Diabetes Maternal Grandmother   . Diabetes Paternal Grandmother   . Heart disease Paternal Grandmother   . Alcohol abuse Paternal Grandfather   . Cancer Paternal Grandfather        lung      Current Outpatient Medications (Respiratory):  .  albuterol (PROVENTIL HFA;VENTOLIN HFA) 108 (90 Base) MCG/ACT inhaler, Inhale 1-2 puffs into the lungs every 6 (six) hours as needed for wheezing or shortness of breath.    Current Outpatient Medications (Other):  .  amphetamine-dextroamphetamine (ADDERALL) 15 MG tablet, Take 15 mg by mouth daily. .  Prenatal Vit-Fe Fumarate-FA (PRENATAL MULTIVITAMIN) TABS tablet, Take 1 tablet by mouth daily at 12 noon. .  Vitamin D, Ergocalciferol, (DRISDOL) 1.25 MG (50000 UT) CAPS capsule, Take 1 capsule (50,000 Units total) by mouth every 7 (seven) days.    Past medical history, social, surgical and family history all reviewed in electronic medical record.  No pertanent information unless stated regarding to the chief complaint.   Review of Systems:  No headache, visual changes, nausea, vomiting, diarrhea, constipation, dizziness, abdominal pain, skin rash, fevers, chills, night sweats, weight loss, swollen lymph nodes, body aches, joint swelling, chest pain, shortness of breath, mood changes.  Positive muscle  aches  Objective  Blood pressure 110/72, pulse (!) 116, height 5\' 4"  (1.626 m), weight 159 lb (72.1 kg), SpO2 97 %, unknown if currently breastfeeding.   General: No apparent distress alert and oriented x3 mood and affect normal, dressed appropriately.  HEENT: Pupils equal, extraocular movements intact  Respiratory: Patient's speak in full sentences and does not appear short of breath  Cardiovascular: No lower extremity edema, non tender, no erythema  Skin: Warm dry intact with no signs of infection or rash on extremities or on axial skeleton.  Abdomen: Soft nontender  Neuro: Cranial nerves II through XII are intact, neurovascularly intact in all extremities with 2+ DTRs and 2+ pulses.  Lymph: No lymphadenopathy of posterior or anterior cervical chain or axillae bilaterally.  Gait normal with good balance and coordination.  MSK:  Non tender with full range of motion and good stability and symmetric strength and tone of shoulders, elbows, wrist, hip, knee and ankles bilaterally.  Neck: Inspection loss of lordosis. No palpable stepoffs. Negative Spurling's maneuver. Full neck range of motion Grip strength and sensation normal in bilateral hands Strength good C4 to T1 distribution No sensory change to C4 to T1 Negative Hoffman sign bilaterally Reflexes normal Tightness in the right trapezius  Osteopathic findings C2 flexed rotated and side bent right C6 flexed rotated and side bent left T3 extended rotated and side bent right inhaled third rib T5 extended rotated and side bent left L2 flexed rotated and side bent right Sacrum right on right      Impression and Recommendations:     This case required medical decision making of moderate complexity. The above documentation has been reviewed and is accurate and complete Lyndal Pulley, DO       Note: This dictation was prepared with Dragon dictation along with smaller phrase technology. Any transcriptional errors that result  from this process are unintentional.

## 2019-03-06 NOTE — Patient Instructions (Signed)
4-5 weeks Stay away from dogs and ticks

## 2019-03-11 ENCOUNTER — Other Ambulatory Visit: Payer: Self-pay | Admitting: *Deleted

## 2019-03-11 MED ORDER — DOXYCYCLINE HYCLATE 100 MG PO TABS: 100.0000 mg | ORAL_TABLET | Freq: Two times a day (BID) | ORAL | 0 refills | Status: DC

## 2019-03-12 MED FILL — AMPHETAMINE-DEXTROAMPHETAMI: 15 | 30 days supply | Qty: 30 | Fill #0

## 2019-03-12 MED FILL — AMPHETAMINE-DEXTROAMPHETAMI: 5 | 15 days supply | Qty: 30 | Fill #0

## 2019-03-18 MED FILL — VIT D2 1.25 MG (50,000 UNIT: 1.25 MG | 28 days supply | Qty: 4 | Fill #2

## 2019-04-01 ENCOUNTER — Telehealth: Payer: Self-pay

## 2019-04-01 NOTE — Telephone Encounter (Signed)
Called patient and left message to reschedule 04/10/2019

## 2019-04-10 ENCOUNTER — Ambulatory Visit: Payer: 59 | Admitting: Family Medicine

## 2019-04-10 NOTE — Progress Notes (Deleted)
[name] [address] [place], [place] [address] Phone: [phone] Subjective:    I'm seeing this patient by the request  of:    CC:   [id number]  Crystal [name] is a 38 y.o. female coming in with complaint of ***  Onset-  Location Duration-  Character- Aggravating factors- Reliving factors-  Therapies tried-  Severity-     Past Medical History:  Diagnosis Date  . Allergy   . Asthma    Past Surgical History:  Procedure Laterality Date  . HYSTEROSCOPY WITH NOVASURE N/A 05/23/2014   Procedure: Velvet Bathe;  Surgeon: Cheri Fowler, MD;  Location: [facility];  Service: Gynecology;  Laterality: N/A;  . SKIN CANCER EXCISION     Social History   Socioeconomic History  . Marital status: Married    Spouse name: Not on file  . Number of children: Not on file  . Years of education: Not on file  . Highest education level: Not on file  Occupational History  . Not on file  Social Needs  . Financial resource strain: Not on file  . Food insecurity:    Worry: Not on file    Inability: Not on file  . Transportation needs:    Medical: Not on file    Non-medical: Not on file  Tobacco Use  . Smoking status: Never Smoker  . Smokeless tobacco: Never Used  Substance and Sexual Activity  . Alcohol use: Yes  . Drug use: No  . Sexual activity: Yes  Lifestyle  . Physical activity:    Days per week: Not on file    Minutes per session: Not on file  . Stress: Not on file  Relationships  . Social connections:    Talks on phone: Not on file    Gets together: Not on file    Attends religious service: Not on file    Active member of club or organization: Not on file    Attends meetings of clubs or organizations: Not on file    Relationship status: Not on file  Other Topics Concern  . Not on file  Social History Narrative   Regular exercise- yes   Allergies  Allergen Reactions  . Dextromethorphan Hbr Rash   Family History  Problem  Relation Age of Onset  . Fibromyalgia Father   . Autoimmune disease Father   . Cancer Maternal Grandmother        stomach  . Diabetes Maternal Grandmother   . Diabetes Paternal Grandmother   . Heart disease Paternal Grandmother   . Alcohol abuse Paternal Grandfather   . Cancer Paternal Grandfather        lung      Current Outpatient Medications (Respiratory):  .  albuterol (PROVENTIL HFA;VENTOLIN HFA) 108 (90 Base) MCG/ACT inhaler, Inhale 1-2 puffs into the lungs every 6 (six) hours as needed for wheezing or shortness of breath.    Current Outpatient Medications (Other):  .  amphetamine-dextroamphetamine (ADDERALL) 15 MG tablet, Take 15 mg by mouth daily. Marland Kitchen  doxycycline (VIBRA-TABS) 100 MG tablet, Take 1 tablet (100 mg total) by mouth 2 (two) times daily. .  Prenatal Vit-Fe Fumarate-FA (PRENATAL MULTIVITAMIN) TABS tablet, Take 1 tablet by mouth daily at 12 noon. .  Vitamin D, Ergocalciferol, (DRISDOL) 1.25 MG (50000 UT) CAPS capsule, Take 1 capsule (50,000 Units total) by mouth every 7 (seven) days.    Past medical history, social, surgical and family history all reviewed in electronic medical record.  No  pertanent information unless stated regarding to the chief complaint.   Review of Systems:  No headache, visual changes, nausea, vomiting, diarrhea, constipation, dizziness, abdominal pain, skin rash, fevers, chills, night sweats, weight loss, swollen lymph nodes, body aches, joint swelling, muscle aches, chest pain, shortness of breath, mood changes.   Objective  unknown if currently breastfeeding. Systems examined below as of    General: No apparent distress alert and oriented x3 mood and affect normal, dressed appropriately.  HEENT: Pupils equal, extraocular movements intact  Respiratory: Patient's speak in full sentences and does not appear short of breath  Cardiovascular: No lower extremity edema, non tender, no erythema  Skin: Warm dry intact with no signs of infection  or rash on extremities or on axial skeleton.  Abdomen: Soft nontender  Neuro: Cranial nerves II through XII are intact, neurovascularly intact in all extremities with 2+ DTRs and 2+ pulses.  Lymph: No lymphadenopathy of posterior or anterior cervical chain or axillae bilaterally.  Gait normal with good balance and coordination.  MSK:  Non tender with full range of motion and good stability and symmetric strength and tone of shoulders, elbows, wrist, hip, knee and ankles bilaterally.     Impression and Recommendations:     This case required medical decision making of moderate complexity. The above documentation has been reviewed and is accurate and complete Lyndal Pulley, DO       Note: This dictation was prepared with Dragon dictation along with smaller phrase technology. Any transcriptional errors that result from this process are unintentional.

## 2019-04-16 DIAGNOSIS — R87619 Unspecified abnormal cytological findings in specimens from cervix uteri: Secondary | ICD-10-CM | POA: Diagnosis not present

## 2019-04-16 DIAGNOSIS — R87611 Atypical squamous cells cannot exclude high grade squamous intraepithelial lesion on cytologic smear of cervix (ASC-H): Secondary | ICD-10-CM | POA: Diagnosis not present

## 2019-06-13 ENCOUNTER — Other Ambulatory Visit: Payer: Self-pay

## 2019-06-13 MED ORDER — VENLAFAXINE HCL ER 37.5 MG PO CP24: 37.5000 mg | ORAL_CAPSULE | Freq: Every day | ORAL | 0 refills | Status: DC

## 2019-06-18 ENCOUNTER — Other Ambulatory Visit: Payer: Self-pay | Admitting: Family Medicine

## 2019-07-07 ENCOUNTER — Other Ambulatory Visit: Payer: Self-pay | Admitting: Family Medicine

## 2019-07-15 ENCOUNTER — Other Ambulatory Visit: Payer: Self-pay | Admitting: *Deleted

## 2019-07-15 MED ORDER — VENLAFAXINE HCL ER 37.5 MG PO CP24: 37.5000 mg | ORAL_CAPSULE | Freq: Every day | ORAL | 0 refills | Status: DC

## 2019-08-08 ENCOUNTER — Other Ambulatory Visit: Payer: Self-pay | Admitting: Family Medicine

## 2019-08-14 ENCOUNTER — Other Ambulatory Visit: Payer: Self-pay | Admitting: *Deleted

## 2019-08-14 MED ORDER — VENLAFAXINE HCL ER 37.5 MG PO CP24: 37.5000 mg | ORAL_CAPSULE | Freq: Every day | ORAL | 0 refills | Status: DC

## 2019-08-30 ENCOUNTER — Ambulatory Visit: Payer: 59 | Admitting: Family Medicine

## 2019-09-06 ENCOUNTER — Other Ambulatory Visit: Payer: Self-pay | Admitting: Family Medicine

## 2019-09-06 ENCOUNTER — Other Ambulatory Visit: Payer: Self-pay

## 2019-09-06 MED ORDER — VENLAFAXINE HCL ER 37.5 MG PO CP24: 37.5000 mg | ORAL_CAPSULE | Freq: Every day | ORAL | 0 refills | Status: DC

## 2019-10-31 NOTE — Progress Notes (Signed)
[name] [address] [place], [place] [address] Phone: [phone] Subjective:   I Crystal [name] am serving as a Education administrator for Dr. Hulan Saas.   CC: Neck and upper back pain  [id number]  Crystal [name] is a 38 y.o. female coming in with complaint of neck pain. States she is not feeling great today. Numbness and tingling in the right hand and occassionally in the left that radiates into the forearm.  Patient describes it as a dull, throbbing aching pain.  Patient has had more tightness recently.  Rates the severity of pain is 7 out of 10      Past Medical History:  Diagnosis Date   Allergy    Asthma    Past Surgical History:  Procedure Laterality Date   HYSTEROSCOPY WITH NOVASURE N/A 05/23/2014   Procedure: Velvet Bathe;  Surgeon: Cheri Fowler, MD;  Location: [facility];  Service: Gynecology;  Laterality: N/A;   SKIN CANCER EXCISION     Social History   Socioeconomic History   Marital status: Married    Spouse name: Not on file   Number of children: Not on file   Years of education: Not on file   Highest education level: Not on file  Occupational History   Not on file  Social Needs   Financial resource strain: Not on file   Food insecurity    Worry: Not on file    Inability: Not on file   Transportation needs    Medical: Not on file    Non-medical: Not on file  Tobacco Use   Smoking status: Never Smoker   Smokeless tobacco: Never Used  Substance and Sexual Activity   Alcohol use: Yes   Drug use: No   Sexual activity: Yes  Lifestyle   Physical activity    Days per week: Not on file    Minutes per session: Not on file   Stress: Not on file  Relationships   Social connections    Talks on phone: Not on file    Gets together: Not on file    Attends religious service: Not on file    Active member of club or organization: Not on file    Attends meetings of clubs or organizations: Not on file    Relationship  status: Not on file  Other Topics Concern   Not on file  Social History Narrative   Regular exercise- yes   Allergies  Allergen Reactions   Dextromethorphan Hbr Rash   Family History  Problem Relation Age of Onset   Fibromyalgia Father    Autoimmune disease Father    Cancer Maternal Grandmother        stomach   Diabetes Maternal Grandmother    Diabetes Paternal Grandmother    Heart disease Paternal Grandmother    Alcohol abuse Paternal Grandfather    Cancer Paternal Grandfather        lung    Current Outpatient Medications (Endocrine & Metabolic):    predniSONE (DELTASONE) 50 MG tablet, Take one tablet daily for the next 5 days.   Current Outpatient Medications (Respiratory):    albuterol (PROVENTIL HFA;VENTOLIN HFA) 108 (90 Base) MCG/ACT inhaler, Inhale 1-2 puffs into the lungs every 6 (six) hours as needed for wheezing or shortness of breath.    Current Outpatient Medications (Other):    amphetamine-dextroamphetamine (ADDERALL) 15 MG tablet, Take 15 mg by mouth daily.   doxycycline (VIBRA-TABS) 100 MG tablet, Take 1 tablet (100 mg total) by  mouth 2 (two) times daily.   Prenatal Vit-Fe Fumarate-FA (PRENATAL MULTIVITAMIN) TABS tablet, Take 1 tablet by mouth daily at 12 noon.   venlafaxine XR (EFFEXOR-XR) 37.5 MG 24 hr capsule, Take 1 capsule (37.5 mg total) by mouth daily with breakfast.   Vitamin D, Ergocalciferol, (DRISDOL) 1.25 MG (50000 UT) CAPS capsule, TAKE 1 CAPSULE BY MOUTH EVERY 7 DAYS   gabapentin (NEURONTIN) 100 MG capsule, Take 2 capsules (200 mg total) by mouth at bedtime.    Past medical history, social, surgical and family history all reviewed in electronic medical record.  No pertanent information unless stated regarding to the chief complaint.   Review of Systems:  No headache, visual changes, nausea, vomiting, diarrhea, constipation, dizziness, abdominal pain, skin rash, fevers, chills, night sweats, weight loss, swollen lymph nodes,  body aches, joint swelling, chest pain, shortness of breath, mood changes.  Positive muscle aches  Objective  Blood pressure 100/70, pulse (!) 108, height 5\' 4"  (1.626 m), weight 164 lb (74.4 kg), SpO2 98 %, unknown if currently breastfeeding.    General: No apparent distress alert and oriented x3 mood and affect normal, dressed appropriately.  HEENT: Pupils equal, extraocular movements intact  Respiratory: Patient's speak in full sentences and does not appear short of breath  Cardiovascular: Trace lower extremity edema, non tender, no erythema  Skin: Warm dry intact with no signs of infection or rash on extremities or on axial skeleton.  Abdomen: Soft nontender  Neuro: Cranial nerves II through XII are intact, neurovascularly intact in all extremities with 2+ DTRs and 2+ pulses.  Lymph: No lymphadenopathy of posterior or anterior cervical chain or axillae bilaterally.  Gait normal with good balance and coordination.  MSK:  tender with full range of motion and good stability and symmetric strength and tone of shoulders, elbows, wrist, hip, knee and ankles bilaterally.  Neck exam is somewhat tense.  Does like to last 5 to 10 degrees of extension.  Negative Spurling's.  Tightness in the parascapular region.  Osteopathic findings  C4 flexed rotated and side bent left C6 flexed rotated and side bent left T5 extended rotated and side bent right inhaled rib T9 extended rotated and side bent left L2 flexed rotated and side bent right Sacrum right on right    Impression and Recommendations:     This case required medical decision making of moderate complexity. The above documentation has been reviewed and is accurate and complete Lyndal Pulley, DO       Note: This dictation was prepared with Dragon dictation along with smaller phrase technology. Any transcriptional errors that result from this process are unintentional.

## 2019-11-01 ENCOUNTER — Ambulatory Visit: Payer: 59 | Admitting: Family Medicine

## 2019-11-01 ENCOUNTER — Other Ambulatory Visit: Payer: Self-pay

## 2019-11-01 ENCOUNTER — Encounter: Payer: Self-pay | Admitting: Family Medicine

## 2019-11-01 VITALS — BP 100/70 | HR 108 | Ht 64.0 in | Wt 164.0 lb

## 2019-11-01 DIAGNOSIS — M62838 Other muscle spasm: Secondary | ICD-10-CM

## 2019-11-01 DIAGNOSIS — M999 Biomechanical lesion, unspecified: Secondary | ICD-10-CM

## 2019-11-01 MED ORDER — GABAPENTIN 100 MG PO CAPS: 200.0000 mg | ORAL_CAPSULE | Freq: Every day | ORAL | 0 refills | Status: DC

## 2019-11-01 MED ORDER — PREDNISONE 50 MG PO TABS: ORAL_TABLET | ORAL | 0 refills | Status: DC

## 2019-11-01 NOTE — Assessment & Plan Note (Signed)
Decision today to treat with OMT was based on Physical Exam  After verbal consent patient was treated with HVLA, ME, FPR techniques in cervical, thoracic, lumbar and sacral areas  Patient tolerated the procedure well with improvement in symptoms  Patient given exercises, stretches and lifestyle modifications  See medications in patient instructions if given  Patient will follow up in 4-8 weeks 

## 2019-11-01 NOTE — Assessment & Plan Note (Signed)
Patient did have significant spasm noted.  Discussed with patient icing regimen.  Discussed which activities to do which wants to avoid.  Patient will increase activity as tolerated.  Follow-up again in 4 to 8 weeks.

## 2019-11-01 NOTE — Patient Instructions (Addendum)
Prednisone 5 days Gabapentin at bedtime 200mg  See me in 5 weeks

## 2019-12-06 ENCOUNTER — Ambulatory Visit: Payer: 59 | Admitting: Family Medicine

## 2019-12-06 ENCOUNTER — Other Ambulatory Visit: Payer: Self-pay

## 2019-12-06 ENCOUNTER — Encounter: Payer: Self-pay | Admitting: Family Medicine

## 2019-12-06 ENCOUNTER — Other Ambulatory Visit: Payer: 59

## 2019-12-06 VITALS — BP 122/82 | HR 99 | Ht 64.0 in | Wt 166.0 lb

## 2019-12-06 DIAGNOSIS — M62838 Other muscle spasm: Secondary | ICD-10-CM | POA: Diagnosis not present

## 2019-12-06 DIAGNOSIS — M999 Biomechanical lesion, unspecified: Secondary | ICD-10-CM | POA: Diagnosis not present

## 2019-12-06 DIAGNOSIS — M2550 Pain in unspecified joint: Secondary | ICD-10-CM

## 2019-12-06 NOTE — Progress Notes (Signed)
[name] [address] [place], [place] [address] Phone: [phone] Subjective:   Crystal [name], am serving as a scribe for Dr. Hulan Saas.  This visit occurred during the SARS-CoV-2 public health emergency.  Safety protocols were in place, including screening questions prior to the visit, additional usage of staff PPE, and extensive cleaning of exam room while observing appropriate contact time as indicated for disinfecting solutions.   CC: Neck/upper back pain  [id number]    11/01/19: Patient did have significant spasm noted.  Discussed with patient icing regimen.  Discussed which activities to do which wants to avoid.  Patient will increase activity as tolerated.  Follow-up again in 4 to 8 weeks.  12/06/19: Crystal [name] is a 37 y.o. female coming in with complaint of neck/upper back pain. States that she has had improvement but does have pain over the right lat.  Patient states some mild discomfort and pain overall but nothing severe.     Past Medical History:  Diagnosis Date  . Allergy   . Asthma    Past Surgical History:  Procedure Laterality Date  . HYSTEROSCOPY WITH NOVASURE N/A 05/23/2014   Procedure: Velvet Bathe;  Surgeon: Cheri Fowler, MD;  Location: [facility];  Service: Gynecology;  Laterality: N/A;  . SKIN CANCER EXCISION     Social History   Socioeconomic History  . Marital status: Married    Spouse name: Not on file  . Number of children: Not on file  . Years of education: Not on file  . Highest education level: Not on file  Occupational History  . Not on file  Tobacco Use  . Smoking status: Never Smoker  . Smokeless tobacco: Never Used  Substance and Sexual Activity  . Alcohol use: Yes  . Drug use: [name]  . Sexual activity: Yes  Other Topics Concern  . Not on file  Social History Narrative   Regular exercise- yes   Social Determinants of Health   Financial Resource Strain:   . Difficulty of Paying Living  Expenses: Not on file  Food Insecurity:   . Worried About Charity fundraiser in the Last Year: Not on file  . Ran Out of Food in the Last Year: Not on file  Transportation Needs:   . Lack of Transportation (Medical): Not on file  . Lack of Transportation (Non-Medical): Not on file  Physical Activity:   . Days of Exercise per Week: Not on file  . Minutes of Exercise per Session: Not on file  Stress:   . Feeling of Stress : Not on file  Social Connections:   . Frequency of Communication with Friends and Family: Not on file  . Frequency of Social Gatherings with Friends and Family: Not on file  . Attends Religious Services: Not on file  . Active Member of Clubs or Organizations: Not on file  . Attends Archivist Meetings: Not on file  . Marital Status: Not on file   Allergies  Allergen Reactions  . Dextromethorphan Hbr Rash   Family History  Problem Relation Age of Onset  . Fibromyalgia Father   . Autoimmune disease Father   . Cancer Maternal Grandmother        stomach  . Diabetes Maternal Grandmother   . Diabetes Paternal Grandmother   . Heart disease Paternal Grandmother   . Alcohol abuse Paternal Grandfather   . Cancer Paternal Grandfather        lung  Current Outpatient Medications (Endocrine & Metabolic):  .  predniSONE (DELTASONE) 50 MG tablet, Take one tablet daily for the next 5 days.   Current Outpatient Medications (Respiratory):  .  albuterol (PROVENTIL HFA;VENTOLIN HFA) 108 (90 Base) MCG/ACT inhaler, Inhale 1-2 puffs into the lungs every 6 (six) hours as needed for wheezing or shortness of breath.    Current Outpatient Medications (Other):  .  amphetamine-dextroamphetamine (ADDERALL) 15 MG tablet, Take 15 mg by mouth daily. Marland Kitchen  doxycycline (VIBRA-TABS) 100 MG tablet, Take 1 tablet (100 mg total) by mouth 2 (two) times daily. Marland Kitchen  gabapentin (NEURONTIN) 100 MG capsule, Take 2 capsules (200 mg total) by mouth at bedtime. .  Prenatal Vit-Fe  Fumarate-FA (PRENATAL MULTIVITAMIN) TABS tablet, Take 1 tablet by mouth daily at 12 noon. .  venlafaxine XR (EFFEXOR-XR) 37.5 MG 24 hr capsule, Take 1 capsule (37.5 mg total) by mouth daily with breakfast. .  Vitamin D, Ergocalciferol, (DRISDOL) 1.25 MG (50000 UT) CAPS capsule, TAKE 1 CAPSULE BY MOUTH EVERY 7 DAYS    Past medical history, social, surgical and family history all reviewed in electronic medical record.  [name] pertanent information unless stated regarding to the chief complaint.   Review of Systems:  [name] headache, visual changes, nausea, vomiting, diarrhea, constipation, dizziness, abdominal pain, skin rash, fevers, chills, night sweats, weight loss, swollen lymph nodes, body aches, joint swelling,  chest pain, shortness of breath, mood changes.  Positive muscle aches Objective  Blood pressure 122/82, pulse 99, height 5\' 4"  (1.626 m), weight 166 lb (75.3 kg), SpO2 99 %, unknown if currently breastfeeding.    General: [name] apparent distress alert and oriented x3 mood and affect normal, dressed appropriately.  HEENT: Pupils equal, extraocular movements intact  Respiratory: Patient's speak in full sentences and does not appear short of breath  Cardiovascular: [name] lower extremity edema, non tender, [name] erythema  Skin: Warm dry intact with [name] signs of infection or rash on extremities or on axial skeleton.  Abdomen: Soft nontender  Neuro: Cranial nerves II through XII are intact, neurovascularly intact in all extremities with 2+ DTRs and 2+ pulses.  Lymph: [name] lymphadenopathy of posterior or anterior cervical chain or axillae bilaterally.  Gait normal with good balance and coordination.  MSK:  Non tender with full range of motion and good stability and symmetric strength and tone of shoulders, elbows, wrist, hip, knee and ankles bilaterally.  Upper back stiffness and tightness.  Patient does have tenderness to palpation in the parascapular region.  Right greater than left.  Patient is able to  open deep breath without any significant discomfort though.  Neck pain paraspinal musculature lumbar spine also noted as well as in the paraspinal of the cervical region.  Very diffuse and nonspecific.  5 out of 5 strength of the upper extremities.  Osteopathic findings C2 flexed rotated and side bent right C6 flexed rotated and side bent left T3 extended rotated and side bent right inhaled third rib T9 extended rotated and side bent left L4 flexed rotated and side bent left Sacrum right on right     Impression and Recommendations:     This case required medical decision making of moderate complexity. The above documentation has been reviewed and is accurate and complete Lyndal Pulley, DO       Note: This dictation was prepared with Dragon dictation along with smaller phrase technology. Any transcriptional errors that result from this process are unintentional.

## 2019-12-06 NOTE — Assessment & Plan Note (Signed)
Patient is to continue to work on posture and ergonomics, patient does have some very mild scapular dyskinesis that can contribute to some of the discomfort and pain.  Discussed icing regimen, topical anti-inflammatories, which activities to do which wants to avoid.  Patient will increase activity as tolerated.  Follow-up again in 4 to 8 hours

## 2019-12-06 NOTE — Assessment & Plan Note (Signed)
Decision today to treat with OMT was based on Physical Exam  After verbal consent patient was treated with HVLA, ME, FPR techniques in cervical, thoracic rib, , lumbar and sacral areas  Patient tolerated the procedure well with improvement in symptoms  Patient given exercises, stretches and lifestyle modifications  See medications in patient instructions if given  Patient will follow up in 4-8 weeks

## 2019-12-06 NOTE — Patient Instructions (Signed)
  [address], [address] [place], [place] [address] Phone [phone]  See me again in 4-6 weeks

## 2019-12-07 LAB — SAR COV2 SEROLOGY (COVID19)AB(IGG),IA: SARS CoV2 AB IGG: NEGATIVE

## 2019-12-10 ENCOUNTER — Other Ambulatory Visit: Payer: Self-pay | Admitting: Family Medicine

## 2019-12-25 ENCOUNTER — Other Ambulatory Visit: Payer: Self-pay | Admitting: Family Medicine

## 2020-01-02 ENCOUNTER — Encounter: Payer: Self-pay | Admitting: Family Medicine

## 2020-01-02 ENCOUNTER — Ambulatory Visit (INDEPENDENT_AMBULATORY_CARE_PROVIDER_SITE_OTHER): Payer: 59 | Admitting: Family Medicine

## 2020-01-02 ENCOUNTER — Other Ambulatory Visit: Payer: Self-pay

## 2020-01-02 VITALS — BP 124/88 | HR 96 | Ht 64.0 in

## 2020-01-02 DIAGNOSIS — M62838 Other muscle spasm: Secondary | ICD-10-CM | POA: Diagnosis not present

## 2020-01-02 DIAGNOSIS — M999 Biomechanical lesion, unspecified: Secondary | ICD-10-CM | POA: Diagnosis not present

## 2020-01-02 MED ORDER — VITAMIN D (ERGOCALCIFEROL) 1.25 MG (50000 UNIT) PO CAPS: 50000.0000 [IU] | ORAL_CAPSULE | ORAL | 0 refills | Status: DC

## 2020-01-02 NOTE — Assessment & Plan Note (Signed)
Decision today to treat with OMT was based on Physical Exam  After verbal consent patient was treated with HVLA, ME, FPR techniques in cervical, thoracic, rib,  lumbar and sacral areas  Patient tolerated the procedure well with improvement in symptoms  Patient given exercises, stretches and lifestyle modifications  See medications in patient instructions if given  Patient will follow up in 4-8 weeks 

## 2020-01-02 NOTE — Progress Notes (Signed)
[place] Phone: [phone] Subjective:   [name], am serving as a scribe for Dr. Hulan Saas. This visit occurred during the SARS-CoV-2 public health emergency.  Safety protocols were in place, including screening questions prior to the visit, additional usage of staff PPE, and extensive cleaning of exam room while observing appropriate contact time as indicated for disinfecting solutions.     CC: Neck pain follow-up  [id number]  Crystal [name] is a 39 y.o. female coming in with complaint of neck pain. Last seen on 12/06/2019 for OMT. Patient states that she does have tingling in right arm but that is improving.  Patient is trying to stay active.  Doing different work at home that seems to be causing some of it.  Some mild anxiety as well though could be contributing to some of the discomfort and pain.  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     Past Medical History:  Diagnosis Date  . Allergy   . Asthma    Past Surgical History:  Procedure Laterality Date  . HYSTEROSCOPY WITH NOVASURE N/A 05/23/2014   Procedure: Velvet Bathe;  Surgeon: Cheri Fowler, MD;  Location: [facility];  Service: Gynecology;  Laterality: N/A;  . SKIN CANCER EXCISION     Social History   Socioeconomic History  . Marital status: Married    Spouse name: Not on file  . Number of children: Not on file  . Years of education: Not on file  . Highest education level: Not on file  Occupational History  . Not on file  Tobacco Use  . Smoking status: Never Smoker  . Smokeless tobacco: Never Used  Substance and Sexual Activity  . Alcohol use: Yes  . Drug use: No  . Sexual activity: Yes  Other Topics Concern  . Not on file  Social History Narrative   Regular exercise- yes   Social Determinants of Health   Financial Resource Strain:   . Difficulty of Paying Living Expenses: Not on file   Food Insecurity:   . Worried About Charity fundraiser in the Last Year: Not on file  . Ran Out of Food in the Last Year: Not on file  Transportation Needs:   . Lack of Transportation (Medical): Not on file  . Lack of Transportation (Non-Medical): Not on file  Physical Activity:   . Days of Exercise per Week: Not on file  . Minutes of Exercise per Session: Not on file  Stress:   . Feeling of Stress : Not on file  Social Connections:   . Frequency of Communication with Friends and Family: Not on file  . Frequency of Social Gatherings with Friends and Family: Not on file  . Attends Religious Services: Not on file  . Active Member of Clubs or Organizations: Not on file  . Attends Archivist Meetings: Not on file  . Marital Status: Not on file   Allergies  Allergen Reactions  . Dextromethorphan Hbr Rash   Family History  Problem Relation Age of Onset  . Fibromyalgia Father   . Autoimmune disease Father   . Cancer Maternal Grandmother        stomach  . Diabetes Maternal Grandmother   . Diabetes Paternal Grandmother   . Heart disease Paternal Grandmother   . Alcohol abuse Paternal Grandfather   . Cancer Paternal Grandfather  lung    Current Outpatient Medications (Endocrine & Metabolic):  .  predniSONE (DELTASONE) 50 MG tablet, Take one tablet daily for the next 5 days.   Current Outpatient Medications (Respiratory):  .  albuterol (PROVENTIL HFA;VENTOLIN HFA) 108 (90 Base) MCG/ACT inhaler, Inhale 1-2 puffs into the lungs every 6 (six) hours as needed for wheezing or shortness of breath.    Current Outpatient Medications (Other):  .  amphetamine-dextroamphetamine (ADDERALL) 15 MG tablet, Take 15 mg by mouth daily. Marland Kitchen  doxycycline (VIBRA-TABS) 100 MG tablet, Take 1 tablet (100 mg total) by mouth 2 (two) times daily. Marland Kitchen  gabapentin (NEURONTIN) 100 MG capsule, Take 2 capsules (200 mg total) by mouth at bedtime. .  Prenatal Vit-Fe Fumarate-FA (PRENATAL  MULTIVITAMIN) TABS tablet, Take 1 tablet by mouth daily at 12 noon. Marland Kitchen  tiZANidine (ZANAFLEX) 4 MG tablet, TAKE 1 TABLET (4 MG TOTAL) BY MOUTH NIGHTLY FOR 10 DAYS. Marland Kitchen  venlafaxine XR (EFFEXOR-XR) 37.5 MG 24 hr capsule, TAKE 1 CAPSULE (37.5 MG TOTAL) BY MOUTH DAILY WITH BREAKFAST. Marland Kitchen  Vitamin D, Ergocalciferol, (DRISDOL) 1.25 MG (50000 UT) CAPS capsule, TAKE 1 CAPSULE BY MOUTH EVERY 7 DAYS .  Vitamin D, Ergocalciferol, (DRISDOL) 1.25 MG (50000 UT) CAPS capsule, Take 1 capsule (50,000 Units total) by mouth every 7 (seven) days.    Past medical history, social, surgical and family history all reviewed in electronic medical record.  No pertanent information unless stated regarding to the chief complaint.   Review of Systems:  No headache, visual changes, nausea, vomiting, diarrhea, constipation, dizziness, abdominal pain, skin rash, fevers, chills, night sweats, weight loss, swollen lymph nodes, body aches, joint swelling,chest pain, shortness of breath, mood changes.  Positive muscle aches  Objective  Blood pressure 124/88, pulse 96, height 5\' 4"  (1.626 m), SpO2 98 %, unknown if currently breastfeeding.    General: No apparent distress alert and oriented x3 mood and affect normal, dressed appropriately.  HEENT: Pupils equal, extraocular movements intact  Respiratory: Patient's speak in full sentences and does not appear short of breath  Cardiovascular: No lower extremity edema, non tender, no erythema  Skin: Warm dry intact with no signs of infection or rash on extremities or on axial skeleton.  Abdomen: Soft nontender  Neuro: Cranial nerves II through XII are intact, neurovascularly intact in all extremities with 2+ DTRs and 2+ pulses.  Lymph: No lymphadenopathy of posterior or anterior cervical chain or axillae bilaterally.  Gait normal with good balance and coordination.  MSK:  tender with full range of motion and good stability and symmetric strength and tone of shoulders, elbows, wrist, hip,  knee and ankles bilaterally.  Neck exam does have some loss of lordosis.  Some mild scapular dyskinesis noted on the right side.  Patient does have some tenderness to palpation in the parascapular region right greater than left.  Negative Spurling's.  Osteopathic findings  C2 flexed rotated and side bent right C6 flexed rotated and side bent right T3 extended rotated and side bent right inhaled third rib T9 extended rotated and side bent left L2 flexed rotated and side bent right Sacrum right on right     Impression and Recommendations:     This case required medical decision making of moderate complexity. The above documentation has been reviewed and is accurate and complete Lyndal Pulley, DO       Note: This dictation was prepared with Dragon dictation along with smaller phrase technology. Any transcriptional errors that result from this process are unintentional.

## 2020-01-02 NOTE — Assessment & Plan Note (Signed)
Continues have tightness.  No significant concern for any underlying arthritic changes that likely are contributing.  I do believe that this is more posture, ergonomics and some mild scapular dyskinesis.  Patient should do relatively well though with conservative therapy.  Follow-up with me again 4 to 8 weeks.

## 2020-01-02 NOTE — Patient Instructions (Signed)
Vitamin D called in See me again in 4 weeks

## 2020-01-03 ENCOUNTER — Ambulatory Visit: Payer: 59 | Admitting: Family Medicine

## 2020-01-26 ENCOUNTER — Other Ambulatory Visit: Payer: Self-pay | Admitting: Family Medicine

## 2020-01-30 ENCOUNTER — Encounter: Payer: Self-pay | Admitting: Family Medicine

## 2020-01-30 ENCOUNTER — Other Ambulatory Visit: Payer: Self-pay

## 2020-01-30 ENCOUNTER — Ambulatory Visit: Payer: 59 | Admitting: Family Medicine

## 2020-01-30 VITALS — BP 114/90 | HR 91 | Ht 64.0 in | Wt 163.0 lb

## 2020-01-30 DIAGNOSIS — M999 Biomechanical lesion, unspecified: Secondary | ICD-10-CM | POA: Diagnosis not present

## 2020-01-30 DIAGNOSIS — M62838 Other muscle spasm: Secondary | ICD-10-CM | POA: Diagnosis not present

## 2020-01-30 NOTE — Assessment & Plan Note (Signed)
Neck pain overall.  Discussed which activities to do which wants to avoid.  Discussed icing regimen and home exercise, increase activity slowly.  Follow-up again in 4 to 8 weeks

## 2020-01-30 NOTE — Assessment & Plan Note (Signed)
Decision today to treat with OMT was based on Physical Exam  After verbal consent patient was treated with HVLA, ME, FPR techniques in cervical, thoracic, rib,  lumbar and sacral areas  Patient tolerated the procedure well with improvement in symptoms  Patient given exercises, stretches and lifestyle modifications  See medications in patient instructions if given  Patient will follow up in 4-8 weeks 

## 2020-01-30 NOTE — Progress Notes (Signed)
[facility] [address] [place] Phone: [phone] Subjective:   I [name] am serving as a Education administrator for Dr. Hulan Saas.  This visit occurred during the SARS-CoV-2 public health emergency.  Safety protocols were in place, including screening questions prior to the visit, additional usage of staff PPE, and extensive cleaning of exam room while observing appropriate contact time as indicated for disinfecting solutions.   I'm seeing this patient by the request  of:  Bedsole, Amy E, MD  CC: back pain follow up  [id number]  Crystal [name] is a 39 y.o. female coming in with complaint of back pain. Patient states that her pain depends on the day.  Low back pain  TTP, patient states more tightness.  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  Feels like this could be contributing some more tightness.  Mild tightness in the neck as well.  Patient's insulin seems to be doing better overall just seems to be some association with some anxiety as well.    Past Medical History:  Diagnosis Date  . Allergy   . Asthma    Past Surgical History:  Procedure Laterality Date  . HYSTEROSCOPY WITH NOVASURE N/A 05/23/2014   Procedure: Velvet Bathe;  Surgeon: Cheri Fowler, MD;  Location: [facility];  Service: Gynecology;  Laterality: N/A;  . SKIN CANCER EXCISION     Social History   Socioeconomic History  . Marital status: Married    Spouse name: Not on file  . Number of children: Not on file  . Years of education: Not on file  . Highest education level: Not on file  Occupational History  . Not on file  Tobacco Use  . Smoking status: Never Smoker  . Smokeless tobacco: Never Used  Substance and Sexual Activity  . Alcohol use: Yes  . Drug use: No  . Sexual activity: Yes  Other Topics Concern  . Not on file  Social History Narrative   Regular exercise- yes   Social Determinants of  Health   Financial Resource Strain:   . Difficulty of Paying Living Expenses: Not on file  Food Insecurity:   . Worried About Charity fundraiser in the Last Year: Not on file  . Ran Out of Food in the Last Year: Not on file  Transportation Needs:   . Lack of Transportation (Medical): Not on file  . Lack of Transportation (Non-Medical): Not on file  Physical Activity:   . Days of Exercise per Week: Not on file  . Minutes of Exercise per Session: Not on file  Stress:   . Feeling of Stress : Not on file  Social Connections:   . Frequency of Communication with Friends and Family: Not on file  . Frequency of Social Gatherings with Friends and Family: Not on file  . Attends Religious Services: Not on file  . Active Member of Clubs or Organizations: Not on file  . Attends Archivist Meetings: Not on file  . Marital Status: Not on file   Allergies  Allergen Reactions  . Dextromethorphan Hbr Rash   Family History  Problem Relation Age of Onset  . Fibromyalgia Father   . Autoimmune disease Father   . Cancer Maternal Grandmother        stomach  . Diabetes Maternal Grandmother   . Diabetes Paternal Grandmother   . Heart disease Paternal Grandmother   .  Alcohol abuse Paternal Grandfather   . Cancer Paternal Grandfather        lung    Current Outpatient Medications (Endocrine & Metabolic):  .  predniSONE (DELTASONE) 50 MG tablet, Take one tablet daily for the next 5 days.   Current Outpatient Medications (Respiratory):  .  albuterol (PROVENTIL HFA;VENTOLIN HFA) 108 (90 Base) MCG/ACT inhaler, Inhale 1-2 puffs into the lungs every 6 (six) hours as needed for wheezing or shortness of breath.    Current Outpatient Medications (Other):  .  amphetamine-dextroamphetamine (ADDERALL) 15 MG tablet, Take 15 mg by mouth daily. Marland Kitchen  doxycycline (VIBRA-TABS) 100 MG tablet, Take 1 tablet (100 mg total) by mouth 2 (two) times daily. Marland Kitchen  gabapentin (NEURONTIN) 100 MG capsule, TAKE 2  CAPSULES (200 MG TOTAL) BY MOUTH AT BEDTIME. Marland Kitchen  Prenatal Vit-Fe Fumarate-FA (PRENATAL MULTIVITAMIN) TABS tablet, Take 1 tablet by mouth daily at 12 noon. .  venlafaxine XR (EFFEXOR-XR) 37.5 MG 24 hr capsule, TAKE 1 CAPSULE (37.5 MG TOTAL) BY MOUTH DAILY WITH BREAKFAST. Marland Kitchen  Vitamin D, Ergocalciferol, (DRISDOL) 1.25 MG (50000 UT) CAPS capsule, TAKE 1 CAPSULE BY MOUTH EVERY 7 DAYS .  Vitamin D, Ergocalciferol, (DRISDOL) 1.25 MG (50000 UT) CAPS capsule, Take 1 capsule (50,000 Units total) by mouth every 7 (seven) days.   Reviewed prior external information including notes and imaging from  primary care provider As well as notes that were available from care everywhere and other healthcare systems.  Past medical history, social, surgical and family history all reviewed in electronic medical record.  No pertanent information unless stated regarding to the chief complaint.   Review of Systems:  No headache, visual changes, nausea, vomiting, diarrhea, constipation, dizziness, abdominal pain, skin rash, fevers, chills, night sweats, weight loss, swollen lymph nodes, body aches, joint swelling, chest pain, shortness of breath, mood changes. POSITIVE muscle aches  Objective  Blood pressure 114/90, pulse 91, height 5\' 4"  (1.626 m), weight 163 lb (73.9 kg), SpO2 98 %, unknown if currently breastfeeding.   General: No apparent distress alert and oriented x3 mood and affect normal, dressed appropriately.  HEENT: Pupils equal, extraocular movements intact  Respiratory: Patient's speak in full sentences and does not appear short of breath  Cardiovascular: No lower extremity edema, non tender, no erythema  Skin: Warm dry intact with no signs of infection or rash on extremities or on axial skeleton.  Abdomen: Soft nontender  Neuro: Cranial nerves II through XII are intact, neurovascularly intact in all extremities with 2+ DTRs and 2+ pulses.  Lymph: No lymphadenopathy of posterior or anterior cervical chain or  axillae bilaterally.  Gait normal with good balance and coordination.  MSK:  Non tender with full range of motion and good stability and symmetric strength and tone of shoulders, elbows, wrist, hip, knee and ankles bilaterally.  Neck exam does have some loss of lordosis.  Mild tightness noted in the parascapular region right greater than left.  Patient has some very mild scapular dyskinesis of the inferior angle on the right side only.  5 out of 5 strength in the upper extremities.  Low back exam does have some tightness noted.  Tender to palpation but good range of motion.  Mild positive Corky Sox on the right  Osteopathic findings C4 flexed rotated and side bent left C7 flexed rotated and side bent left T3 extended rotated and side bent right inhaled third rib T5 extended rotated and side bent left L4 flexed rotated and side bent left  Sacrum right on right  Impression and Recommendations:     This case required medical decision making of moderate complexity. The above documentation has been reviewed and is accurate and complete Lyndal Pulley, DO       Note: This dictation was prepared with Dragon dictation along with smaller phrase technology. Any transcriptional errors that result from this process are unintentional.

## 2020-01-30 NOTE — Patient Instructions (Addendum)
Good to see you Always close! See me again in 4 weeks

## 2020-02-26 ENCOUNTER — Other Ambulatory Visit: Payer: Self-pay | Admitting: Family Medicine

## 2020-02-27 ENCOUNTER — Ambulatory Visit: Payer: 59 | Admitting: Family Medicine

## 2020-02-28 ENCOUNTER — Ambulatory Visit: Payer: 59 | Admitting: Family Medicine

## 2020-02-28 NOTE — Progress Notes (Deleted)
[place] [name] Phone: [phone] Subjective:    I'm seeing this patient by the request  of:  Crystal Sanders, MD  CC:   [id number]  Crystal [name] is a 39 y.o. female coming in with complaint of back pain. Last seen on 01/30/2020.      Past Medical History:  Diagnosis Date  . Allergy   . Asthma    Past Surgical History:  Procedure Laterality Date  . HYSTEROSCOPY WITH NOVASURE N/A 05/23/2014   Procedure: Velvet Bathe;  Surgeon: Cheri Fowler, MD;  Location: [facility];  Service: Gynecology;  Laterality: N/A;  . SKIN CANCER EXCISION     Social History   Socioeconomic History  . Marital status: Married    Spouse name: Not on file  . Number of children: Not on file  . Years of education: Not on file  . Highest education level: Not on file  Occupational History  . Not on file  Tobacco Use  . Smoking status: Never Smoker  . Smokeless tobacco: Never Used  Substance and Sexual Activity  . Alcohol use: Yes  . Drug use: No  . Sexual activity: Yes  Other Topics Concern  . Not on file  Social History Narrative   Regular exercise- yes   Social Determinants of Health   Financial Resource Strain:   . Difficulty of Paying Living Expenses: Not on file  Food Insecurity:   . Worried About Charity fundraiser in the Last Year: Not on file  . Ran Out of Food in the Last Year: Not on file  Transportation Needs:   . Lack of Transportation (Medical): Not on file  . Lack of Transportation (Non-Medical): Not on file  Physical Activity:   . Days of Exercise per Week: Not on file  . Minutes of Exercise per Session: Not on file  Stress:   . Feeling of Stress : Not on file  Social Connections:   . Frequency of Communication with Friends and Family: Not on file  . Frequency of Social Gatherings with Friends and Family: Not on file  . Attends Religious Services: Not on file  . Active Member of Clubs or Organizations:  Not on file  . Attends Archivist Meetings: Not on file  . Marital Status: Not on file   Allergies  Allergen Reactions  . Dextromethorphan Hbr Rash   Family History  Problem Relation Age of Onset  . Fibromyalgia Father   . Autoimmune disease Father   . Cancer Maternal Grandmother        stomach  . Diabetes Maternal Grandmother   . Diabetes Paternal Grandmother   . Heart disease Paternal Grandmother   . Alcohol abuse Paternal Grandfather   . Cancer Paternal Grandfather        lung    Current Outpatient Medications (Endocrine & Metabolic):  .  predniSONE (DELTASONE) 50 MG tablet, Take one tablet daily for the next 5 days.   Current Outpatient Medications (Respiratory):  .  albuterol (PROVENTIL HFA;VENTOLIN HFA) 108 (90 Base) MCG/ACT inhaler, Inhale 1-2 puffs into the lungs every 6 (six) hours as needed for wheezing or shortness of breath.    Current Outpatient Medications (Other):  .  amphetamine-dextroamphetamine (ADDERALL) 15 MG tablet, Take 15 mg by mouth daily. Marland Kitchen  doxycycline (VIBRA-TABS) 100 MG tablet, Take 1 tablet (100 mg total) by mouth 2 (two) times daily. Marland Kitchen  gabapentin (NEURONTIN) 100 MG capsule, TAKE 2 CAPSULES (  200 MG TOTAL) BY MOUTH AT BEDTIME. Marland Kitchen  Prenatal Vit-Fe Fumarate-FA (PRENATAL MULTIVITAMIN) TABS tablet, Take 1 tablet by mouth daily at 12 noon. .  venlafaxine XR (EFFEXOR-XR) 37.5 MG 24 hr capsule, TAKE 1 CAPSULE (37.5 MG TOTAL) BY MOUTH DAILY WITH BREAKFAST. Marland Kitchen  Vitamin D, Ergocalciferol, (DRISDOL) 1.25 MG (50000 UT) CAPS capsule, TAKE 1 CAPSULE BY MOUTH EVERY 7 DAYS .  Vitamin D, Ergocalciferol, (DRISDOL) 1.25 MG (50000 UT) CAPS capsule, Take 1 capsule (50,000 Units total) by mouth every 7 (seven) days.   Reviewed prior external information including notes and imaging from  primary care provider As well as notes that were available from care everywhere and other healthcare systems.  Past medical history, social, surgical and family history all  reviewed in electronic medical record.  No pertanent information unless stated regarding to the chief complaint.   Review of Systems:  No headache, visual changes, nausea, vomiting, diarrhea, constipation, dizziness, abdominal pain, skin rash, fevers, chills, night sweats, weight loss, swollen lymph nodes, body aches, joint swelling, chest pain, shortness of breath, mood changes. POSITIVE muscle aches  Objective  unknown if currently breastfeeding.   General: No apparent distress alert and oriented x3 mood and affect normal, dressed appropriately.  HEENT: Pupils equal, extraocular movements intact  Respiratory: Patient's speak in full sentences and does not appear short of breath  Cardiovascular: No lower extremity edema, non tender, no erythema  Skin: Warm dry intact with no signs of infection or rash on extremities or on axial skeleton.  Abdomen: Soft nontender  Neuro: Cranial nerves II through XII are intact, neurovascularly intact in all extremities with 2+ DTRs and 2+ pulses.  Lymph: No lymphadenopathy of posterior or anterior cervical chain or axillae bilaterally.  Gait normal with good balance and coordination.  MSK:  Non tender with full range of motion and good stability and symmetric strength and tone of shoulders, elbows, wrist, hip, knee and ankles bilaterally.     Impression and Recommendations:     This case required medical decision making of moderate complexity. The above documentation has been reviewed and is accurate and complete Jacqualin Combes       Note: This dictation was prepared with Dragon dictation along with smaller phrase technology. Any transcriptional errors that result from this process are unintentional.

## 2020-03-22 ENCOUNTER — Other Ambulatory Visit: Payer: Self-pay | Admitting: Family Medicine

## 2020-03-23 MED FILL — AMPHETAMINE-DEXTROAMPHETAMI: 5 | 30 days supply | Qty: 30 | Fill #0

## 2020-03-23 MED FILL — AMPHETAMINE-DEXTROAMPHETAMI: 20 | 30 days supply | Qty: 30 | Fill #0

## 2020-04-23 ENCOUNTER — Other Ambulatory Visit: Payer: Self-pay

## 2020-04-23 ENCOUNTER — Encounter: Payer: Self-pay | Admitting: Family Medicine

## 2020-04-23 ENCOUNTER — Ambulatory Visit: Payer: 59 | Admitting: Family Medicine

## 2020-04-23 VITALS — BP 110/72 | HR 117 | Ht 64.0 in | Wt 172.0 lb

## 2020-04-23 DIAGNOSIS — M62838 Other muscle spasm: Secondary | ICD-10-CM

## 2020-04-23 DIAGNOSIS — M999 Biomechanical lesion, unspecified: Secondary | ICD-10-CM

## 2020-04-23 MED ORDER — VILAZODONE HCL 10 MG PO TABS: 10.0000 mg | ORAL_TABLET | Freq: Every day | ORAL | 1 refills | Status: DC

## 2020-04-23 NOTE — Progress Notes (Signed)
[place] [address] [place] Phone: [phone] Subjective:   I [name] am serving as a Education administrator for Dr. Hulan Saas.  This visit occurred during the SARS-CoV-2 public health emergency.  Safety protocols were in place, including screening questions prior to the visit, additional usage of staff PPE, and extensive cleaning of exam room while observing appropriate contact time as indicated for disinfecting solutions.   I'm seeing this patient by the request  of:  Bedsole, Amy E, MD  CC: Upper back and neck pain  [id number]  Crystal [name] is a 39 y.o. female coming in with complaint of back pain. Last seen on 01/30/2020 for OMT. Patient states scapular region is painful. Patient states that has been acting and working in more difficult positions and not working at her desk as much.  Tightness noted.  Patient states symptoms feels like she has an irregular heart rate.  Wanting to know if that is potentially secondary to the medications.     Past Medical History:  Diagnosis Date  . Allergy   . Asthma    Past Surgical History:  Procedure Laterality Date  . HYSTEROSCOPY WITH NOVASURE N/A 05/23/2014   Procedure: Velvet Bathe;  Surgeon: Cheri Fowler, MD;  Location: [facility];  Service: Gynecology;  Laterality: N/A;  . SKIN CANCER EXCISION     Social History   Socioeconomic History  . Marital status: Married    Spouse name: Not on file  . Number of children: Not on file  . Years of education: Not on file  . Highest education level: Not on file  Occupational History  . Not on file  Tobacco Use  . Smoking status: Never Smoker  . Smokeless tobacco: Never Used  Substance and Sexual Activity  . Alcohol use: Yes  . Drug use: No  . Sexual activity: Yes  Other Topics Concern  . Not on file  Social History Narrative   Regular exercise- yes   Social Determinants of Health   Financial Resource Strain:   . Difficulty of Paying  Living Expenses:   Food Insecurity:   . Worried About Charity fundraiser in the Last Year:   . Arboriculturist in the Last Year:   Transportation Needs:   . Film/video editor (Medical):   Marland Kitchen Lack of Transportation (Non-Medical):   Physical Activity:   . Days of Exercise per Week:   . Minutes of Exercise per Session:   Stress:   . Feeling of Stress :   Social Connections:   . Frequency of Communication with Friends and Family:   . Frequency of Social Gatherings with Friends and Family:   . Attends Religious Services:   . Active Member of Clubs or Organizations:   . Attends Archivist Meetings:   Marland Kitchen Marital Status:    Allergies  Allergen Reactions  . Dextromethorphan Hbr Rash   Family History  Problem Relation Age of Onset  . Fibromyalgia Father   . Autoimmune disease Father   . Cancer Maternal Grandmother        stomach  . Diabetes Maternal Grandmother   . Diabetes Paternal Grandmother   . Heart disease Paternal Grandmother   . Alcohol abuse Paternal Grandfather   . Cancer Paternal Grandfather        lung    Current Outpatient Medications (Endocrine & Metabolic):  .  predniSONE (DELTASONE) 50 MG tablet, Take one tablet daily for the next 5  days.   Current Outpatient Medications (Respiratory):  .  albuterol (PROVENTIL HFA;VENTOLIN HFA) 108 (90 Base) MCG/ACT inhaler, Inhale 1-2 puffs into the lungs every 6 (six) hours as needed for wheezing or shortness of breath.    Current Outpatient Medications (Other):  .  amphetamine-dextroamphetamine (ADDERALL) 15 MG tablet, Take 15 mg by mouth daily. Marland Kitchen  doxycycline (VIBRA-TABS) 100 MG tablet, Take 1 tablet (100 mg total) by mouth 2 (two) times daily. Marland Kitchen  gabapentin (NEURONTIN) 100 MG capsule, TAKE 2 CAPSULES (200 MG TOTAL) BY MOUTH AT BEDTIME. Marland Kitchen  Prenatal Vit-Fe Fumarate-FA (PRENATAL MULTIVITAMIN) TABS tablet, Take 1 tablet by mouth daily at 12 noon. .  venlafaxine XR (EFFEXOR-XR) 37.5 MG 24 hr capsule, TAKE 1  CAPSULE (37.5 MG TOTAL) BY MOUTH DAILY WITH BREAKFAST. Marland Kitchen  Vitamin D, Ergocalciferol, (DRISDOL) 1.25 MG (50000 UNIT) CAPS capsule, TAKE 1 CAPSULE (50,000 UNITS TOTAL) BY MOUTH EVERY 7 (SEVEN) DAYS. Marland Kitchen  Vitamin D, Ergocalciferol, (DRISDOL) 1.25 MG (50000 UT) CAPS capsule, TAKE 1 CAPSULE BY MOUTH EVERY 7 DAYS .  Vilazodone HCl (VIIBRYD) 10 MG TABS, Take 1 tablet (10 mg total) by mouth daily.   Reviewed prior external information including notes and imaging from  primary care provider As well as notes that were available from care everywhere and other healthcare systems.  Past medical history, social, surgical and family history all reviewed in electronic medical record.  No pertanent information unless stated regarding to the chief complaint.   Review of Systems:  No headache, visual changes, nausea, vomiting, diarrhea, constipation, dizziness, abdominal pain, skin rash, fevers, chills, night sweats, weight loss, swollen lymph nodes, body aches, joint swelling, chest pain, shortness of breath,. POSITIVE muscle aches mild increase in anxiety  Objective  Blood pressure 110/72, pulse (!) 117, height 5\' 4"  (1.626 m), weight 172 lb (78 kg), SpO2 98 %, unknown if currently breastfeeding.   General: Does seem more stress than previous. HEENT: Pupils equal, extraocular movements intact  Respiratory: Patient's speak in full sentences and does not appear short of breath  Cardiovascular: No lower extremity edema, non tender, no erythema  Neuro: Cranial nerves II through XII are intact, neurovascularly intact in all extremities with 2+ DTRs and 2+ pulses.  Gait normal with good balance and coordination.  MSK:  Non tender with full range of motion and good stability and symmetric strength and tone of  elbows, wrist, hip, knee and ankles bilaterally.  Patient does have some tightness in the right scapular region noted.  Tightness with mild spasm of the right side of the paraspinal musculature of the neck.   Lacks last 5 degrees of extension.  Patient has negative Spurling's.  Low back some mild tenderness over the sacroiliac joint bilaterally.  Osteopathic findings C2 flexed rotated and side bent right C4 flexed rotated and side bent left C6 flexed rotated and side bent left T3 extended rotated and side bent right inhaled third rib T9 extended rotated and side bent left L2 flexed rotated and side bent right Sacrum right on right    Impression and Recommendations:     This case required medical decision making of moderate complexity. The above documentation has been reviewed and is accurate and complete Lyndal Pulley, DO       Note: This dictation was prepared with Dragon dictation along with smaller phrase technology. Any transcriptional errors that result from this process are unintentional.

## 2020-04-23 NOTE — Patient Instructions (Addendum)
Good to see you  vibryd 10 mg daily with the effexor goal Is to get off effexor next visit and go to 20 mg on this maybe.  Keep active.  Watch for any other triggers  Posture is key  See me again in 5 weeks

## 2020-04-23 NOTE — Assessment & Plan Note (Signed)

## 2020-04-23 NOTE — Assessment & Plan Note (Signed)
More of a spasm again.  Discussed with patient about this chronic problem.  Does have some underlying anxiety that I think contributes.  Started on Viibryd and will see if this will be more beneficial.  Discussed icing regimen and home exercises.  Discussed avoiding repetitive activity and we discussed ergonomics.  Discussed the gabapentin.  Follow-up with me again 5 weeks where we will hopefully adjust medications again.

## 2020-04-29 MED FILL — AMPHETAMINE-DEXTROAMPHETAMI: 5 | 15 days supply | Qty: 30 | Fill #0

## 2020-04-29 MED FILL — AMPHETAMINE-DEXTROAMPHETAMI: 20 | 30 days supply | Qty: 30 | Fill #0

## 2020-05-28 ENCOUNTER — Ambulatory Visit: Payer: 59 | Admitting: Family Medicine

## 2020-05-28 NOTE — Progress Notes (Deleted)
°  [place] Phone: [phone] Subjective:    I'm seeing this patient by the request  of:  Jinny Sanders, MD  CC:   [id number]  Crystal [name] is a 39 y.o. female coming in with complaint of back and neck pain. OMT 04/23/2020. Patient states   Medications patient has been prescribed:   Taking:         Reviewed prior external information including notes and imaging from previsou exam, outside providers and external EMR if available.   As well as notes that were available from care everywhere and other healthcare systems.  Past medical history, social, surgical and family history all reviewed in electronic medical record.  No pertanent information unless stated regarding to the chief complaint.   Past Medical History:  Diagnosis Date   Allergy    Asthma     Allergies  Allergen Reactions   Dextromethorphan Hbr Rash     Review of Systems:  No headache, visual changes, nausea, vomiting, diarrhea, constipation, dizziness, abdominal pain, skin rash, fevers, chills, night sweats, weight loss, swollen lymph nodes, body aches, joint swelling, chest pain, shortness of breath, mood changes. POSITIVE muscle aches  Objective  unknown if currently breastfeeding.   General: No apparent distress alert and oriented x3 mood and affect normal, dressed appropriately.  HEENT: Pupils equal, extraocular movements intact  Respiratory: Patient's speak in full sentences and does not appear short of breath  Cardiovascular: No lower extremity edema, non tender, no erythema  Neuro: Cranial nerves II through XII are intact, neurovascularly intact in all extremities with 2+ DTRs and 2+ pulses.  Gait normal with good balance and coordination.  MSK:  Non tender with full range of motion and good stability and symmetric strength and tone of shoulders, elbows, wrist, hip, knee and ankles bilaterally.  Back - Normal skin,  Spine with normal alignment and no deformity.  No tenderness to vertebral process palpation.  Paraspinous muscles are not tender and without spasm.   Range of motion is full at neck and lumbar sacral regions  Osteopathic findings  C2 flexed rotated and side bent right C6 flexed rotated and side bent left T3 extended rotated and side bent right inhaled rib T9 extended rotated and side bent left L2 flexed rotated and side bent right Sacrum right on right       Assessment and Plan:    Nonallopathic problems  Decision today to treat with OMT was based on Physical Exam  After verbal consent patient was treated with HVLA, ME, FPR techniques in cervical, rib, thoracic, lumbar, and sacral  areas  Patient tolerated the procedure well with improvement in symptoms  Patient given exercises, stretches and lifestyle modifications  See medications in patient instructions if given  Patient will follow up in 4-8 weeks      The above documentation has been reviewed and is accurate and complete Jacqualin Combes       Note: This dictation was prepared with Dragon dictation along with smaller phrase technology. Any transcriptional errors that result from this process are unintentional.

## 2020-05-30 ENCOUNTER — Other Ambulatory Visit: Payer: Self-pay | Admitting: Family Medicine

## 2020-06-09 ENCOUNTER — Other Ambulatory Visit: Payer: Self-pay | Admitting: Family Medicine

## 2020-06-27 ENCOUNTER — Other Ambulatory Visit: Payer: Self-pay | Admitting: Family Medicine

## 2020-06-30 ENCOUNTER — Other Ambulatory Visit: Payer: Self-pay

## 2020-06-30 MED ORDER — PREDNISONE 50 MG PO TABS: ORAL_TABLET | ORAL | 0 refills | Status: DC

## 2020-08-19 ENCOUNTER — Other Ambulatory Visit: Payer: Self-pay

## 2020-08-19 ENCOUNTER — Ambulatory Visit (INDEPENDENT_AMBULATORY_CARE_PROVIDER_SITE_OTHER): Payer: 59 | Admitting: Family Medicine

## 2020-08-19 ENCOUNTER — Encounter: Payer: Self-pay | Admitting: Family Medicine

## 2020-08-19 VITALS — BP 118/88 | HR 107 | Ht 64.0 in | Wt 172.0 lb

## 2020-08-19 DIAGNOSIS — M62830 Muscle spasm of back: Secondary | ICD-10-CM

## 2020-08-19 DIAGNOSIS — M999 Biomechanical lesion, unspecified: Secondary | ICD-10-CM

## 2020-08-19 MED ORDER — VENLAFAXINE HCL ER 75 MG PO CP24: 75.0000 mg | ORAL_CAPSULE | Freq: Every day | ORAL | 0 refills | Status: DC

## 2020-08-19 NOTE — Progress Notes (Signed)
[place] Phone: [phone] Subjective:   Crystal [name], am serving as a scribe for Dr. Hulan Saas. This visit occurred during the SARS-CoV-2 public health emergency.  Safety protocols were in place, including screening questions prior to the visit, additional usage of staff PPE, and extensive cleaning of exam room while observing appropriate contact time as indicated for disinfecting solutions.   I'm seeing this patient by the request  of:  Bedsole, Amy E, MD  CC: Back pain follow-up neck pain follow-up  [id number]  Crystal [name] is a 39 y.o. female coming in with complaint of back and neck pain. OMT 04/23/2020. Patient states that she is having pain in right hip.  Patient states it has been tighter recently.  Has been greater than 4 months since we have seen her.  Has had some increase in anxiety as well.  Feels like the Viibryd did not seem to do any significant improvement.  Medications patient has been prescribed: Effexor  Taking: Yes        Reviewed prior external information including notes and imaging from previsou exam, outside providers and external EMR if available.   As well as notes that were available from care everywhere and other healthcare systems.  Past medical history, social, surgical and family history all reviewed in electronic medical record.  [name] pertanent information unless stated regarding to the chief complaint.   Past Medical History:  Diagnosis Date  . Allergy   . Asthma     Allergies  Allergen Reactions  . Dextromethorphan Hbr Rash     Review of Systems:  [name] headache, visual changes, nausea, vomiting, diarrhea, constipation, dizziness, abdominal pain, skin rash, fevers, chills, night sweats, weight loss, swollen lymph nodes, , joint swelling, chest pain, shortness of breath, mood changes. POSITIVE muscle aches, body aches  Objective  Blood pressure 118/88, pulse  (!) 107, height 5\' 4"  (1.626 m), weight 172 lb (78 kg), SpO2 98 %, unknown if currently breastfeeding.   General: [name] apparent distress alert and oriented x3 mood and affect normal, dressed appropriately.  HEENT: Pupils equal, extraocular movements intact  Respiratory: Patient's speak in full sentences and does not appear short of breath  Cardiovascular: [name] lower extremity edema, non tender, [name] erythema  Neuro: Cranial nerves II through XII are intact, neurovascularly intact in all extremities with 2+ DTRs and 2+ pulses.  Gait normal with good balance and coordination.  MSK:  Non tender with full range of motion and good stability and symmetric strength and tone of shoulders, elbows, wrist, hip, knee and ankles bilaterally.  Back -   Osteopathic findings  C2 flexed rotated and side bent right C7 flexed rotated and side bent left T3 extended rotated and side bent right inhaled rib T9 extended rotated and side bent left L2 flexed rotated and side bent right Sacrum right on right       Assessment and Plan: Back spasm Continue intermittent spasming of the back noted.  Discussed with patient about icing regimen and home exercise, which activities to do which wants to avoid.  Effexor increased to 75 mg to help with the pain as well as some of the underlying anxiety.  Patient will increase activity slowly.  Follow-up again 6 weeks     Nonallopathic problems  Decision today to treat with OMT was based on Physical Exam  After verbal consent patient was treated with HVLA, ME, FPR techniques in cervical, rib, thoracic, lumbar,  and sacral  areas  Patient tolerated the procedure well with improvement in symptoms  Patient given exercises, stretches and lifestyle modifications  See medications in patient instructions if given  Patient will follow up in 4-8 weeks      The above documentation has been reviewed and is accurate and complete Lyndal Pulley, DO       Note: This  dictation was prepared with Dragon dictation along with smaller phrase technology. Any transcriptional errors that result from this process are unintentional.

## 2020-08-19 NOTE — Assessment & Plan Note (Signed)
Continue intermittent spasming of the back noted.  Discussed with patient about icing regimen and home exercise, which activities to do which wants to avoid.  Effexor increased to 75 mg to help with the pain as well as some of the underlying anxiety.  Patient will increase activity slowly.  Follow-up again 6 weeks

## 2020-08-19 NOTE — Patient Instructions (Signed)
Effexor 75mg  I'm here when you need me  See me in 7-8 weeks

## 2020-08-27 ENCOUNTER — Other Ambulatory Visit: Payer: Self-pay | Admitting: Family Medicine

## 2020-09-12 ENCOUNTER — Other Ambulatory Visit: Payer: Self-pay | Admitting: Family Medicine

## 2020-09-23 ENCOUNTER — Other Ambulatory Visit: Payer: Self-pay | Admitting: Family Medicine

## 2020-10-07 ENCOUNTER — Ambulatory Visit (INDEPENDENT_AMBULATORY_CARE_PROVIDER_SITE_OTHER): Payer: 59 | Admitting: Family Medicine

## 2020-10-07 ENCOUNTER — Encounter: Payer: Self-pay | Admitting: Family Medicine

## 2020-10-07 DIAGNOSIS — Z5329 Procedure and treatment not carried out because of patient's decision for other reasons: Secondary | ICD-10-CM

## 2020-10-07 NOTE — Progress Notes (Signed)
No show

## 2020-10-13 NOTE — Progress Notes (Signed)
[place] Phone: [phone] Subjective:   Crystal Crystal [name], am serving as a scribe for Dr. Hulan Saas. This visit occurred during the SARS-CoV-2 public health emergency.  Safety protocols were in place, including screening questions prior to the visit, additional usage of staff PPE, and extensive cleaning of exam room while observing appropriate contact time as indicated for disinfecting solutions.   I'm seeing this patient by the request  of:  Bedsole, Amy E, MD  CC: Neck and low back pain follow-up  [id number]  Crystal Crystal [name] is a 39 y.o. female coming in with complaint of back and neck pain. OMT 08/19/2020. Patient states that her pain is localized to the lower back on right side. Using Advil for pain. Also having neck pain and tingling in both hands with lying down. Pain mostly on right side of neck. Feels like fire in that area.   Medications patient has been prescribed: Gabapentin, Viibryd          Reviewed prior external information including notes and imaging from previsou exam, outside providers and external EMR if available.   As well as notes that were available from care everywhere and other healthcare systems.  Past medical history, social, surgical and family history all reviewed in electronic medical record.  Crystal [name] pertanent information unless stated regarding to the chief complaint.   Past Medical History:  Diagnosis Date  . Allergy   . Asthma     Allergies  Allergen Reactions  . Dextromethorphan Hbr Rash     Review of Systems:  Crystal [name] headache, visual changes, nausea, vomiting, diarrhea, constipation, dizziness, abdominal pain, skin rash, fevers, chills, night sweats, weight loss, swollen lymph nodes, body aches, joint swelling, chest pain, shortness of breath, mood changes. POSITIVE muscle aches  Objective  Blood pressure 120/82, pulse (!) 105, height 5\' 4"  (1.626 m), weight 182 lb  (82.6 kg), SpO2 98 %, unknown if currently breastfeeding.   General: Crystal [name] apparent distress alert and oriented x3 mood and affect normal, dressed appropriately.  HEENT: Pupils equal, extraocular movements intact  Respiratory: Patient's speak in full sentences and does not appear short of breath  Cardiovascular: Crystal [name] lower extremity edema, non tender, Crystal [name] erythema  Gait normal with good balance and coordination.  MSK:  Non tender with full range of motion and good stability and symmetric strength and tone of shoulders, elbows, wrist, hip, knee and ankles bilaterally.  Neck exam shows some loss of lordosis.  Some tenderness to palpation in the paraspinal musculature lumbar spine right greater than left.  Patient has a negative Spurling's.  Low back exam mild tightness noted in the paraspinal musculature.  Positive Faber right greater than left.  Negative straight leg test.  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  Discussed icing regimen and home exercise, discussed which activities he returns to avoid increase activity slowly.  Medications discussed include gabapentin.  Follow-up again in 8 weeks    Nonallopathic problems  Decision today to treat with OMT was based on Physical Exam  After verbal consent patient was treated with HVLA, ME, FPR techniques  in cervical, rib, thoracic, lumbar, and sacral  areas  Patient tolerated the procedure well with improvement in symptoms  Patient given exercises, stretches and lifestyle modifications  See medications in patient instructions if given  Patient will follow up in 4-8 weeks      The above  documentation has been reviewed and is accurate and complete Lyndal Pulley, DO       Note: This dictation was prepared with Dragon dictation along with smaller phrase technology. Any transcriptional errors that result from this process are unintentional.

## 2020-10-14 ENCOUNTER — Other Ambulatory Visit: Payer: Self-pay

## 2020-10-14 ENCOUNTER — Ambulatory Visit: Payer: 59 | Admitting: Family Medicine

## 2020-10-14 ENCOUNTER — Encounter: Payer: Self-pay | Admitting: Family Medicine

## 2020-10-14 VITALS — BP 120/82 | HR 105 | Ht 64.0 in | Wt 182.0 lb

## 2020-10-14 DIAGNOSIS — M999 Biomechanical lesion, unspecified: Secondary | ICD-10-CM

## 2020-10-14 DIAGNOSIS — M62838 Other muscle spasm: Secondary | ICD-10-CM

## 2020-10-14 NOTE — Patient Instructions (Addendum)
Exercises See me back in 6-8 weeks

## 2020-10-14 NOTE — Assessment & Plan Note (Signed)
Chronic problem with mild exacerbation.  Discussed icing regimen and home exercise, discussed which activities he returns to avoid increase activity slowly.  Medications discussed include gabapentin.  Follow-up again in 8 weeks

## 2020-10-15 ENCOUNTER — Other Ambulatory Visit: Payer: Self-pay | Admitting: Family Medicine

## 2020-11-01 ENCOUNTER — Ambulatory Visit
Admission: EM | Admit: 2020-11-01 | Discharge: 2020-11-01 | Disposition: A | Discharge status: Home / Self Care | Payer: 59 | Attending: Physician Assistant | Admitting: Physician Assistant

## 2020-11-01 ENCOUNTER — Other Ambulatory Visit: Payer: Self-pay

## 2020-11-01 DIAGNOSIS — Z20822 Contact with and (suspected) exposure to covid-19: Secondary | ICD-10-CM

## 2020-11-01 DIAGNOSIS — J0140 Acute pansinusitis, unspecified: Secondary | ICD-10-CM

## 2020-11-01 MED ORDER — DOXYCYCLINE HYCLATE 100 MG PO CAPS: 100.0000 mg | ORAL_CAPSULE | Freq: Two times a day (BID) | ORAL | 0 refills | Status: DC

## 2020-11-01 MED ORDER — PREDNISONE 50 MG PO TABS: 50.0000 mg | ORAL_TABLET | Freq: Every day | ORAL | 0 refills | Status: DC

## 2020-11-01 MED ORDER — IPRATROPIUM BROMIDE 0.06 % NA SOLN: 2.0000 | Freq: Four times a day (QID) | NASAL | 0 refills | Status: AC

## 2020-11-01 NOTE — ED Triage Notes (Signed)
Patient presents to Urgent Care with complaints of sore throat, productive cough, nasal congestion, headache, and bilateral ear pain x 7-10 ago. Reports diarrhea x 3 and last temp 99.8. Has been taken Mucinex with no relief.   Denies SOB, N/V.

## 2020-11-01 NOTE — Discharge Instructions (Addendum)
COVID PCR testing ordered. I would like you to quarantine until testing results. Start doxycycline as directed. Atrovent for nasal drainage. You can use over the counter nasal saline rinse such as neti pot for nasal congestion. Keep hydrated, your urine should be clear to pale yellow in color. Tylenol/motrin for fever and pain. Monitor for any worsening of symptoms, chest pain, shortness of breath, wheezing, swelling of the throat, go to the emergency department for further evaluation needed.   If having continued sinus pressure or worsening wheezing, can start prednisone after being on doxycycline for 2-3 days.

## 2020-11-01 NOTE — ED Provider Notes (Signed)
[facility]    CSN: [id number] Arrival date & time: 11/01/20  1201      History   Chief Complaint Chief Complaint  Patient presents with  . Nasal Congestion  . Headache  . Sore Throat    HPI Crystal [name] is a 39 y.o. female.   39 year old female comes in for 10 day history of URI symptoms. Sore throat, productive cough, nasal congestion, headache, bilateral ear pain. tmax 101, has since resolved. Dyspnea on exertion with relief on albuterol. Diarrhea for the past few days.      Past Medical History:  Diagnosis Date  . Allergy   . Asthma     Patient Active Problem List   Diagnosis Date Noted  . Nonallopathic lesion of rib cage 12/06/2019  . Nonallopathic lesion of cervical region 03/06/2019  . Cervical paraspinal muscle spasm 02/13/2019  . 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 Gestational diabetes mellitus (GDM) controlled on oral hypoglycemic drug 10/03/2017  . Abnormal fetal heart rate 09/12/2017  . Trigger point of right shoulder region 11/25/2016  . Concussion with loss of consciousness 11/16/2016  . Whiplash 11/16/2016  . Hamstring tendinitis at origin 06/02/2016  . 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  . Lymphadenopathy 11/10/2015  . Posterior interosseous nerve pain 09/11/2015  . Generalized anxiety disorder 11/28/2014  . Back spasm 10/10/2014  . Nonallopathic lesion of lumbosacral region 10/10/2014  . Nonallopathic lesion of sacral region 10/10/2014  . Nonallopathic lesion of thoracic region 10/10/2014  . Strain of left quadriceps muscle 09/05/2014  . Chest pain 09/05/2014  . Stress incontinence in female 05/23/2014  . CARPAL TUNNEL SYNDROME, LEFT 09/14/2010  . GANGLION CYST, WRIST, LEFT 09/14/2010  . MIGRAINE, COMMON 05/27/2008  . ALLERGIC RHINITIS 05/27/2008  . DIABETES MELLITUS, GESTATIONAL, HX OF 05/27/2008  . MALIGNANT MELANOMA SKIN LOWER LIMB INCLUDING HIP 12/27/2003    Past Surgical History:  Procedure Laterality Date    . HYSTEROSCOPY WITH NOVASURE N/A 05/23/2014   Procedure: Velvet Bathe;  Surgeon: Cheri Fowler, MD;  Location: [facility];  Service: Gynecology;  Laterality: N/A;  . SKIN CANCER EXCISION      OB History    Gravida  5   Para  4   Term  4   Preterm      AB      Living  4     SAB      TAB      Ectopic      Multiple  0   Live Births  4            Home Medications    Prior to Admission medications   Medication Sig Start Date End Date Taking? Authorizing Provider  albuterol (PROVENTIL HFA;VENTOLIN HFA) 108 (90 Base) MCG/ACT inhaler Inhale 1-2 puffs into the lungs every 6 (six) hours as needed for wheezing or shortness of breath.    [provider]  amphetamine-dextroamphetamine (ADDERALL) 15 MG tablet Take 15 mg by mouth daily.    [provider]  doxycycline (VIBRAMYCIN) 100 MG capsule Take 1 capsule (100 mg total) by mouth 2 (two) times daily. 11/01/20   Tasia Catchings, Gudelia Eugene V, PA-C  gabapentin (NEURONTIN) 100 MG capsule TAKE 2 CAPSULES (200 MG TOTAL) BY MOUTH AT BEDTIME. 01/27/20   Lyndal Pulley, DO  ipratropium (ATROVENT) 0.06 % nasal spray Place 2 sprays into both nostrils 4 (four) times daily. 11/01/20   Tasia Catchings, Charnise Lovan V, PA-C  predniSONE (DELTASONE) 50 MG tablet Take 1 tablet (50 mg  total) by mouth daily with breakfast. 11/01/20   Ok Edwards, PA-C  Prenatal Vit-Fe Fumarate-FA (PRENATAL MULTIVITAMIN) TABS tablet Take 1 tablet by mouth daily at 12 noon.    [provider]  venlafaxine XR (EFFEXOR-XR) 75 MG 24 hr capsule TAKE 1 CAPSULE (75 MG TOTAL) BY MOUTH DAILY WITH BREAKFAST. 10/15/20   Lyndal Pulley, DO  VIIBRYD 10 MG TABS TAKE 1 TABLET BY MOUTH EVERY DAY 06/30/20   Lyndal Pulley, DO  Vitamin D, Ergocalciferol, (DRISDOL) 1.25 MG (50000 UNIT) CAPS capsule TAKE 1 CAPSULE (50,000 UNITS TOTAL) BY MOUTH EVERY 7 (SEVEN) DAYS. 09/23/20   Lyndal Pulley, DO  Vitamin D, Ergocalciferol, (DRISDOL) 1.25 MG (50000 UT) CAPS capsule TAKE 1 CAPSULE BY MOUTH EVERY 7 DAYS  06/18/19   Lyndal Pulley, DO    Family History Family History  Problem Relation Age of Onset  . Fibromyalgia Father   . Autoimmune disease Father   . Cancer Maternal Grandmother        stomach  . Diabetes Maternal Grandmother   . Diabetes Paternal Grandmother   . Heart disease Paternal Grandmother   . Alcohol abuse Paternal Grandfather   . Cancer Paternal Grandfather        lung  . Diabetes Mother     Social History Social History   Tobacco Use  . Smoking status: Never Smoker  . Smokeless tobacco: Never Used  Substance Use Topics  . Alcohol use: Yes  . Drug use: No     Allergies   Dextromethorphan hbr   Review of Systems Review of Systems  Reason unable to perform ROS: See HPI as above.     Physical Exam Triage Vital Signs ED Triage Vitals  Enc Vitals Group     BP 11/01/20 1320 (!) 142/77     Pulse Rate 11/01/20 1320 (S) (!) 101     Resp 11/01/20 1320 17     Temp 11/01/20 1320 99.5 F (37.5 C)     Temp Source 11/01/20 1320 Oral     SpO2 11/01/20 1320 97 %     Weight --      Height --      Head Circumference --      Peak Flow --      Pain Score 11/01/20 1312 3     Pain Loc --      Pain Edu? --      Excl. in [place]? --    No data found.  Updated Vital Signs BP (!) 142/77 (BP Location: Left Arm)   Pulse (S) (!) 101   Temp 99.5 F (37.5 C) (Oral)   Resp 17   LMP 10/17/2020   SpO2 97%   Physical Exam Constitutional:      General: She is not in acute distress.    Appearance: Normal appearance. She is well-developed. She is not ill-appearing, toxic-appearing or diaphoretic.  HENT:     Head: Normocephalic and atraumatic.     Right Ear: Tympanic membrane, ear canal and external ear normal. Tympanic membrane is not erythematous or bulging.     Left Ear: Tympanic membrane, ear canal and external ear normal. Tympanic membrane is not erythematous or bulging.     Nose:     Right Sinus: Maxillary sinus tenderness and frontal sinus tenderness present.      Left Sinus: Maxillary sinus tenderness and frontal sinus tenderness present.     Mouth/Throat:     Mouth: Mucous membranes are moist.  Pharynx: Oropharynx is clear. Uvula midline.  Eyes:     Conjunctiva/sclera: Conjunctivae normal.     Pupils: Pupils are equal, round, and reactive to light.  Cardiovascular:     Rate and Rhythm: Normal rate and regular rhythm.  Pulmonary:     Effort: Pulmonary effort is normal. No accessory muscle usage, prolonged expiration, respiratory distress or retractions.     Breath sounds: No decreased air movement or transmitted upper airway sounds. No decreased breath sounds.     Comments: LCTAB Musculoskeletal:     Cervical back: Normal range of motion and neck supple.  Skin:    General: Skin is warm and dry.  Neurological:     Mental Status: She is alert and oriented to person, place, and time.      UC Treatments / Results  Labs (all labs ordered are listed, but only abnormal results are displayed) Labs Reviewed  NOVEL CORONAVIRUS, NAA    EKG   Radiology No results found.  Procedures Procedures (including critical care time)  Medications Ordered in UC Medications - No data to display  Initial Impression / Assessment and Plan / UC Course  I have reviewed the triage vital signs and the nursing notes.  Pertinent labs & imaging results that were available during my care of the patient were reviewed by me and considered in my medical decision making (see chart for details).    COVID testing ordered. Doxycycline as directed. Other symptomatic treatment discussed. Written rx of prednisone provided, can start for DOE/sinus pressure if symptoms not improving after 2-3 days on doxycycline. Return precautions given.  Final Clinical Impressions(s) / UC Diagnoses   Final diagnoses:  Acute non-recurrent pansinusitis    ED Prescriptions    Medication Sig Dispense Auth. Provider   doxycycline (VIBRAMYCIN) 100 MG capsule Take 1 capsule (100 mg  total) by mouth 2 (two) times daily. 14 capsule Kashmir Leedy V, PA-C   ipratropium (ATROVENT) 0.06 % nasal spray Place 2 sprays into both nostrils 4 (four) times daily. 15 mL Almeta Geisel V, PA-C   predniSONE (DELTASONE) 50 MG tablet Take 1 tablet (50 mg total) by mouth daily with breakfast. 5 tablet Ok Edwards, PA-C     PDMP not reviewed this encounter.   Ok Edwards, PA-C 11/01/20 1409

## 2020-11-03 LAB — SARS-COV-2, NAA 2 DAY TAT

## 2020-11-03 LAB — NOVEL CORONAVIRUS, NAA: SARS-CoV-2, NAA: NOT DETECTED

## 2020-11-11 ENCOUNTER — Other Ambulatory Visit: Payer: Self-pay | Admitting: Family Medicine

## 2020-11-25 ENCOUNTER — Other Ambulatory Visit: Payer: Self-pay

## 2020-11-25 ENCOUNTER — Ambulatory Visit: Payer: 59 | Admitting: Family Medicine

## 2020-11-25 ENCOUNTER — Encounter: Payer: Self-pay | Admitting: Family Medicine

## 2020-11-25 ENCOUNTER — Ambulatory Visit (INDEPENDENT_AMBULATORY_CARE_PROVIDER_SITE_OTHER): Payer: 59

## 2020-11-25 VITALS — BP 124/88 | HR 113 | Ht 64.0 in | Wt 162.0 lb

## 2020-11-25 DIAGNOSIS — M999 Biomechanical lesion, unspecified: Secondary | ICD-10-CM

## 2020-11-25 DIAGNOSIS — M62838 Other muscle spasm: Secondary | ICD-10-CM | POA: Diagnosis not present

## 2020-11-25 DIAGNOSIS — M25512 Pain in left shoulder: Secondary | ICD-10-CM

## 2020-11-25 DIAGNOSIS — M7552 Bursitis of left shoulder: Secondary | ICD-10-CM

## 2020-11-25 NOTE — Patient Instructions (Addendum)
Xray today Exercises 3x a week See me again in 7-8 weeks

## 2020-11-25 NOTE — Progress Notes (Signed)
[place] Phone: [phone] Subjective:   Crystal [name], am serving as a scribe for Dr. Hulan Saas. This visit occurred during the SARS-CoV-2 public health emergency.  Safety protocols were in place, including screening questions prior to the visit, additional usage of staff PPE, and extensive cleaning of exam room while observing appropriate contact time as indicated for disinfecting solutions.   I'm seeing this patient by the request  of:  Bedsole, Amy E, MD  CC: Right shoulder pain in neck and low back pain follow-up  [id number]  Crystal [name] is a 39 y.o. female coming in with complaint of back and neck pain. OMT 10/14/2020. Patient states that her back pain has been fine as she got a new mattress. Does have pain in left shoulder with horizontal abduction. Insidious onset.  Patient has been doing a lot more painting recently.  Feels like that could be contributing to it.  Seems to come and go.  Medications patient has been prescribed: Effexor, Vit D  Taking:         Reviewed prior external information including notes and imaging from previsou exam, outside providers and external EMR if available.   As well as notes that were available from care everywhere and other healthcare systems.  Past medical history, social, surgical and family history all reviewed in electronic medical record.  [name] pertanent information unless stated regarding to the chief complaint.   Past Medical History:  Diagnosis Date  . Allergy   . Asthma     Allergies  Allergen Reactions  . Dextromethorphan Hbr Rash     Review of Systems:  [name] headache, visual changes, nausea, vomiting, diarrhea, constipation, dizziness, abdominal pain, skin rash, fevers, chills, night sweats, weight loss, swollen lymph nodes, body aches, joint swelling, chest pain, shortness of breath, mood changes. POSITIVE muscle aches  Objective  Blood  pressure 124/88, pulse (!) 113, height 5\' 4"  (1.626 m), weight 162 lb (73.5 kg), SpO2 99 %, unknown if currently breastfeeding.   General: [name] apparent distress alert and oriented x3 mood and affect normal, dressed appropriately.  HEENT: Pupils equal, extraocular movements intact  Respiratory: Patient's speak in full sentences and does not appear short of breath  Cardiovascular: [name] lower extremity edema, non tender, [name] erythema  Neuro: Cranial nerves II through XII are intact, neurovascularly intact in all extremities with 2+ DTRs and 2+ pulses.  Gait normal with good balance and coordination.  MSK:  Non tender with full range of motion and good stability and symmetric strength and tone of shoulders, elbows, wrist, hip, knee and ankles bilaterally.  Back -neck examination some mild loss of lordosis.  Patient does have some weakness in hip abductors.  Patient denies any radiation of pain very minimal tightness noted with straight leg test.  Osteopathic findings  C2 flexed rotated and side bent right C6 flexed rotated and side bent left T3 extended rotated and side bent right inhaled rib T9 extended rotated and side bent left L2 flexed rotated and side bent right Sacrum right on right       Assessment and Plan: Acute bursitis of left shoulder Left shoulder bursitis.  Discussed with patient about icing regimen, home exercise, which activities to doing which wants to avoid.  X-rays pending.  Follow-up again 6 to 8 weeks.  Does have gabapentin if needed for his more neck pain  Cervical paraspinal muscle spasm Stable.  Discussed icing regimen and home exercise,  which activities to do which wants to avoid.     Nonallopathic problems  Decision today to treat with OMT was based on Physical Exam  After verbal consent patient was treated with HVLA, ME, FPR techniques in cervical, rib, thoracic, lumbar, and sacral  areas  Patient tolerated the procedure well with improvement in  symptoms  Patient given exercises, stretches and lifestyle modifications  See medications in patient instructions if given  Patient will follow up in 4-8 weeks      The above documentation has been reviewed and is accurate and complete Lyndal Pulley, DO       Note: This dictation was prepared with Dragon dictation along with smaller phrase technology. Any transcriptional errors that result from this process are unintentional.

## 2020-11-25 NOTE — Assessment & Plan Note (Signed)
Stable.  Discussed icing regimen and home exercise, which activities to do which wants to avoid.

## 2020-11-25 NOTE — Assessment & Plan Note (Signed)
Left shoulder bursitis.  Discussed with patient about icing regimen, home exercise, which activities to doing which wants to avoid.  X-rays pending.  Follow-up again 6 to 8 weeks.  Does have gabapentin if needed for his more neck pain

## 2020-12-07 ENCOUNTER — Other Ambulatory Visit: Payer: Self-pay | Admitting: Family Medicine

## 2020-12-08 ENCOUNTER — Other Ambulatory Visit: Payer: Self-pay | Admitting: Family Medicine

## 2020-12-30 ENCOUNTER — Ambulatory Visit: Payer: 59 | Admitting: Family Medicine

## 2020-12-30 NOTE — Progress Notes (Deleted)
  [name] [address] [place] [address] Phone: [phone] Subjective:    I'm seeing this patient by the request  of:  Excell Seltzer, MD  CC:   [id number]   11/25/2020 Left shoulder bursitis.  Discussed with patient about icing regimen, home exercise, which activities to doing which wants to avoid.  X-rays pending.  Follow-up again 6 to 8 weeks.  Does have gabapentin if needed for his more neck pain  Update 12/30/2020 Crystal [name] is a 40 y.o. female coming in with complaint of back and neck pain. Left shoulder pain follow-up. OMT 11/25/2020. Patient states   Medications patient has been prescribed: Effexor, Vit D  Taking:         Reviewed prior external information including notes and imaging from previsou exam, outside providers and external EMR if available.   As well as notes that were available from care everywhere and other healthcare systems.  Past medical history, social, surgical and family history all reviewed in electronic medical record.  No pertanent information unless stated regarding to the chief complaint.   Past Medical History:  Diagnosis Date  . Allergy   . Asthma     Allergies  Allergen Reactions  . Dextromethorphan Hbr Rash     Review of Systems:  No headache, visual changes, nausea, vomiting, diarrhea, constipation, dizziness, abdominal pain, skin rash, fevers, chills, night sweats, weight loss, swollen lymph nodes, body aches, joint swelling, chest pain, shortness of breath, mood changes. POSITIVE muscle aches  Objective  unknown if currently breastfeeding.   General: No apparent distress alert and oriented x3 mood and affect normal, dressed appropriately.  HEENT: Pupils equal, extraocular movements intact  Respiratory: Patient's speak in full sentences and does not appear short of breath  Cardiovascular: No lower extremity edema, non tender, no erythema  Neuro: Cranial nerves II through XII are  intact, neurovascularly intact in all extremities with 2+ DTRs and 2+ pulses.  Gait normal with good balance and coordination.  MSK:  Non tender with full range of motion and good stability and symmetric strength and tone of shoulders, elbows, wrist, hip, knee and ankles bilaterally.  Back - Normal skin, Spine with normal alignment and no deformity.  No tenderness to vertebral process palpation.  Paraspinous muscles are not tender and without spasm.   Range of motion is full at neck and lumbar sacral regions  Osteopathic findings  C2 flexed rotated and side bent right C6 flexed rotated and side bent left T3 extended rotated and side bent right inhaled rib T9 extended rotated and side bent left L2 flexed rotated and side bent right Sacrum right on right       Assessment and Plan:    Nonallopathic problems  Decision today to treat with OMT was based on Physical Exam  After verbal consent patient was treated with HVLA, ME, FPR techniques in cervical, rib, thoracic, lumbar, and sacral  areas  Patient tolerated the procedure well with improvement in symptoms  Patient given exercises, stretches and lifestyle modifications  See medications in patient instructions if given  Patient will follow up in 4-8 weeks      The above documentation has been reviewed and is accurate and complete Judi Saa, DO       Note: This dictation was prepared with Dragon dictation along with smaller phrase technology. Any transcriptional errors that result from this process are unintentional.

## 2020-12-31 ENCOUNTER — Ambulatory Visit: Payer: 59 | Admitting: Family Medicine

## 2021-01-01 ENCOUNTER — Other Ambulatory Visit: Payer: Self-pay | Admitting: Family Medicine

## 2021-01-04 NOTE — Progress Notes (Deleted)
  [place] Phone: [phone] Subjective:    I'm seeing this patient by the request  of:  Jinny Sanders, MD  CC:   [id number]  Crystal [name] is a 40 y.o. female coming in with complaint of back and neck pain. OMT 11/25/2020. Patient states   Medications patient has been prescribed: Effexor, Vit D,   Taking:         Reviewed prior external information including notes and imaging from previsou exam, outside providers and external EMR if available.   As well as notes that were available from care everywhere and other healthcare systems.  Past medical history, social, surgical and family history all reviewed in electronic medical record.  No pertanent information unless stated regarding to the chief complaint.   Past Medical History:  Diagnosis Date  . Allergy   . Asthma     Allergies  Allergen Reactions  . Dextromethorphan Hbr Rash     Review of Systems:  No headache, visual changes, nausea, vomiting, diarrhea, constipation, dizziness, abdominal pain, skin rash, fevers, chills, night sweats, weight loss, swollen lymph nodes, body aches, joint swelling, chest pain, shortness of breath, mood changes. POSITIVE muscle aches  Objective  unknown if currently breastfeeding.   General: No apparent distress alert and oriented x3 mood and affect normal, dressed appropriately.  HEENT: Pupils equal, extraocular movements intact  Respiratory: Patient's speak in full sentences and does not appear short of breath  Cardiovascular: No lower extremity edema, non tender, no erythema  Neuro: Cranial nerves II through XII are intact, neurovascularly intact in all extremities with 2+ DTRs and 2+ pulses.  Gait normal with good balance and coordination.  MSK:  Non tender with full range of motion and good stability and symmetric strength and tone of shoulders, elbows, wrist, hip, knee and ankles bilaterally.  Back -  Normal skin, Spine with normal alignment and no deformity.  No tenderness to vertebral process palpation.  Paraspinous muscles are not tender and without spasm.   Range of motion is full at neck and lumbar sacral regions  Osteopathic findings  C2 flexed rotated and side bent right C6 flexed rotated and side bent left T3 extended rotated and side bent right inhaled rib T9 extended rotated and side bent left L2 flexed rotated and side bent right Sacrum right on right       Assessment and Plan:    Nonallopathic problems  Decision today to treat with OMT was based on Physical Exam  After verbal consent patient was treated with HVLA, ME, FPR techniques in cervical, rib, thoracic, lumbar, and sacral  areas  Patient tolerated the procedure well with improvement in symptoms  Patient given exercises, stretches and lifestyle modifications  See medications in patient instructions if given  Patient will follow up in 4-8 weeks      The above documentation has been reviewed and is accurate and complete Jacqualin Combes       Note: This dictation was prepared with Dragon dictation along with smaller phrase technology. Any transcriptional errors that result from this process are unintentional.

## 2021-01-05 ENCOUNTER — Ambulatory Visit: Payer: 59 | Admitting: Family Medicine

## 2021-01-07 NOTE — Progress Notes (Deleted)
  [place] Phone: [phone] Subjective:    I'm seeing this patient by the request  of:  Jinny Sanders, MD  CC:   [id number]  FREDDY SPADAFORA is a 40 y.o. female coming in with complaint of back and neck pain. OMT 11/25/2020. Patient states   Medications patient has been prescribed: Effexor  Taking:         Reviewed prior external information including notes and imaging from previsou exam, outside providers and external EMR if available.   As well as notes that were available from care everywhere and other healthcare systems.  Past medical history, social, surgical and family history all reviewed in electronic medical record.  No pertanent information unless stated regarding to the chief complaint.   Past Medical History:  Diagnosis Date  . Allergy   . Asthma     Allergies  Allergen Reactions  . Dextromethorphan Hbr Rash     Review of Systems:  No headache, visual changes, nausea, vomiting, diarrhea, constipation, dizziness, abdominal pain, skin rash, fevers, chills, night sweats, weight loss, swollen lymph nodes, body aches, joint swelling, chest pain, shortness of breath, mood changes. POSITIVE muscle aches  Objective  unknown if currently breastfeeding.   General: No apparent distress alert and oriented x3 mood and affect normal, dressed appropriately.  HEENT: Pupils equal, extraocular movements intact  Respiratory: Patient's speak in full sentences and does not appear short of breath  Cardiovascular: No lower extremity edema, non tender, no erythema  Neuro: Cranial nerves II through XII are intact, neurovascularly intact in all extremities with 2+ DTRs and 2+ pulses.  Gait normal with good balance and coordination.  MSK:  Non tender with full range of motion and good stability and symmetric strength and tone of shoulders, elbows, wrist, hip, knee and ankles bilaterally.  Back - Normal  skin, Spine with normal alignment and no deformity.  No tenderness to vertebral process palpation.  Paraspinous muscles are not tender and without spasm.   Range of motion is full at neck and lumbar sacral regions  Osteopathic findings  C2 flexed rotated and side bent right C6 flexed rotated and side bent left T3 extended rotated and side bent right inhaled rib T9 extended rotated and side bent left L2 flexed rotated and side bent right Sacrum right on right       Assessment and Plan:    Nonallopathic problems  Decision today to treat with OMT was based on Physical Exam  After verbal consent patient was treated with HVLA, ME, FPR techniques in cervical, rib, thoracic, lumbar, and sacral  areas  Patient tolerated the procedure well with improvement in symptoms  Patient given exercises, stretches and lifestyle modifications  See medications in patient instructions if given  Patient will follow up in 4-8 weeks      The above documentation has been reviewed and is accurate and complete Jacqualin Combes       Note: This dictation was prepared with Dragon dictation along with smaller phrase technology. Any transcriptional errors that result from this process are unintentional.

## 2021-01-11 ENCOUNTER — Ambulatory Visit: Payer: 59 | Admitting: Family Medicine

## 2021-01-13 ENCOUNTER — Encounter: Payer: Self-pay | Admitting: Family Medicine

## 2021-01-13 ENCOUNTER — Ambulatory Visit: Payer: 59 | Admitting: Family Medicine

## 2021-01-13 ENCOUNTER — Other Ambulatory Visit: Payer: Self-pay

## 2021-01-13 VITALS — BP 122/74 | HR 108 | Wt 171.0 lb

## 2021-01-13 DIAGNOSIS — M533 Sacrococcygeal disorders, not elsewhere classified: Secondary | ICD-10-CM | POA: Diagnosis not present

## 2021-01-13 DIAGNOSIS — M999 Biomechanical lesion, unspecified: Secondary | ICD-10-CM | POA: Diagnosis not present

## 2021-01-13 DIAGNOSIS — M2550 Pain in unspecified joint: Secondary | ICD-10-CM

## 2021-01-13 NOTE — Patient Instructions (Addendum)
Labs today Follow up with PCP otherwise If Vit D is elevated lets go to OTC Continue other meds Consider massage See me in 6-8 weeks

## 2021-01-13 NOTE — Assessment & Plan Note (Addendum)
Pain in lower back, some mild increase in tightnes due to lack of activity wwith recent COVID. Seems to be feeling fine, encourage HEP, discussed core strengthening, responds to OMT, Conitnue meds including effexor. , vitamin D but will check labs due to nearly 2 years without checking, encourage she follow up with primary  See me again in 6-8 weeks

## 2021-01-13 NOTE — Progress Notes (Signed)
Crystal [name] Phone: [phone] Subjective:    I'm seeing this patient by the request  of:  Bedsole, Amy E, MD  CC: low back and neck pain follow up   [id number]  [name] is a 39 y.o. female coming in with complaint of back and neck pain. OMT 11/25/2020. Patient states that her family had COVID. Was having pain in between scapula but that pain has subsided. No issues with back otherwise.   Medications patient has been prescribed: Effexor  Taking: yes         Reviewed prior external information including notes and imaging from previsou exam, outside providers and external EMR if available.   As well as notes that were available from care everywhere and other healthcare systems.  Past medical history, social, surgical and family history all reviewed in electronic medical record.  No pertanent information unless stated regarding to the chief complaint.   Past Medical History:  Diagnosis Date  . Allergy   . Asthma     Allergies  Allergen Reactions  . Dextromethorphan Hbr Rash     Review of Systems:  No headache, visual changes, nausea, vomiting, diarrhea, constipation, dizziness, abdominal pain, skin rash, fevers, chills, night sweats, weight loss, swollen lymph nodes, body aches, joint swelling, chest pain, shortness of breath, mood changes. POSITIVE muscle aches  Objective  Blood pressure 122/74, pulse (!) 108, weight 171 lb (77.6 kg), SpO2 99 %, unknown if currently breastfeeding.   General: No apparent distress alert and oriented x3 mood and affect normal, dressed appropriately.  HEENT: Pupils equal, extraocular movements intact  Respiratory: Patient's speak in full sentences and does not appear short of breath  Cardiovascular: No lower extremity edema, non tender, no erythema  Neuro: Cranial nerves II through XII are intact, neurovascularly intact in all extremities with 2+ DTRs and 2+  pulses.  Gait normal with good balance and coordination.  MSK:  Non tender with full range of motion and good stability and symmetric strength and tone of shoulders, elbows, wrist, hip, knee and ankles bilaterally.  Back -low back pain noted, mild tightness in all planes, tightness with FABER, negative SLT, TTP in the parascapular region R>L Negative spurling of the neck but also tightness noted. No crepitus, mild shotty lymph nodes of the neck noted   Osteopathic findings  C6 flexed rotated and side bent left T3 extended rotated and side bent right inhaled rib T9 extended rotated and side bent left L2 flexed rotated and side bent right Sacrum right on right       Assessment and Plan:  SI (sacroiliac) joint dysfunction Pain in lower back, some mild increase in tightnes due to lack of activity wwith recent COVID. Seems to be feeling fine, encourage HEP, discussed core strengthening, responds to OMT, Conitnue meds including effexor. , vitamin D but will check labs due to nearly 2 years without checking, encourage she follow up with primary  See me again in 6-8 weeks     Nonallopathic problems  Decision today to treat with OMT was based on Physical Exam  After verbal consent patient was treated with HVLA, ME, FPR techniques in cervical, rib, thoracic, lumbar, and sacral  areas  Patient tolerated the procedure well with improvement in symptoms  Patient given exercises, stretches and lifestyle modifications  See medications in patient instructions if given  Patient will follow up in 4-8 weeks      The above documentation has  been reviewed and is accurate and complete Crystal [name], Crystal [name]       Note: This dictation was prepared with Dragon dictation along with smaller phrase technology. Any transcriptional errors that result from this process are unintentional.

## 2021-01-14 ENCOUNTER — Other Ambulatory Visit: Payer: Self-pay

## 2021-01-14 DIAGNOSIS — M545 Low back pain, unspecified: Secondary | ICD-10-CM

## 2021-01-14 DIAGNOSIS — M542 Cervicalgia: Secondary | ICD-10-CM

## 2021-01-14 LAB — CBC WITH DIFFERENTIAL/PLATELET
Basophils Absolute: 0.1 10*3/uL (ref 0.0–0.1)
Basophils Relative: 1.2 % (ref 0.0–3.0)
Eosinophils Absolute: 0.2 10*3/uL (ref 0.0–0.7)
Eosinophils Relative: 2.5 % (ref 0.0–5.0)
HCT: 37.8 % (ref 36.0–46.0)
Hemoglobin: 12.7 g/dL (ref 12.0–15.0)
Lymphocytes Relative: 30.5 % (ref 12.0–46.0)
Lymphs Abs: 2.4 10*3/uL (ref 0.7–4.0)
MCHC: 33.7 g/dL (ref 30.0–36.0)
MCV: 94.3 fl (ref 78.0–100.0)
Monocytes Absolute: 0.5 10*3/uL (ref 0.1–1.0)
Monocytes Relative: 6.8 % (ref 3.0–12.0)
Neutro Abs: 4.7 10*3/uL (ref 1.4–7.7)
Neutrophils Relative %: 59 % (ref 43.0–77.0)
Platelets: 315 10*3/uL (ref 150.0–400.0)
RBC: 4.01 Mil/uL (ref 3.87–5.11)
RDW: 13 % (ref 11.5–15.5)
WBC: 7.9 10*3/uL (ref 4.0–10.5)

## 2021-01-14 LAB — VITAMIN D 25 HYDROXY (VIT D DEFICIENCY, FRACTURES): VITD: 77.19 ng/mL (ref 30.00–100.00)

## 2021-01-14 LAB — COMPREHENSIVE METABOLIC PANEL WITH GFR
ALT: 57 U/L — ABNORMAL HIGH (ref 0–35)
AST: 37 U/L (ref 0–37)
Albumin: 4.7 g/dL (ref 3.5–5.2)
Alkaline Phosphatase: 67 U/L (ref 39–117)
BUN: 9 mg/dL (ref 6–23)
CO2: 27 meq/L (ref 19–32)
Calcium: 10.1 mg/dL (ref 8.4–10.5)
Chloride: 102 meq/L (ref 96–112)
Creatinine, Ser: 0.88 mg/dL (ref 0.40–1.20)
GFR: 82.39 mL/min
Glucose, Bld: 94 mg/dL (ref 70–99)
Potassium: 4.3 meq/L (ref 3.5–5.1)
Sodium: 137 meq/L (ref 135–145)
Total Bilirubin: 0.4 mg/dL (ref 0.2–1.2)
Total Protein: 7.9 g/dL (ref 6.0–8.3)

## 2021-01-14 LAB — TSH: TSH: 0.76 u[IU]/mL (ref 0.35–4.50)

## 2021-01-15 ENCOUNTER — Ambulatory Visit (INDEPENDENT_AMBULATORY_CARE_PROVIDER_SITE_OTHER): Payer: 59

## 2021-01-15 DIAGNOSIS — M542 Cervicalgia: Secondary | ICD-10-CM | POA: Diagnosis not present

## 2021-01-15 DIAGNOSIS — M545 Low back pain, unspecified: Secondary | ICD-10-CM | POA: Diagnosis not present

## 2021-01-26 ENCOUNTER — Ambulatory Visit: Payer: 59 | Admitting: Family Medicine

## 2021-01-26 ENCOUNTER — Ambulatory Visit (INDEPENDENT_AMBULATORY_CARE_PROVIDER_SITE_OTHER): Payer: 59

## 2021-01-26 ENCOUNTER — Other Ambulatory Visit: Payer: Self-pay

## 2021-01-26 ENCOUNTER — Ambulatory Visit: Payer: Self-pay

## 2021-01-26 ENCOUNTER — Other Ambulatory Visit: Payer: Self-pay | Admitting: Family Medicine

## 2021-01-26 ENCOUNTER — Encounter: Payer: Self-pay | Admitting: Family Medicine

## 2021-01-26 VITALS — BP 110/80 | HR 89 | Ht 64.0 in | Wt 171.0 lb

## 2021-01-26 DIAGNOSIS — M533 Sacrococcygeal disorders, not elsewhere classified: Secondary | ICD-10-CM | POA: Diagnosis not present

## 2021-01-26 DIAGNOSIS — S8002XA Contusion of left knee, initial encounter: Secondary | ICD-10-CM | POA: Diagnosis not present

## 2021-01-26 DIAGNOSIS — M25562 Pain in left knee: Secondary | ICD-10-CM | POA: Diagnosis not present

## 2021-01-26 DIAGNOSIS — M999 Biomechanical lesion, unspecified: Secondary | ICD-10-CM | POA: Diagnosis not present

## 2021-01-26 MED ORDER — VENLAFAXINE HCL ER 75 MG PO CP24: ORAL_CAPSULE | ORAL | 0 refills | Status: DC

## 2021-01-26 NOTE — Assessment & Plan Note (Signed)
Patient continues to have more of the back pain.  Some mild radicular symptoms that likely secondary to the fall.  Patient did have x-rays recently that did show an L5-S1 degenerative disc disease.  We discussed the possibility of needing further imaging such as advanced imaging if we give her trouble.  Patient responded well to manipulation.  Has the gabapentin for breakthrough.  Continuing the Effexor.  Follow-up with me again in 4 to 8 weeks

## 2021-01-26 NOTE — Assessment & Plan Note (Signed)
Contusion of the left knee.  Discussed with patient some bouncing Arnica lotion, x-rays ordered today but patient is able to ambulate fairly regularly.  Discussed increasing activity slowly.  Discussed icing regimen.  Follow-up again in 4 weeks

## 2021-01-26 NOTE — Progress Notes (Signed)
[place] Phone: [phone] Subjective:   Crystal [name], am serving as a scribe for Dr. Hulan Saas. This visit occurred during the SARS-CoV-2 public health emergency.  Safety protocols were in place, including screening questions prior to the visit, additional usage of staff PPE, and extensive cleaning of exam room while observing appropriate contact time as indicated for disinfecting solutions.   I'm seeing this patient by the request  of:  Bedsole, Amy E, MD  CC: Back and neck pain follow-up, new knee pain  [id number]  Crystal [name] is a 40 y.o. female coming in with complaint of back and neck pain. OMT 01/13/2021. Patient states that she is having right hip pain that is going down her right leg in the posterior aspect.  This is a little different  Also having left knee pain when she landed on her knee.  Patient states that she fell directly on cement.  Had significant bruising.  States that the bruising has improved but still she puts any pressure on her kneecap severe amount of pain.  States that she can walk fairly good just has tightness of the knee noted         Reviewed prior external information including notes and imaging from previsou exam, outside providers and external EMR if available.   As well as notes that were available from care everywhere and other healthcare systems.  Past medical history, social, surgical and family history all reviewed in electronic medical record.  [name] pertanent information unless stated regarding to the chief complaint.   Past Medical History:  Diagnosis Date  . Allergy   . Asthma     Allergies  Allergen Reactions  . Dextromethorphan Hbr Rash     Review of Systems:  [name] headache, visual changes, nausea, vomiting, diarrhea, constipation, dizziness, abdominal pain, skin rash, fevers, chills, night sweats, weight loss, swollen lymph nodes, body aches, joint  swelling, chest pain, shortness of breath, mood changes. POSITIVE muscle aches  Objective  Blood pressure 110/80, pulse 89, height 5\' 4"  (1.626 m), weight 171 lb (77.6 kg), last menstrual period 01/07/2021, SpO2 98 %, unknown if currently breastfeeding.   General: [name] apparent distress alert and oriented x3 mood and affect normal, dressed appropriately.  HEENT: Pupils equal, extraocular movements intact  Respiratory: Patient's speak in full sentences and does not appear short of breath  Cardiovascular: [name] lower extremity edema, non tender, [name] erythema  Gait normal with good balance and coordination.  MSK: Left knee exam shows the patient does have a contusion on the anterior aspect of the knee.  Bruising seems to be resolving.  Nontender to palpation over the anterior medial aspect just inferior to the tibial tuberosity.  Full range of motion of the knee noted. Back -low back exam tightness noted of the right side of the paraspinal musculature.  Negative straight leg test noted today though.  Tenderness over the right sacroiliac joint.  Tightness also in the thoracolumbar juncture  Osteopathic findings  C6 flexed rotated and side bent left T3 extended rotated and side bent right inhaled rib L2 flexed rotated and side bent right Sacrum right on right   Limited musculoskeletal ultrasound was performed and interpreted by Lyndal Pulley  Limited ultrasound of patient's left knee shows the patient's medial meniscus is unremarkable.  Patient does have a infrapatellar bursa that seems to be enlarged noted in the area of where patient has the most pain.  Tibial  plateau appears to be unremarkable.    Assessment and Plan:  Contusion of left knee Contusion of the left knee.  Discussed with patient some bouncing Arnica lotion, x-rays ordered today but patient is able to ambulate fairly regularly.  Discussed increasing activity slowly.  Discussed icing regimen.  Follow-up again in 4 weeks  SI  (sacroiliac) joint dysfunction Patient continues to have more of the back pain.  Some mild radicular symptoms that likely secondary to the fall.  Patient did have x-rays recently that did show an L5-S1 degenerative disc disease.  We discussed the possibility of needing further imaging such as advanced imaging if we give her trouble.  Patient responded well to manipulation.  Has the gabapentin for breakthrough.  Continuing the Effexor.  Follow-up with me again in 4 to 8 weeks    Nonallopathic problems  Decision today to treat with OMT was based on Physical Exam  After verbal consent patient was treated with HVLA, ME, FPR techniques in cervical, rib, thoracic, lumbar, and sacral  areas  Patient tolerated the procedure well with improvement in symptoms  Patient given exercises, stretches and lifestyle modifications  See medications in patient instructions if given  Patient will follow up in 4-8 weeks      The above documentation has been reviewed and is accurate and complete Lyndal Pulley, DO        Note: This dictation was prepared with Dragon dictation along with smaller phrase technology. Any transcriptional errors that result from this process are unintentional.

## 2021-01-26 NOTE — Patient Instructions (Signed)
Good to see you  Arnica lotion 2 times a day to the knee  Xray on the way out of the knee Heat 10 minutes then ice 10 minutes 2-3 times a day to the knee I think the fall made the back angry  Tried manipulation again  If back does not get better give me a call  Otherwise see you as scheduled

## 2021-02-09 ENCOUNTER — Ambulatory Visit
Admission: EM | Admit: 2021-02-09 | Discharge: 2021-02-09 | Disposition: A | Discharge status: Home / Self Care | Payer: 59 | Attending: Emergency Medicine | Admitting: Emergency Medicine

## 2021-02-09 ENCOUNTER — Other Ambulatory Visit: Payer: Self-pay

## 2021-02-09 ENCOUNTER — Encounter: Payer: Self-pay | Admitting: Emergency Medicine

## 2021-02-09 DIAGNOSIS — J4520 Mild intermittent asthma, uncomplicated: Secondary | ICD-10-CM | POA: Diagnosis not present

## 2021-02-09 DIAGNOSIS — J069 Acute upper respiratory infection, unspecified: Secondary | ICD-10-CM | POA: Diagnosis not present

## 2021-02-09 MED ORDER — BENZONATATE 200 MG PO CAPS: 200.0000 mg | ORAL_CAPSULE | Freq: Three times a day (TID) | ORAL | 0 refills | Status: AC | PRN

## 2021-02-09 MED ORDER — ALBUTEROL SULFATE HFA 108 (90 BASE) MCG/ACT IN AERS
1.0000 | INHALATION_SPRAY | Freq: Four times a day (QID) | RESPIRATORY_TRACT | 0 refills | Status: DC | PRN
Start: 2021-02-09 — End: 2023-01-03

## 2021-02-09 MED ORDER — DOXYCYCLINE HYCLATE 100 MG PO CAPS: 100.0000 mg | ORAL_CAPSULE | Freq: Two times a day (BID) | ORAL | 0 refills | Status: AC

## 2021-02-09 MED ORDER — PREDNISONE 50 MG PO TABS: 50.0000 mg | ORAL_TABLET | Freq: Every day | ORAL | 0 refills | Status: AC

## 2021-02-09 NOTE — Discharge Instructions (Signed)
Continue albuterol inhaler as needed Tessalon every 8 hours for cough Prednisone daily for 5 days Continue Mucinex over-the-counter May fill prescription for doxycycline on Friday if not seeing any improvement with the above over the next 3 to 4 days Follow-up if any symptoms not improving or worsening

## 2021-02-09 NOTE — ED Provider Notes (Signed)
[facility]    CSN: [id number] Arrival date & time: 02/09/21  1140      History   Chief Complaint Chief Complaint  Patient presents with  . Cough    HPI Crystal [name] is a 40 y.o. female history of asthma presenting today for evaluation of URI symptoms and cough.  Reports over the past 5 days she has had URI symptoms which have moved into her chest and has caused some chest tightness.  She is also had subjective fevers with associated aches.  Recent Covid infection early January 2022.  Reports history of asthma, using inhaler at home with some relief.  Using Mucinex without full relief of symptoms.  Reports improvement with prednisone in the past with similar symptoms.  HPI  Past Medical History:  Diagnosis Date  . Allergy   . Asthma     Patient Active Problem List   Diagnosis Date Noted  . Contusion of left knee 01/26/2021  . Acute bursitis of left shoulder 11/25/2020  . Nonallopathic lesion of rib cage 12/06/2019  . Nonallopathic lesion of cervical region 03/06/2019  . Cervical paraspinal muscle spasm 02/13/2019  . 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 Gestational diabetes mellitus (GDM) controlled on oral hypoglycemic drug 10/03/2017  . Abnormal fetal heart rate 09/12/2017  . Trigger point of right shoulder region 11/25/2016  . Concussion with loss of consciousness 11/16/2016  . Whiplash 11/16/2016  . Hamstring tendinitis at origin 06/02/2016  . 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  . Lymphadenopathy 11/10/2015  . Posterior interosseous nerve pain 09/11/2015  . Generalized anxiety disorder 11/28/2014  . Back spasm 10/10/2014  . Nonallopathic lesion of lumbosacral region 10/10/2014  . Nonallopathic lesion of sacral region 10/10/2014  . Nonallopathic lesion of thoracic region 10/10/2014  . Strain of left quadriceps muscle 09/05/2014  . Chest pain 09/05/2014  . Stress incontinence in female 05/23/2014  . CARPAL TUNNEL SYNDROME, LEFT 09/14/2010   . GANGLION CYST, WRIST, LEFT 09/14/2010  . MIGRAINE, COMMON 05/27/2008  . ALLERGIC RHINITIS 05/27/2008  . DIABETES MELLITUS, GESTATIONAL, HX OF 05/27/2008  . MALIGNANT MELANOMA SKIN LOWER LIMB INCLUDING HIP 12/27/2003    Past Surgical History:  Procedure Laterality Date  . HYSTEROSCOPY WITH NOVASURE N/A 05/23/2014   Procedure: Velvet Bathe;  Surgeon: Cheri Fowler, MD;  Location: [facility];  Service: Gynecology;  Laterality: N/A;  . SKIN CANCER EXCISION      OB History    Gravida  5   Para  4   Term  4   Preterm      AB      Living  4     SAB      IAB      Ectopic      Multiple  0   Live Births  4            Home Medications    Prior to Admission medications   Medication Sig Start Date End Date Taking? Authorizing Provider  albuterol (VENTOLIN HFA) 108 (90 Base) MCG/ACT inhaler Inhale 1-2 puffs into the lungs every 6 (six) hours as needed for wheezing or shortness of breath. 02/09/21  Yes Coline Calkin C, PA-C  benzonatate (TESSALON) 200 MG capsule Take 1 capsule (200 mg total) by mouth 3 (three) times daily as needed for up to 7 days for cough. 02/09/21 02/16/21 Yes Carinne Brandenburger C, PA-C  doxycycline (VIBRAMYCIN) 100 MG capsule Take 1 capsule (100 mg total) by mouth 2 (two) times daily for 7 days. 02/12/21  02/19/21 Yes Latavion Halls C, PA-C  predniSONE (DELTASONE) 50 MG tablet Take 1 tablet (50 mg total) by mouth daily with breakfast for 5 days. 02/09/21 02/14/21 Yes Adonai Helzer C, PA-C  amphetamine-dextroamphetamine (ADDERALL) 15 MG tablet Take 15 mg by mouth daily.    [provider]  gabapentin (NEURONTIN) 100 MG capsule TAKE 2 CAPSULES (200 MG TOTAL) BY MOUTH AT BEDTIME. 01/27/20   Lyndal Pulley, DO  ipratropium (ATROVENT) 0.06 % nasal spray Place 2 sprays into both nostrils 4 (four) times daily. 11/01/20   Ok Edwards, PA-C  Prenatal Vit-Fe Fumarate-FA (PRENATAL MULTIVITAMIN) TABS tablet Take 1 tablet by mouth daily at 12 noon.    [provider]  venlafaxine XR (EFFEXOR-XR) 75 MG 24 hr capsule TAKE 1 CAPSULE BY MOUTH DAILY WITH BREAKFAST. 01/26/21   Lyndal Pulley, DO  Vitamin D, Ergocalciferol, (DRISDOL) 1.25 MG (50000 UNIT) CAPS capsule TAKE 1 CAPSULE (50,000 UNITS TOTAL) BY MOUTH EVERY 7 (SEVEN) DAYS. 12/07/20   Lyndal Pulley, DO    Family History Family History  Problem Relation Age of Onset  . Fibromyalgia Father   . Autoimmune disease Father   . Cancer Maternal Grandmother        stomach  . Diabetes Maternal Grandmother   . Diabetes Paternal Grandmother   . Heart disease Paternal Grandmother   . Alcohol abuse Paternal Grandfather   . Cancer Paternal Grandfather        lung  . Diabetes Mother     Social History Social History   Tobacco Use  . Smoking status: Never Smoker  . Smokeless tobacco: Never Used  Substance Use Topics  . Alcohol use: Yes  . Drug use: No     Allergies   Dextromethorphan hbr   Review of Systems Review of Systems  Constitutional: Negative for activity change, appetite change, chills, fatigue and fever.  HENT: Positive for congestion, rhinorrhea and sinus pressure. Negative for ear pain, sore throat and trouble swallowing.   Eyes: Negative for discharge and redness.  Respiratory: Positive for cough and chest tightness. Negative for shortness of breath.   Cardiovascular: Negative for chest pain.  Gastrointestinal: Negative for abdominal pain, diarrhea, nausea and vomiting.  Musculoskeletal: Negative for myalgias.  Skin: Negative for rash.  Neurological: Negative for dizziness, light-headedness and headaches.     Physical Exam Triage Vital Signs ED Triage Vitals  Enc Vitals Group     BP      Pulse      Resp      Temp      Temp src      SpO2      Weight      Height      Head Circumference      Peak Flow      Pain Score      Pain Loc      Pain Edu?      Excl. in [place]?    No data found.  Updated Vital Signs BP 133/86 (BP Location: Left Arm)   Pulse  98   Temp 98.1 F (36.7 C) (Oral)   Resp 18   SpO2 96%   Visual Acuity Right Eye Distance:   Left Eye Distance:   Bilateral Distance:    Right Eye Near:   Left Eye Near:    Bilateral Near:     Physical Exam Vitals and nursing note reviewed.  Constitutional:      Appearance: She is well-developed and well-nourished.  Comments: No acute distress  HENT:     Head: Normocephalic and atraumatic.     Ears:     Comments: Bilateral ears without tenderness to palpation of external auricle, tragus and mastoid, EAC's without erythema or swelling, TM's with good bony landmarks and cone of light. Non erythematous.     Nose: Nose normal.     Mouth/Throat:     Comments: Oral mucosa pink and moist, no tonsillar enlargement or exudate. Posterior pharynx patent and nonerythematous, no uvula deviation or swelling. Normal phonation. Eyes:     Conjunctiva/sclera: Conjunctivae normal.  Cardiovascular:     Rate and Rhythm: Normal rate.  Pulmonary:     Effort: Pulmonary effort is normal. No respiratory distress.     Comments: Breathing comfortably at rest, CTABL, no wheezing, rales or other adventitious sounds auscultated Abdominal:     General: There is no distension.  Musculoskeletal:        General: Normal range of motion.     Cervical back: Neck supple.  Skin:    General: Skin is warm and dry.  Neurological:     Mental Status: She is alert and oriented to person, place, and time.  Psychiatric:        Mood and Affect: Mood and affect normal.      UC Treatments / Results  Labs (all labs ordered are listed, but only abnormal results are displayed) Labs Reviewed - No data to display  EKG   Radiology No results found.  Procedures Procedures (including critical care time)  Medications Ordered in UC Medications - No data to display  Initial Impression / Assessment and Plan / UC Course  I have reviewed the triage vital signs and the nursing notes.  Pertinent labs & imaging  results that were available during my care of the patient were reviewed by me and considered in my medical decision making (see chart for details).     Viral URI with cough-continue symptomatic and supportive treatment, lungs good auscultation now, but providing prednisone to help with any underlying inflammation, continue albuterol inhaler as needed, Tessalon for cough, provided prescription for doxycycline to fill at the end of the week if not having any improvement with recommendations today over the next 3 to 4 days.  Discussed strict return precautions. Patient verbalized understanding and is agreeable with plan.  Final Clinical Impressions(s) / UC Diagnoses   Final diagnoses:  Viral URI with cough  Mild intermittent asthma without complication     Discharge Instructions     Continue albuterol inhaler as needed Tessalon every 8 hours for cough Prednisone daily for 5 days Continue Mucinex over-the-counter May fill prescription for doxycycline on Friday if not seeing any improvement with the above over the next 3 to 4 days Follow-up if any symptoms not improving or worsening    ED Prescriptions    Medication Sig Dispense Auth. Provider   albuterol (VENTOLIN HFA) 108 (90 Base) MCG/ACT inhaler Inhale 1-2 puffs into the lungs every 6 (six) hours as needed for wheezing or shortness of breath. 18 g Peggy Monk C, PA-C   benzonatate (TESSALON) 200 MG capsule Take 1 capsule (200 mg total) by mouth 3 (three) times daily as needed for up to 7 days for cough. 28 capsule Tatum Corl C, PA-C   predniSONE (DELTASONE) 50 MG tablet Take 1 tablet (50 mg total) by mouth daily with breakfast for 5 days. 5 tablet Jamielynn Wigley C, PA-C   doxycycline (VIBRAMYCIN) 100 MG capsule Take 1 capsule (100  mg total) by mouth 2 (two) times daily for 7 days. 14 capsule Briella Hobday, [place] C, PA-C     PDMP not reviewed this encounter.   Joneen Caraway [place] C, PA-C 02/09/21 1230

## 2021-02-09 NOTE — ED Triage Notes (Signed)
Pt sts cough and URI sx with fever and body aches x 5 days; pt sts had covid last month

## 2021-02-23 NOTE — Progress Notes (Deleted)
  [place] Phone: [phone] Subjective:    I'm seeing this patient by the request  of:  Bedsole, Amy E, MD  CC: back and neck pain   [id number]  Crystal [name] is a 40 y.o. female coming in with complaint of back and neck pain. OMT 01/26/2021. Also following up for left knee contusion. Patient states   Medications patient has been prescribed:   Taking:    Xray left knee 01/26/2021 IMPRESSION: Mild anterior soft tissue edema and trace joint effusion. No acute osseous abnormality.     Reviewed prior external information including notes and imaging from previsou exam, outside providers and external EMR if available.   As well as notes that were available from care everywhere and other healthcare systems.  Past medical history, social, surgical and family history all reviewed in electronic medical record.  No pertanent information unless stated regarding to the chief complaint.   Past Medical History:  Diagnosis Date  . Allergy   . Asthma     Allergies  Allergen Reactions  . Dextromethorphan Hbr Rash     Review of Systems:  No headache, visual changes, nausea, vomiting, diarrhea, constipation, dizziness, abdominal pain, skin rash, fevers, chills, night sweats, weight loss, swollen lymph nodes, body aches, joint swelling, chest pain, shortness of breath, mood changes. POSITIVE muscle aches  Objective  unknown if currently breastfeeding.   General: No apparent distress alert and oriented x3 mood and affect normal, dressed appropriately.  HEENT: Pupils equal, extraocular movements intact  Respiratory: Patient's speak in full sentences and does not appear short of breath  Cardiovascular: No lower extremity edema, non tender, no erythema  Gait normal with good balance and coordination.  MSK:  Non tender with full range of motion and good stability and symmetric strength and tone of shoulders, elbows,  wrist, hip, knee and ankles bilaterally.  Back -    Osteopathic findings   C6 flexed rotated and side bent left T3 extended rotated and side bent right inhaled rib T9 extended rotated and side bent left L2 flexed rotated and side bent right Sacrum right on right       Assessment and Plan:  No problem-specific Assessment & Plan notes found for this encounter.    Nonallopathic problems  Decision today to treat with OMT was based on Physical Exam  After verbal consent patient was treated with HVLA, ME, FPR techniques in cervical, rib, thoracic, lumbar, and sacral  areas  Patient tolerated the procedure well with improvement in symptoms  Patient given exercises, stretches and lifestyle modifications  See medications in patient instructions if given  Patient will follow up in 4-8 weeks      The above documentation has been reviewed and is accurate and complete Lyndal Pulley, DO       Note: This dictation was prepared with Dragon dictation along with smaller phrase technology. Any transcriptional errors that result from this process are unintentional.

## 2021-02-24 ENCOUNTER — Ambulatory Visit: Payer: 59 | Admitting: Family Medicine

## 2021-03-01 ENCOUNTER — Other Ambulatory Visit: Payer: Self-pay | Admitting: Family Medicine

## 2021-03-15 ENCOUNTER — Encounter: Payer: Self-pay | Admitting: Family Medicine

## 2021-03-15 ENCOUNTER — Other Ambulatory Visit: Payer: Self-pay

## 2021-03-15 ENCOUNTER — Ambulatory Visit: Payer: 59 | Admitting: Family Medicine

## 2021-03-15 ENCOUNTER — Ambulatory Visit: Payer: Self-pay

## 2021-03-15 ENCOUNTER — Ambulatory Visit (INDEPENDENT_AMBULATORY_CARE_PROVIDER_SITE_OTHER): Payer: 59

## 2021-03-15 ENCOUNTER — Other Ambulatory Visit: Payer: Self-pay | Admitting: *Deleted

## 2021-03-15 VITALS — BP 106/70 | HR 106 | Ht 64.0 in | Wt 170.0 lb

## 2021-03-15 DIAGNOSIS — M25471 Effusion, right ankle: Secondary | ICD-10-CM | POA: Diagnosis not present

## 2021-03-15 DIAGNOSIS — M25571 Pain in right ankle and joints of right foot: Secondary | ICD-10-CM | POA: Diagnosis not present

## 2021-03-15 NOTE — Assessment & Plan Note (Signed)
Patient does have what appears to be more significant right ankle swelling.  Patient does have what appears to be a sprain.  Ultrasound as well as x-rays that were taken today and independently visualized by me do not show any true bony abnormality.  Discussed which activities to doing which wants to avoid.  CAM Walker given today as well as crutches to help her with the ambulation.  Patient already has follow-up in 48 hours and we will see how patient is responding with the idea of potentially transitioning to a Aircast at some point.

## 2021-03-15 NOTE — Patient Instructions (Signed)
Good to see you Ice 20 2 times a day Ibuprofen 3 pills 3 times a day for 3 days Keep ankle above heart See me again in Wednesday for manipulation and to check on the ankle

## 2021-03-15 NOTE — Progress Notes (Signed)
[place] Phone: [phone] Subjective:   [name], am serving as a scribe for Dr. Hulan Saas. This visit occurred during the SARS-CoV-2 public health emergency.  Safety protocols were in place, including screening questions prior to the visit, additional usage of staff PPE, and extensive cleaning of exam room while observing appropriate contact time as indicated for disinfecting solutions.   I'm seeing this patient by the request  of:  Bedsole, Amy E, MD  CC: Right ankle pain  [id number]  Crystal [name] is a 40 y.o. female coming in with complaint of left ankle pain. Patient slipped going down the stairs. Pain over lateral aspect. Is able to bear weight on medial aspect of foot.  Patient did trip on the stairs.  Has had swelling.  Having difficulty with ambulation secondary to the pain.  Denies any numbness.  She states that it is significantly painful.     Past Medical History:  Diagnosis Date  . Allergy   . Asthma    Past Surgical History:  Procedure Laterality Date  . HYSTEROSCOPY WITH NOVASURE N/A 05/23/2014   Procedure: Velvet Bathe;  Surgeon: Cheri Fowler, MD;  Location: [facility];  Service: Gynecology;  Laterality: N/A;  . SKIN CANCER EXCISION     Social History   Socioeconomic History  . Marital status: Married    Spouse name: Not on file  . Number of children: Not on file  . Years of education: Not on file  . Highest education level: Not on file  Occupational History  . Not on file  Tobacco Use  . Smoking status: Never Smoker  . Smokeless tobacco: Never Used  Substance and Sexual Activity  . Alcohol use: Yes  . Drug use: No  . Sexual activity: Yes  Other Topics Concern  . Not on file  Social History Narrative   Regular exercise- yes   Social Determinants of Health   Financial Resource Strain: Not on file  Food Insecurity: Not on file  Transportation Needs: Not on file   Physical Activity: Not on file  Stress: Not on file  Social Connections: Not on file   Allergies  Allergen Reactions  . Dextromethorphan Hbr Rash   Family History  Problem Relation Age of Onset  . Fibromyalgia Father   . Autoimmune disease Father   . Cancer Maternal Grandmother        stomach  . Diabetes Maternal Grandmother   . Diabetes Paternal Grandmother   . Heart disease Paternal Grandmother   . Alcohol abuse Paternal Grandfather   . Cancer Paternal Grandfather        lung  . Diabetes Mother       Current Outpatient Medications (Respiratory):  .  albuterol (VENTOLIN HFA) 108 (90 Base) MCG/ACT inhaler, Inhale 1-2 puffs into the lungs every 6 (six) hours as needed for wheezing or shortness of breath. Marland Kitchen  ipratropium (ATROVENT) 0.06 % nasal spray, Place 2 sprays into both nostrils 4 (four) times daily.    Current Outpatient Medications (Other):  .  amphetamine-dextroamphetamine (ADDERALL) 15 MG tablet, Take 15 mg by mouth daily. Marland Kitchen  gabapentin (NEURONTIN) 100 MG capsule, TAKE 2 CAPSULES (200 MG TOTAL) BY MOUTH AT BEDTIME. Marland Kitchen  Prenatal Vit-Fe Fumarate-FA (PRENATAL MULTIVITAMIN) TABS tablet, Take 1 tablet by mouth daily at 12 noon. .  venlafaxine XR (EFFEXOR-XR) 75 MG 24 hr capsule, TAKE 1 CAPSULE BY MOUTH DAILY WITH BREAKFAST. Marland Kitchen  Vitamin D, Ergocalciferol, (  DRISDOL) 1.25 MG (50000 UNIT) CAPS capsule, TAKE 1 CAPSULE (50,000 UNITS TOTAL) BY MOUTH EVERY 7 (SEVEN) DAYS   Reviewed prior external information including notes and imaging from  primary care provider As well as notes that were available from care everywhere and other healthcare systems.  Past medical history, social, surgical and family history all reviewed in electronic medical record.  No pertanent information unless stated regarding to the chief complaint.   Review of Systems:  No headache, visual changes, nausea, vomiting, diarrhea, constipation, dizziness, abdominal pain, skin rash, fevers, chills, night  sweats, weight loss, swollen lymph nodes, body aches,chest pain, shortness of breath, mood changes. POSITIVE muscle aches, joint swelling  Objective  Blood pressure 106/70, pulse (!) 106, height 5\' 4"  (1.626 m), weight 170 lb (77.1 kg), last menstrual period 03/06/2021, SpO2 98 %, unknown if currently breastfeeding.   General: No apparent distress alert and oriented x3 mood and affect normal, dressed appropriately.  HEENT: Pupils equal, extraocular movements intact  Respiratory: Patient's speak in full sentences and does not appear short of breath  Cardiovascular: No lower extremity edema, non tender, no erythema  Gait severely antalgic Right ankle exam shows the patient does have significant swelling on the lateral aspect of the ankle.  Tender to palpation laterally over the lateral malleolus.  Patient though negative pain in the calf.  Patient is neurovascularly intact distally.  Does have swelling on the anterior lateral aspect of the foot.  Limited musculoskeletal ultrasound was performed and interpreted by Lyndal Pulley  Limited ultrasound of patient's right ankle shows there is significant soft tissue swelling noted.  Difficulty to see the ATFL with a possible tear noted.  Patient's though lateral malleolus appears to be unremarkable.  No signs of any fracture of the fifth metatarsal noted. Impression: Likely severe ankle sprain   Impression and Recommendations:     The above documentation has been reviewed and is accurate and complete Lyndal Pulley, DO

## 2021-03-17 ENCOUNTER — Encounter: Payer: Self-pay | Admitting: Family Medicine

## 2021-03-17 ENCOUNTER — Other Ambulatory Visit: Payer: Self-pay

## 2021-03-17 ENCOUNTER — Ambulatory Visit: Payer: 59 | Admitting: Family Medicine

## 2021-03-17 VITALS — BP 122/68 | HR 95 | Ht 64.0 in | Wt 170.0 lb

## 2021-03-17 DIAGNOSIS — M533 Sacrococcygeal disorders, not elsewhere classified: Secondary | ICD-10-CM

## 2021-03-17 DIAGNOSIS — M999 Biomechanical lesion, unspecified: Secondary | ICD-10-CM | POA: Diagnosis not present

## 2021-03-17 NOTE — Patient Instructions (Signed)
Ankle exercises Boot thru weekend then shoe in house for one week Then out of boot after that Move ankle when sitting Arnica lotion and ice for swelling See me in 4-5 weeks

## 2021-03-17 NOTE — Progress Notes (Signed)
[place] Phone: [phone] Subjective:   [name], am serving as a scribe for Dr. Hulan Saas. This visit occurred during the SARS-CoV-2 public health emergency.  Safety protocols were in place, including screening questions prior to the visit, additional usage of staff PPE, and extensive cleaning of exam room while observing appropriate contact time as indicated for disinfecting solutions.   I'm seeing this patient by the request  of:  Bedsole, Amy E, MD  CC: Ankle pain follow-up, back pain follow-up  [id number]   03/15/2021 Patient does have what appears to be more significant right ankle swelling.  Patient does have what appears to be a sprain.  Ultrasound as well as x-rays that were taken today and independently visualized by me do not show any true bony abnormality.  Discussed which activities to doing which wants to avoid.  CAM Walker given today as well as crutches to help her with the ambulation.  Patient already has follow-up in 48 hours and we will see how patient is responding with the idea of potentially transitioning to a Aircast at some point.  Update 03/17/2021 Gaile Allmon Teigen is a 40 y.o. female coming in with complaint of right ankle pain.  Patient was found to have more of a sprain.  Patient states it is significantly getting better.  Has been able to put weight on her ankle now at this time.  Continue to wear the cam walker at all.  Denies any new symptoms.    Past Medical History:  Diagnosis Date  . Allergy   . Asthma    Past Surgical History:  Procedure Laterality Date  . HYSTEROSCOPY WITH NOVASURE N/A 05/23/2014   Procedure: Velvet Bathe;  Surgeon: Cheri Fowler, MD;  Location: [facility];  Service: Gynecology;  Laterality: N/A;  . SKIN CANCER EXCISION     Social History   Socioeconomic History  . Marital status: Married    Spouse name: Not on file  . Number of children: Not on  file  . Years of education: Not on file  . Highest education level: Not on file  Occupational History  . Not on file  Tobacco Use  . Smoking status: Never Smoker  . Smokeless tobacco: Never Used  Substance and Sexual Activity  . Alcohol use: Yes  . Drug use: No  . Sexual activity: Yes  Other Topics Concern  . Not on file  Social History Narrative   Regular exercise- yes   Social Determinants of Health   Financial Resource Strain: Not on file  Food Insecurity: Not on file  Transportation Needs: Not on file  Physical Activity: Not on file  Stress: Not on file  Social Connections: Not on file   Allergies  Allergen Reactions  . Dextromethorphan Hbr Rash   Family History  Problem Relation Age of Onset  . Fibromyalgia Father   . Autoimmune disease Father   . Cancer Maternal Grandmother        stomach  . Diabetes Maternal Grandmother   . Diabetes Paternal Grandmother   . Heart disease Paternal Grandmother   . Alcohol abuse Paternal Grandfather   . Cancer Paternal Grandfather        lung  . Diabetes Mother       Current Outpatient Medications (Respiratory):  .  albuterol (VENTOLIN HFA) 108 (90 Base) MCG/ACT inhaler, Inhale 1-2 puffs into the lungs every 6 (six) hours as needed for wheezing or shortness of  breath. Marland Kitchen  ipratropium (ATROVENT) 0.06 % nasal spray, Place 2 sprays into both nostrils 4 (four) times daily.    Current Outpatient Medications (Other):  .  amphetamine-dextroamphetamine (ADDERALL) 15 MG tablet, Take 15 mg by mouth daily. Marland Kitchen  gabapentin (NEURONTIN) 100 MG capsule, TAKE 2 CAPSULES (200 MG TOTAL) BY MOUTH AT BEDTIME. Marland Kitchen  Prenatal Vit-Fe Fumarate-FA (PRENATAL MULTIVITAMIN) TABS tablet, Take 1 tablet by mouth daily at 12 noon. .  venlafaxine XR (EFFEXOR-XR) 75 MG 24 hr capsule, TAKE 1 CAPSULE BY MOUTH DAILY WITH BREAKFAST. Marland Kitchen  Vitamin D, Ergocalciferol, (DRISDOL) 1.25 MG (50000 UNIT) CAPS capsule, TAKE 1 CAPSULE (50,000 UNITS TOTAL) BY MOUTH EVERY 7  (SEVEN) DAYS   Reviewed prior external information including notes and imaging from  primary care provider As well as notes that were available from care everywhere and other healthcare systems.  Past medical history, social, surgical and family history all reviewed in electronic medical record.  No pertanent information unless stated regarding to the chief complaint.   Review of Systems:  No headache, visual changes, nausea, vomiting, diarrhea, constipation, dizziness, abdominal pain, skin rash, fevers, chills, night sweats, weight loss, swollen lymph nodes, body aches, joint swelling, chest pain, shortness of breath, mood changes. POSITIVE muscle aches  Objective  Blood pressure 122/68, pulse 95, height 5\' 4"  (1.626 m), weight 170 lb (77.1 kg), last menstrual period 03/06/2021, SpO2 99 %, unknown if currently breastfeeding.   General: No apparent distress alert and oriented x3 mood and affect normal, dressed appropriately.  HEENT: Pupils equal, extraocular movements intact  Respiratory: Patient's speak in full sentences and does not appear short of breath  Cardiovascular: No lower extremity edema, non tender, no erythema  Gait normal with good balance and coordination.  MSK:   Right ankle exam does still have some mild swelling and patient does have some resolving bruising on the lateral aspect of the ankle.  Still has pain with testing of the ATFL.  No pain over the medial or lateral malleolus.  No calf pain.  Good range of motion of the ankle noted.  Low back exam does have some mild loss of lordosis.  Tightness around the right sacroiliac joint.  Tightness in the left parascapular region as well.  Osteopathic findings C6 flexed rotated and side bent left T3 extended rotated and side bent left inhaled third rib T9 extended rotated and side bent left L2 flexed rotated and side bent right Sacrum right on right     Impression and Recommendations:     The above documentation has  been reviewed and is accurate and complete Lyndal Pulley, DO

## 2021-03-17 NOTE — Assessment & Plan Note (Signed)
Right-sided, chronic problem with worsening symptoms at this moment.  Discussed anti-inflammatories.  Likely secondary to patient having to walk in the cam walker for the ankle.  Discussed home exercises, core strengthening.  Follow-up again in 4 weeks

## 2021-03-24 ENCOUNTER — Ambulatory Visit: Payer: 59 | Admitting: Family Medicine

## 2021-04-11 ENCOUNTER — Encounter: Payer: Self-pay | Admitting: Family Medicine

## 2021-04-12 MED ORDER — VENLAFAXINE HCL ER 75 MG PO CP24: ORAL_CAPSULE | ORAL | 0 refills | Status: DC

## 2021-04-19 NOTE — Progress Notes (Signed)
[place] [address] [place] Phone: [phone] Subjective:   I Crystal [name] am serving as a Education administrator for Dr. Hulan Saas.  This visit occurred during the SARS-CoV-2 public health emergency.  Safety protocols were in place, including screening questions prior to the visit, additional usage of staff PPE, and extensive cleaning of exam room while observing appropriate contact time as indicated for disinfecting solutions.   I'm seeing this patient by the request  of:  Bedsole, Amy E, MD  CC: Upper back and neck pain  [id number]  Crystal [name] is a 40 y.o. female coming in with complaint of back and neck pain. R ankle pain. OMT 03/17/2021. Patient states her back and neck is painful. Ankle is sore still.  Patient states that the ankle is not stopping her from anything.  Patient did have some time at her [place] but is feeling really good.  He did do a lot of water sports that could have contributed to some of it.  [place]els like [place] very [place].  Could be contributing to some of the pain as well mostly in the upper back and neck today.  Medications patient has been prescribed: Vit D, Effexor Taking:         Reviewed prior external information including notes and imaging from previsou exam, outside providers and external EMR if available.   As well as notes that were available from care everywhere and other healthcare systems.  Past medical history, social, surgical and family history all reviewed in electronic medical record.  No pertanent information unless stated regarding to the chief complaint.   Past Medical History:  Diagnosis Date  . Allergy   . Asthma     Allergies  Allergen Reactions  . Dextromethorphan Hbr Rash     Review of Systems:  No headache, visual changes, nausea, vomiting, diarrhea, constipation, dizziness, abdominal pain, skin rash, fevers, chills, night sweats, weight loss,  swollen lymph nodes, body aches, joint swelling, chest pain, shortness of breath, mood changes. POSITIVE muscle aches  Objective  Blood pressure 124/90, pulse 93, height 5\' 4"  (1.626 m), weight 174 lb (78.9 kg), SpO2 97 %, unknown if currently breastfeeding.   General: No apparent distress alert and oriented x3 mood and affect normal, dressed appropriately.  HEENT: Pupils equal, extraocular movements intact  Respiratory: Patient's speak in full sentences and does not appear short of breath  Cardiovascular: No lower extremity edema, non tender, no erythema  Gait normal with good balance and coordination.  MSK:  Non tender with full range of motion and good stability and symmetric strength and tone of shoulders, elbows, wrist, hip, knee bilaterally.  Right ankle does have some mild swelling on the lateral aspect.  No significant no instability noted.  Patient is very minorly tender over the ATFL.  Negative anterior drawer test.  Neck exam does have some mild loss of lordosis.  Some tenderness to palpation in the paraspinal musculature more in the scapular region right greater than left.  Tightness noted on the right side of the thoracolumbar juncture is also noted.    Osteopathic findings  C4 flexed rotated and side bent right T9 extended rotated and side bent right with inhaled rib L1 flexed rotated and side bent right Sacrum right on right       Assessment and Plan:  Cervical paraspinal muscle spasm Chronic, with mild exacerbation.  Does have the gabapentin.  Patient likely has  some mild stress as well as been doing a lot of the aquatic exercises.  Patient is going to increase activity slowly.  Follow-up with me again in 6 to 8 weeks no significant change in medication.  Continue the Effexor for now as well as the gabapentin  Right ankle swelling Patient did have an ankle sprain.  Seems to be doing better overall.  Still with trace amount of fluid.  Patient though is improving she  thinks regularly.  Follow-up with me again in 6 weeks    Nonallopathic problems  Decision today to treat with OMT was based on Physical Exam  After verbal consent patient was treated with HVLA, ME, FPR techniques in cervical, rib, thoracic, lumbar, and sacral  areas  Patient tolerated the procedure well with improvement in symptoms  Patient given exercises, stretches and lifestyle modifications  See medications in patient instructions if given  Patient will follow up in 4-8 weeks      The above documentation has been reviewed and is accurate and complete Crystal Pulley, DO       Note: This dictation was prepared with Dragon dictation along with smaller phrase technology. Any transcriptional errors that result from this process are unintentional.

## 2021-04-20 ENCOUNTER — Other Ambulatory Visit: Payer: Self-pay

## 2021-04-20 ENCOUNTER — Encounter: Payer: Self-pay | Admitting: Family Medicine

## 2021-04-20 ENCOUNTER — Ambulatory Visit: Payer: 59 | Admitting: Family Medicine

## 2021-04-20 VITALS — BP 124/90 | HR 93 | Ht 64.0 in | Wt 174.0 lb

## 2021-04-20 DIAGNOSIS — M9902 Segmental and somatic dysfunction of thoracic region: Secondary | ICD-10-CM | POA: Diagnosis not present

## 2021-04-20 DIAGNOSIS — M25471 Effusion, right ankle: Secondary | ICD-10-CM

## 2021-04-20 DIAGNOSIS — M9904 Segmental and somatic dysfunction of sacral region: Secondary | ICD-10-CM

## 2021-04-20 DIAGNOSIS — M9903 Segmental and somatic dysfunction of lumbar region: Secondary | ICD-10-CM | POA: Diagnosis not present

## 2021-04-20 DIAGNOSIS — M62838 Other muscle spasm: Secondary | ICD-10-CM

## 2021-04-20 DIAGNOSIS — M9908 Segmental and somatic dysfunction of rib cage: Secondary | ICD-10-CM | POA: Diagnosis not present

## 2021-04-20 DIAGNOSIS — M9901 Segmental and somatic dysfunction of cervical region: Secondary | ICD-10-CM | POA: Diagnosis not present

## 2021-04-20 NOTE — Patient Instructions (Signed)
Good to see you [place] Tell [place] See me again in 6 weeks

## 2021-04-20 NOTE — Assessment & Plan Note (Signed)
Patient did have an ankle sprain.  Seems to be doing better overall.  Still with trace amount of fluid.  Patient though is improving she thinks regularly.  Follow-up with me again in 6 weeks

## 2021-04-20 NOTE — Assessment & Plan Note (Signed)
Chronic, with mild exacerbation.  Does have the gabapentin.  Patient likely has some mild stress as well as been doing a lot of the aquatic exercises.  Patient is going to increase activity slowly.  Follow-up with me again in 6 to 8 weeks no significant change in medication.  Continue the Effexor for now as well as the gabapentin

## 2021-05-04 ENCOUNTER — Other Ambulatory Visit: Payer: Self-pay | Admitting: Family Medicine

## 2021-05-19 NOTE — Progress Notes (Deleted)
  [place] Phone: [phone] Subjective:    I'm seeing this patient by the request  of:  Jinny Sanders, MD  CC:   [id number]  Crystal [name] is a 40 y.o. female coming in with complaint of back and neck pain. OTM 04/20/2021. Patient states   Medications patient has been prescribed: Effexor   Taking:         Reviewed prior external information including notes and imaging from previsou exam, outside providers and external EMR if available.   As well as notes that were available from care everywhere and other healthcare systems.  Past medical history, social, surgical and family history all reviewed in electronic medical record.  No pertanent information unless stated regarding to the chief complaint.   Past Medical History:  Diagnosis Date  . Allergy   . Asthma     Allergies  Allergen Reactions  . Dextromethorphan Hbr Rash     Review of Systems:  No headache, visual changes, nausea, vomiting, diarrhea, constipation, dizziness, abdominal pain, skin rash, fevers, chills, night sweats, weight loss, swollen lymph nodes, body aches, joint swelling, chest pain, shortness of breath, mood changes. POSITIVE muscle aches  Objective  unknown if currently breastfeeding.   General: No apparent distress alert and oriented x3 mood and affect normal, dressed appropriately.  HEENT: Pupils equal, extraocular movements intact  Respiratory: Patient's speak in full sentences and does not appear short of breath  Cardiovascular: No lower extremity edema, non tender, no erythema  Neuro: Cranial nerves II through XII are intact, neurovascularly intact in all extremities with 2+ DTRs and 2+ pulses.  Gait normal with good balance and coordination.  MSK:  Non tender with full range of motion and good stability and symmetric strength and tone of shoulders, elbows, wrist, hip, knee and ankles bilaterally.  Back - Normal  skin, Spine with normal alignment and no deformity.  No tenderness to vertebral process palpation.  Paraspinous muscles are not tender and without spasm.   Range of motion is full at neck and lumbar sacral regions  Osteopathic findings  C2 flexed rotated and side bent right C6 flexed rotated and side bent left T3 extended rotated and side bent right inhaled rib T9 extended rotated and side bent left L2 flexed rotated and side bent right Sacrum right on right       Assessment and Plan:    Nonallopathic problems  Decision today to treat with OMT was based on Physical Exam  After verbal consent patient was treated with HVLA, ME, FPR techniques in cervical, rib, thoracic, lumbar, and sacral  areas  Patient tolerated the procedure well with improvement in symptoms  Patient given exercises, stretches and lifestyle modifications  See medications in patient instructions if given  Patient will follow up in 4-8 weeks      The above documentation has been reviewed and is accurate and complete Jacqualin Combes       Note: This dictation was prepared with Dragon dictation along with smaller phrase technology. Any transcriptional errors that result from this process are unintentional.

## 2021-05-20 ENCOUNTER — Ambulatory Visit: Payer: 59 | Admitting: Family Medicine

## 2021-05-29 ENCOUNTER — Other Ambulatory Visit: Payer: Self-pay | Admitting: Family Medicine

## 2021-06-16 NOTE — Progress Notes (Signed)
  [place] Phone: [phone] Subjective:    I'm seeing this patient by the request  of:  Bedsole, Amy E, MD  CC: Back and neck pain   [id number]  Crystal [name] is a 40 y.o. female coming in with complaint of back and neck pain. OMT 04/20/2021. Patient states overall some mild tightness.  Patient continues to attempt to lose weight but finds it difficult.  Tries to be active.  Medications patient has been prescribed: Effexor, Vit D  Taking: yes          Reviewed prior external information including notes and imaging from previsou exam, outside providers and external EMR if available.   As well as notes that were available from care everywhere and other healthcare systems.  Past medical history, social, surgical and family history all reviewed in electronic medical record.  No pertanent information unless stated regarding to the chief complaint.   Past Medical History:  Diagnosis Date   Allergy    Asthma     Allergies  Allergen Reactions   Dextromethorphan Hbr Rash     Review of Systems:  No headache, visual changes, nausea, vomiting, diarrhea, constipation, dizziness, abdominal pain, skin rash, fevers, chills, night sweats, weight loss, swollen lymph nodes, body aches, joint swelling, chest pain, shortness of breath, mood changes. POSITIVE muscle aches  Objective  unknown if currently breastfeeding.   General: No apparent distress alert and oriented x3 mood and affect normal, dressed appropriately.  HEENT: Pupils equal, extraocular movements intact  Respiratory: Patient's speak in full sentences and does not appear short of breath  Cardiovascular: No lower extremity edema, non tender, no erythema  Low back exam does have tightness noted in the thoracolumbar juncture.  Patient does have mild tightness of the sacroiliac joints bilaterally as well with positive Corky Sox.  Negative straight leg  test.  Osteopathic findings  C5 flexed rotated and side bent right T3 extended rotated and side bent right inhaled rib T7 extended rotated and side bent left L2 flexed rotated and side bent right Sacrum right on right     Assessment and Plan:  SI (sacroiliac) joint dysfunction Discussed HEP  Discussed weight loss, declined med change with the Effexor and we discussed the potential of decreasing the patient feels that it is helping her with other things.  Patient will follow up with me again in 6 weeks.   Nonallopathic problems  Decision today to treat with OMT was based on Physical Exam  After verbal consent patient was treated with HVLA, ME, FPR techniques in cervical, rib, thoracic, lumbar, and sacral  areas  Patient tolerated the procedure well with improvement in symptoms  Patient given exercises, stretches and lifestyle modifications  See medications in patient instructions if given  Patient will follow up in 4-8 weeks     The above documentation has been reviewed and is accurate and complete Lyndal Pulley, DO        Note: This dictation was prepared with Dragon dictation along with smaller phrase technology. Any transcriptional errors that result from this process are unintentional.

## 2021-06-17 ENCOUNTER — Ambulatory Visit: Payer: 59 | Admitting: Family Medicine

## 2021-06-17 ENCOUNTER — Other Ambulatory Visit: Payer: Self-pay

## 2021-06-17 ENCOUNTER — Encounter: Payer: Self-pay | Admitting: Family Medicine

## 2021-06-17 DIAGNOSIS — M9904 Segmental and somatic dysfunction of sacral region: Secondary | ICD-10-CM

## 2021-06-17 DIAGNOSIS — M9902 Segmental and somatic dysfunction of thoracic region: Secondary | ICD-10-CM

## 2021-06-17 DIAGNOSIS — M533 Sacrococcygeal disorders, not elsewhere classified: Secondary | ICD-10-CM

## 2021-06-17 DIAGNOSIS — M9903 Segmental and somatic dysfunction of lumbar region: Secondary | ICD-10-CM | POA: Diagnosis not present

## 2021-06-17 DIAGNOSIS — M9901 Segmental and somatic dysfunction of cervical region: Secondary | ICD-10-CM

## 2021-06-17 DIAGNOSIS — M9908 Segmental and somatic dysfunction of rib cage: Secondary | ICD-10-CM | POA: Diagnosis not present

## 2021-06-17 NOTE — Patient Instructions (Signed)
Great to see you  Make the diet changes you suggested No overhand lifting Stay active and enjoy summer See me again in 6 weeks

## 2021-06-17 NOTE — Assessment & Plan Note (Signed)
Discussed HEP  Discussed weight loss, declined med change with the Effexor and we discussed the potential of decreasing the patient feels that it is helping her with other things.  Patient will follow up with me again in 6 weeks.

## 2021-07-21 NOTE — Progress Notes (Deleted)
  [place] Phone: [phone] Subjective:    I'm seeing this patient by the request  of:  Jinny Sanders, MD  CC:   [id number]  Crystal [name] is a 40 y.o. female coming in with complaint of back and neck pain. OMT 06/17/2021. Patient states   Medications patient has been prescribed: Vit D, Effexor  Taking:         Reviewed prior external information including notes and imaging from previsou exam, outside providers and external EMR if available.   As well as notes that were available from care everywhere and other healthcare systems.  Past medical history, social, surgical and family history all reviewed in electronic medical record.  No pertanent information unless stated regarding to the chief complaint.   Past Medical History:  Diagnosis Date   Allergy    Asthma     Allergies  Allergen Reactions   Dextromethorphan Hbr Rash     Review of Systems:  No headache, visual changes, nausea, vomiting, diarrhea, constipation, dizziness, abdominal pain, skin rash, fevers, chills, night sweats, weight loss, swollen lymph nodes, body aches, joint swelling, chest pain, shortness of breath, mood changes. POSITIVE muscle aches  Objective  unknown if currently breastfeeding.   General: No apparent distress alert and oriented x3 mood and affect normal, dressed appropriately.  HEENT: Pupils equal, extraocular movements intact  Respiratory: Patient's speak in full sentences and does not appear short of breath  Cardiovascular: No lower extremity edema, non tender, no erythema  Neuro: Cranial nerves II through XII are intact, neurovascularly intact in all extremities with 2+ DTRs and 2+ pulses.  Gait normal with good balance and coordination.  MSK:  Non tender with full range of motion and good stability and symmetric strength and tone of shoulders, elbows, wrist, hip, knee and ankles bilaterally.  Back -  Normal skin, Spine with normal alignment and no deformity.  No tenderness to vertebral process palpation.  Paraspinous muscles are not tender and without spasm.   Range of motion is full at neck and lumbar sacral regions  Osteopathic findings  C2 flexed rotated and side bent right C6 flexed rotated and side bent left T3 extended rotated and side bent right inhaled rib T9 extended rotated and side bent left L2 flexed rotated and side bent right Sacrum right on right       Assessment and Plan:    Nonallopathic problems  Decision today to treat with OMT was based on Physical Exam  After verbal consent patient was treated with HVLA, ME, FPR techniques in cervical, rib, thoracic, lumbar, and sacral  areas  Patient tolerated the procedure well with improvement in symptoms  Patient given exercises, stretches and lifestyle modifications  See medications in patient instructions if given  Patient will follow up in 4-8 weeks      The above documentation has been reviewed and is accurate and complete Jacqualin Combes       Note: This dictation was prepared with Dragon dictation along with smaller phrase technology. Any transcriptional errors that result from this process are unintentional.

## 2021-07-22 ENCOUNTER — Ambulatory Visit: Payer: 59 | Admitting: Family Medicine

## 2021-09-16 ENCOUNTER — Ambulatory Visit: Payer: 59 | Admitting: Family Medicine

## 2021-09-16 NOTE — Progress Notes (Deleted)
  [place] Phone: [phone] Subjective:    I'm seeing this patient by the request  of:  Jinny Sanders, MD  CC:   [id number]  Crystal [name] is a 40 y.o. female coming in with complaint of back and neck pain. OMT on 06/17/2021. Patient states   Medications patient has been prescribed: Vit D  Taking:         Reviewed prior external information including notes and imaging from previsou exam, outside providers and external EMR if available.   As well as notes that were available from care everywhere and other healthcare systems.  Past medical history, social, surgical and family history all reviewed in electronic medical record.  No pertanent information unless stated regarding to the chief complaint.   Past Medical History:  Diagnosis Date   Allergy    Asthma     Allergies  Allergen Reactions   Dextromethorphan Hbr Rash     Review of Systems:  No headache, visual changes, nausea, vomiting, diarrhea, constipation, dizziness, abdominal pain, skin rash, fevers, chills, night sweats, weight loss, swollen lymph nodes, body aches, joint swelling, chest pain, shortness of breath, mood changes. POSITIVE muscle aches  Objective  unknown if currently breastfeeding.   General: No apparent distress alert and oriented x3 mood and affect normal, dressed appropriately.  HEENT: Pupils equal, extraocular movements intact  Respiratory: Patient's speak in full sentences and does not appear short of breath  Cardiovascular: No lower extremity edema, non tender, no erythema  Neuro: Cranial nerves II through XII are intact, neurovascularly intact in all extremities with 2+ DTRs and 2+ pulses.  Gait normal with good balance and coordination.  MSK:  Non tender with full range of motion and good stability and symmetric strength and tone of shoulders, elbows, wrist, hip, knee and ankles bilaterally.  Back - Normal  skin, Spine with normal alignment and no deformity.  No tenderness to vertebral process palpation.  Paraspinous muscles are not tender and without spasm.   Range of motion is full at neck and lumbar sacral regions  Osteopathic findings  C2 flexed rotated and side bent right C6 flexed rotated and side bent left T3 extended rotated and side bent right inhaled rib T9 extended rotated and side bent left L2 flexed rotated and side bent right Sacrum right on right       Assessment and Plan:    Nonallopathic problems  Decision today to treat with OMT was based on Physical Exam  After verbal consent patient was treated with HVLA, ME, FPR techniques in cervical, rib, thoracic, lumbar, and sacral  areas  Patient tolerated the procedure well with improvement in symptoms  Patient given exercises, stretches and lifestyle modifications  See medications in patient instructions if given  Patient will follow up in 4-8 weeks      The above documentation has been reviewed and is accurate and complete Lyndal Pulley, DO       Note: This dictation was prepared with Dragon dictation along with smaller phrase technology. Any transcriptional errors that result from this process are unintentional.

## 2021-09-20 ENCOUNTER — Encounter: Payer: Self-pay | Admitting: Family Medicine

## 2021-10-01 ENCOUNTER — Ambulatory Visit: Payer: 59 | Admitting: Family Medicine

## 2021-10-12 NOTE — Progress Notes (Deleted)
  [place] Phone: [phone] Subjective:    I'm seeing this patient by the request  of:  Jinny Sanders, MD  CC:   [id number]  Crystal [name] is a 40 y.o. female coming in with complaint of back and neck pain. OMT on 06/17/2021. Patient states   Medications patient has been prescribed: Vit D  Taking:         Reviewed prior external information including notes and imaging from previsou exam, outside providers and external EMR if available.   As well as notes that were available from care everywhere and other healthcare systems.  Past medical history, social, surgical and family history all reviewed in electronic medical record.  No pertanent information unless stated regarding to the chief complaint.   Past Medical History:  Diagnosis Date   Allergy    Asthma     Allergies  Allergen Reactions   Dextromethorphan Hbr Rash     Review of Systems:  No headache, visual changes, nausea, vomiting, diarrhea, constipation, dizziness, abdominal pain, skin rash, fevers, chills, night sweats, weight loss, swollen lymph nodes, body aches, joint swelling, chest pain, shortness of breath, mood changes. POSITIVE muscle aches  Objective  unknown if currently breastfeeding.   General: No apparent distress alert and oriented x3 mood and affect normal, dressed appropriately.  HEENT: Pupils equal, extraocular movements intact  Respiratory: Patient's speak in full sentences and does not appear short of breath  Cardiovascular: No lower extremity edema, non tender, no erythema  Neuro: Cranial nerves II through XII are intact, neurovascularly intact in all extremities with 2+ DTRs and 2+ pulses.  Gait normal with good balance and coordination.  MSK:  Non tender with full range of motion and good stability and symmetric strength and tone of shoulders, elbows, wrist, hip, knee and ankles bilaterally.  Back - Normal  skin, Spine with normal alignment and no deformity.  No tenderness to vertebral process palpation.  Paraspinous muscles are not tender and without spasm.   Range of motion is full at neck and lumbar sacral regions  Osteopathic findings  C2 flexed rotated and side bent right C6 flexed rotated and side bent left T3 extended rotated and side bent right inhaled rib T9 extended rotated and side bent left L2 flexed rotated and side bent right Sacrum right on right       Assessment and Plan:    Nonallopathic problems  Decision today to treat with OMT was based on Physical Exam  After verbal consent patient was treated with HVLA, ME, FPR techniques in cervical, rib, thoracic, lumbar, and sacral  areas  Patient tolerated the procedure well with improvement in symptoms  Patient given exercises, stretches and lifestyle modifications  See medications in patient instructions if given  Patient will follow up in 4-8 weeks      The above documentation has been reviewed and is accurate and complete Crystal [name]       Note: This dictation was prepared with Diplomatic Services operational officer dictation along with smaller Company secretary. Any transcriptional errors that result from this process are unintentional.

## 2021-10-13 ENCOUNTER — Encounter: Payer: Self-pay | Admitting: Family Medicine

## 2021-10-13 ENCOUNTER — Ambulatory Visit: Payer: 59 | Admitting: Family Medicine

## 2021-12-11 ENCOUNTER — Other Ambulatory Visit: Payer: Self-pay | Admitting: Family Medicine

## 2022-02-01 NOTE — Progress Notes (Signed)
[place] Phone: [phone] Subjective:   Crystal Crystal [name], am serving as a scribe for Dr. Hulan Saas.This visit occurred during the SARS-CoV-2 public health emergency.  Safety protocols were in place, including screening questions prior to the visit, additional usage of staff PPE, and extensive cleaning of exam room while observing appropriate contact time as indicated for disinfecting solutions.   I'm seeing this patient by the request  of:  Bedsole, Amy E, MD  CC: Neck and back pain follow-up  [id number]  Crystal Crystal [name] is a 41 y.o. female coming in with complaint of back and neck pain. OMT 06/17/2021. Patient states that her R hip flexor is bothering her.   Also c/o L elbow pain over lateral epicondyle. Had similar issue with R elbow. Pain comes and goes for past month.   Medications patient has been prescribed: Effexor  Taking:         Reviewed prior external information including notes and imaging from previsou exam, outside providers and external EMR if available.   As well as notes that were available from care everywhere and other healthcare systems.  Past medical history, social, surgical and family history all reviewed in electronic medical record.  Crystal [name] pertanent information unless stated regarding to the chief complaint.   Past Medical History:  Diagnosis Date   Allergy    Asthma     Allergies  Allergen Reactions   Dextromethorphan Hbr Rash     Review of Systems:  Crystal [name] headache, visual changes, nausea, vomiting, diarrhea, constipation, dizziness, abdominal pain, skin rash, fevers, chills, night sweats, weight loss, swollen lymph nodes, body aches, joint swelling, chest pain, shortness of breath, mood changes. POSITIVE muscle aches  Objective  Blood pressure 104/74, pulse (!) 103, height 5\' 4"  (1.626 m), weight 175 lb (79.4 kg), SpO2 98 %, unknown if currently breastfeeding.    General: Crystal [name] apparent distress alert and oriented x3 mood and affect normal, dressed appropriately.  HEENT: Pupils equal, extraocular movements intact  Respiratory: Patient's speak in full sentences and does not appear short of breath  Cardiovascular: Crystal [name] lower extremity edema, non tender, Crystal [name] erythema  Neck injury does have some loss of lordosis. Tenderness to palpation of the paraspinal musculature. Patient does have severe tightness noted in the right sacroiliac joint.  Positive Faber with some decreased range of motion.  Worsening pain with extension of the back.  Osteopathic findings  C3 flexed rotated and side bent right C7 flexed rotated and side bent left T3 extended rotated and side bent right inhaled rib T6 extended rotated and side bent left L2 flexed rotated and side bent right Sacrum right on right Pelvic shear noted as well.      Assessment and Plan:  SI (sacroiliac) joint dysfunction Chronic problem with exacerbation.  Discussed the Effexor again in great length as well as the gabapentin.  Could consider the possibility of other medicines such as muscle relaxer.  Discussed icing regimen and home exercises.  We will continue to work on core strengthening.  We will follow-up again in 6 weeks   Nonallopathic problems  Decision today to treat with OMT was based on Physical Exam  After verbal consent patient was treated with HVLA, ME, FPR techniques in cervical, rib, thoracic, lumbar, and sacral  areas  Patient tolerated the procedure well with improvement in symptoms  Patient given exercises, stretches and lifestyle modifications  See medications in patient instructions if given  Patient will  follow up in 4-8 weeks      The above documentation has been reviewed and is accurate and complete Crystal Pulley, DO       Note: This dictation was prepared with Dragon dictation along with smaller phrase technology. Any transcriptional errors that result from this  process are unintentional.

## 2022-02-02 ENCOUNTER — Encounter: Payer: Self-pay | Admitting: Family Medicine

## 2022-02-02 ENCOUNTER — Other Ambulatory Visit: Payer: Self-pay

## 2022-02-02 ENCOUNTER — Ambulatory Visit: Payer: 59 | Admitting: Family Medicine

## 2022-02-02 VITALS — BP 104/74 | HR 103 | Ht 64.0 in | Wt 175.0 lb

## 2022-02-02 DIAGNOSIS — M9903 Segmental and somatic dysfunction of lumbar region: Secondary | ICD-10-CM

## 2022-02-02 DIAGNOSIS — M533 Sacrococcygeal disorders, not elsewhere classified: Secondary | ICD-10-CM | POA: Diagnosis not present

## 2022-02-02 DIAGNOSIS — M9904 Segmental and somatic dysfunction of sacral region: Secondary | ICD-10-CM

## 2022-02-02 DIAGNOSIS — M9908 Segmental and somatic dysfunction of rib cage: Secondary | ICD-10-CM

## 2022-02-02 DIAGNOSIS — M9902 Segmental and somatic dysfunction of thoracic region: Secondary | ICD-10-CM

## 2022-02-02 DIAGNOSIS — M9901 Segmental and somatic dysfunction of cervical region: Secondary | ICD-10-CM | POA: Diagnosis not present

## 2022-02-02 NOTE — Assessment & Plan Note (Signed)
Chronic problem with exacerbation.  Discussed the Effexor again in great length as well as the gabapentin.  Could consider the possibility of other medicines such as muscle relaxer.  Discussed icing regimen and home exercises.  We will continue to work on core strengthening.  We will follow-up again in 6 weeks

## 2022-02-02 NOTE — Patient Instructions (Signed)
Good to see you Stretch more regularly See me in 4-6 weeks

## 2022-03-01 NOTE — Progress Notes (Signed)
?Charlann Boxer D.O. ?[facility] ?[place] ?Phone: [phone] ?Subjective:   ?I, Jacqualin Combes, am serving as a scribe for Dr. Hulan Saas. ? ?This visit occurred during the SARS-CoV-2 public health emergency.  Safety protocols were in place, including screening questions prior to the visit, additional usage of staff PPE, and extensive cleaning of exam room while observing appropriate contact time as indicated for disinfecting solutions.  ?I'm seeing this patient by the request  of:  Bedsole, Amy E, MD ? ?CC: Neck and back pain follow-up ? ?[id number]  ?Crystal [name] is a 41 y.o. female coming in with complaint of back and neck pain. OMT 02/02/2022. Patient states that spot in R lumbar spine is painful and at its worst her leg and foot will be numb. Lumbar flexion increases her pain. Sometimes is unable to push up from floor with left leg. Feels that her back pain is worse with menstruation for past 3 months.  ? ?Medications patient has been prescribed: Effexor ? ?Taking: Yes ? ? ?  ? ? ? ? ?Reviewed prior external information including notes and imaging from previsou exam, outside providers and external EMR if available.  ? ?As well as notes that were available from care everywhere and other healthcare systems. ? ?Past medical history, social, surgical and family history all reviewed in electronic medical record.  No pertanent information unless stated regarding to the chief complaint.  ? ?Past Medical History:  ?Diagnosis Date  ? Allergy   ? Asthma   ?  ?Allergies  ?Allergen Reactions  ? Dextromethorphan Hbr Rash  ? ? ? ?Review of Systems: ? No headache, visual changes, nausea, vomiting, diarrhea, constipation, dizziness, abdominal pain, skin rash, fevers, chills, night sweats, weight loss, swollen lymph nodes, body aches, joint swelling, chest pain, shortness of breath, mood changes. POSITIVE muscle aches ? ?Objective  ?Blood pressure 110/78, pulse 98, height '5\' 4"'$   (1.626 m), weight 163 lb (73.9 kg), SpO2 98 %, unknown if currently breastfeeding. ?  ?General: No apparent distress alert and oriented x3 mood and affect normal, dressed appropriately.  ?HEENT: Pupils equal, extraocular movements intact  ?Respiratory: Patient's speak in full sentences and does not appear short of breath  ?Cardiovascular: No lower extremity edema, non tender, no erythema  ?Neck exam does have some loss of lordosis.  Patient does have severe tightness noted of the lower back.  Seems to be more around the right sacroiliac joint.  Does have a positive Corky Sox. ? ?Osteopathic findings ? ?C2 flexed rotated and side bent right ?C6 flexed rotated and side bent left ?T3 extended rotated and side bent right inhaled rib ?T6 extended rotated and side bent left ?L2 flexed rotated and side bent right ?Sacrum right on right ? ? ? ? ?  ?Assessment and Plan: ? ?SI (sacroiliac) joint dysfunction ?Worsening lower back pain.  Does seem to be somewhat associated with her menstruation.  Patient will be following up with her gynecologist on this part.  We discussed the possibility of iron supplementation that could be helpful.  Discussed which activities to do and which ones to avoid.  Discussed gabapentin which she has not been using regularly and does take Effexor which I think has been helpful though.  Follow-up again in 8 weeks. ?  ? ?Nonallopathic problems ? ?Decision today to treat with OMT was based on Physical Exam ? ?After verbal consent patient was treated with HVLA, ME, FPR techniques in cervical, rib, thoracic, lumbar, and sacral  areas ? ?Patient tolerated the procedure well with improvement in symptoms ? ?Patient given exercises, stretches and lifestyle modifications ? ?See medications in patient instructions if given ? ?Patient will follow up in 4-8 weeks ? ?  ? ? ?The above documentation has been reviewed and is accurate and complete Lyndal Pulley, DO ? ? ? ?  ? ? Note: This dictation was prepared with  Dragon dictation along with smaller phrase technology. Any transcriptional errors that result from this process are unintentional.    ?  ?  ? ?

## 2022-03-02 ENCOUNTER — Other Ambulatory Visit: Payer: Self-pay

## 2022-03-02 ENCOUNTER — Ambulatory Visit (INDEPENDENT_AMBULATORY_CARE_PROVIDER_SITE_OTHER): Payer: 59 | Admitting: Family Medicine

## 2022-03-02 VITALS — BP 110/78 | HR 98 | Ht 64.0 in | Wt 163.0 lb

## 2022-03-02 DIAGNOSIS — M9904 Segmental and somatic dysfunction of sacral region: Secondary | ICD-10-CM

## 2022-03-02 DIAGNOSIS — M9908 Segmental and somatic dysfunction of rib cage: Secondary | ICD-10-CM

## 2022-03-02 DIAGNOSIS — M9902 Segmental and somatic dysfunction of thoracic region: Secondary | ICD-10-CM

## 2022-03-02 DIAGNOSIS — M533 Sacrococcygeal disorders, not elsewhere classified: Secondary | ICD-10-CM

## 2022-03-02 DIAGNOSIS — M9903 Segmental and somatic dysfunction of lumbar region: Secondary | ICD-10-CM

## 2022-03-02 DIAGNOSIS — M9901 Segmental and somatic dysfunction of cervical region: Secondary | ICD-10-CM

## 2022-03-02 NOTE — Assessment & Plan Note (Signed)
Worsening lower back pain.  Does seem to be somewhat associated with her menstruation.  Patient will be following up with her gynecologist on this part.  We discussed the possibility of iron supplementation that could be helpful.  Discussed which activities to do and which ones to avoid.  Discussed gabapentin which she has not been using regularly and does take Effexor which I think has been helpful though.  Follow-up again in 8 weeks. ?

## 2022-03-02 NOTE — Patient Instructions (Addendum)
5 days before period start '65mg'$  of iron with '500mg'$  vit c take until 5 days after period ends ?Try to be active ?See you again in 2 months ?

## 2022-03-08 ENCOUNTER — Other Ambulatory Visit: Payer: Self-pay | Admitting: Family Medicine

## 2022-03-10 ENCOUNTER — Encounter: Payer: Self-pay | Admitting: Family Medicine

## 2022-03-10 ENCOUNTER — Other Ambulatory Visit: Payer: Self-pay | Admitting: Family Medicine

## 2022-03-10 ENCOUNTER — Other Ambulatory Visit: Payer: Self-pay

## 2022-03-15 ENCOUNTER — Ambulatory Visit
Admission: RE | Admit: 2022-03-15 | Discharge: 2022-03-15 | Disposition: A | Discharge status: Home / Self Care | Payer: 59 | Source: Ambulatory Visit | Attending: Family Medicine | Admitting: Family Medicine

## 2022-03-15 ENCOUNTER — Other Ambulatory Visit: Payer: Self-pay

## 2022-03-15 DIAGNOSIS — M545 Low back pain, unspecified: Secondary | ICD-10-CM

## 2022-03-17 ENCOUNTER — Other Ambulatory Visit: Payer: Self-pay

## 2022-03-17 DIAGNOSIS — M5459 Other low back pain: Secondary | ICD-10-CM

## 2022-03-22 ENCOUNTER — Other Ambulatory Visit: Payer: Self-pay | Admitting: Family Medicine

## 2022-03-22 ENCOUNTER — Other Ambulatory Visit: Payer: Self-pay

## 2022-03-22 ENCOUNTER — Ambulatory Visit
Admission: RE | Admit: 2022-03-22 | Discharge: 2022-03-22 | Disposition: A | Discharge status: Home / Self Care | Payer: 59 | Source: Ambulatory Visit | Attending: Family Medicine | Admitting: Family Medicine

## 2022-03-22 DIAGNOSIS — M5459 Other low back pain: Secondary | ICD-10-CM

## 2022-03-22 MED ORDER — IOPAMIDOL (ISOVUE-M 200) INJECTION 41%
1.0000 mL | Freq: Once | INTRAMUSCULAR | Status: AC
  Administered 2022-03-22: 1 mL via INTRA_ARTICULAR

## 2022-03-22 MED ORDER — METHYLPREDNISOLONE ACETATE 40 MG/ML INJ SUSP (RADIOLOG
80.0000 mg | Freq: Once | INTRAMUSCULAR | Status: AC
  Administered 2022-03-22: 80 mg via INTRA_ARTICULAR

## 2022-03-22 NOTE — Discharge Instructions (Signed)

## 2022-03-31 ENCOUNTER — Other Ambulatory Visit: Payer: Self-pay | Admitting: Obstetrics and Gynecology

## 2022-03-31 DIAGNOSIS — R928 Other abnormal and inconclusive findings on diagnostic imaging of breast: Secondary | ICD-10-CM

## 2022-05-03 ENCOUNTER — Ambulatory Visit: Payer: 59 | Admitting: Family Medicine

## 2022-05-25 NOTE — Progress Notes (Unsigned)
[place] Phone: [phone] Subjective:   Crystal Crystal [name], am serving as a scribe for Dr. Hulan Saas.   I'm seeing this patient by the request  of:  Bedsole, Amy E, MD  CC: Low back pain and neck pain follow-up.  [id number]  Crystal Crystal [name] is a 41 y.o. female coming in with complaint of back and neck pain. OMT 03/02/2022. Facet injections March 2023. Patient states that injection did help. Has flares that lasts for a day or two. Using [place] for pain relief. Frequency of spasms are less.  Recently did have a migraine.  Thinks it is secondary to other difficulties  Medications patient has been prescribed: Effexor, Vit D  Taking:         Reviewed prior external information including notes and imaging from previsou exam, outside providers and external EMR if available.   As well as notes that were available from care everywhere and other healthcare systems.  Past medical history, social, surgical and family history all reviewed in electronic medical record.  Crystal [name] pertanent information unless stated regarding to the chief complaint.   Past Medical History:  Diagnosis Date   Allergy    Asthma     Allergies  Allergen Reactions   Dextromethorphan Hbr Rash     Review of Systems:  Crystal [name]  visual changes, nausea, vomiting, diarrhea, constipation, dizziness, abdominal pain, skin rash, fevers, chills, night sweats, weight loss, swollen lymph nodes, body aches, joint swelling, chest pain, shortness of breath, mood changes. POSITIVE muscle aches, headache  Objective  Blood pressure 110/62, pulse (!) 107, height '5\' 4"'$  (1.626 m), weight 174 lb (78.9 kg), SpO2 98 %, unknown if currently breastfeeding.   General: Crystal [name] apparent distress alert and oriented x3 mood and affect normal, dressed appropriately.  HEENT: Pupils equal, extraocular movements intact  Respiratory: Patient's speak in full sentences and does not  appear short of breath  Cardiovascular: Crystal [name] lower extremity edema, non tender, Crystal [name] erythema  Gait normal with good balance and coordination.  MSK: Low back exam does have some loss of lordosis.  Tightness with FABER test left greater than right.  Some worsening pain with extension of the back.  Osteopathic findings  C2 flexed rotated and side bent right C5 flexed rotated and side bent left T3 extended rotated and side bent right inhaled rib T9 extended rotated and side bent left L1 flexed rotated and side bent right Sacrum left on left       Assessment and Plan:  SI (sacroiliac) joint dysfunction Patient also has some lumbar facet arthropathy but has responded well to injections previously.  Patient has done well with the Effexor at the moment and does not want to change any type of amount.  Chronic problem with mild exacerbation.  Mild worsening recently. Follow-up again in 6 to 8 weeks.    Nonallopathic problems  Decision today to treat with OMT was based on Physical Exam  After verbal consent patient was treated with HVLA, ME, FPR techniques in cervical, rib, thoracic, lumbar, and sacral  areas  Patient tolerated the procedure well with improvement in symptoms  Patient given exercises, stretches and lifestyle modifications  See medications in patient instructions if given  Patient will follow up in 4-8 weeks      The above documentation has been reviewed and is accurate and complete Crystal Pulley, DO        Note: This dictation was prepared with Viviann Spare  dictation along with smaller phrase technology. Any transcriptional errors that result from this process are unintentional.

## 2022-05-26 ENCOUNTER — Encounter: Payer: Self-pay | Admitting: Family Medicine

## 2022-05-26 ENCOUNTER — Ambulatory Visit: Payer: Managed Care, Other (non HMO) | Admitting: Family Medicine

## 2022-05-26 VITALS — BP 110/62 | HR 107 | Ht 64.0 in | Wt 174.0 lb

## 2022-05-26 DIAGNOSIS — M9903 Segmental and somatic dysfunction of lumbar region: Secondary | ICD-10-CM | POA: Diagnosis not present

## 2022-05-26 DIAGNOSIS — M9902 Segmental and somatic dysfunction of thoracic region: Secondary | ICD-10-CM | POA: Diagnosis not present

## 2022-05-26 DIAGNOSIS — M9901 Segmental and somatic dysfunction of cervical region: Secondary | ICD-10-CM | POA: Diagnosis not present

## 2022-05-26 DIAGNOSIS — M9908 Segmental and somatic dysfunction of rib cage: Secondary | ICD-10-CM

## 2022-05-26 DIAGNOSIS — M533 Sacrococcygeal disorders, not elsewhere classified: Secondary | ICD-10-CM | POA: Diagnosis not present

## 2022-05-26 DIAGNOSIS — M9904 Segmental and somatic dysfunction of sacral region: Secondary | ICD-10-CM

## 2022-05-26 NOTE — Patient Instructions (Signed)
[place]  See me in 6-8 weeks

## 2022-05-26 NOTE — Assessment & Plan Note (Signed)
Patient also has some lumbar facet arthropathy but has responded well to injections previously.  Patient has done well with the Effexor at the moment and does not want to change any type of amount.  Chronic problem with mild exacerbation.  Mild worsening recently. Follow-up again in 6 to 8 weeks.

## 2022-06-20 ENCOUNTER — Other Ambulatory Visit: Payer: Self-pay | Admitting: Family Medicine

## 2022-06-28 ENCOUNTER — Other Ambulatory Visit: Payer: Self-pay | Admitting: Family Medicine

## 2022-07-13 NOTE — Progress Notes (Deleted)
  [place] Phone: [phone] Subjective:    I'm seeing this patient by the request  of:  Jinny Sanders, MD  CC:   [id number]  Crystal [name] is a 41 y.o. female coming in with complaint of back and neck pain. OMT 05/26/2022. Patient states   Medications patient has been prescribed: Vit D, Effexor  Taking:         Reviewed prior external information including notes and imaging from previsou exam, outside providers and external EMR if available.   As well as notes that were available from care everywhere and other healthcare systems.  Past medical history, social, surgical and family history all reviewed in electronic medical record.  No pertanent information unless stated regarding to the chief complaint.   Past Medical History:  Diagnosis Date   Allergy    Asthma     Allergies  Allergen Reactions   Dextromethorphan Hbr Rash     Review of Systems:  No headache, visual changes, nausea, vomiting, diarrhea, constipation, dizziness, abdominal pain, skin rash, fevers, chills, night sweats, weight loss, swollen lymph nodes, body aches, joint swelling, chest pain, shortness of breath, mood changes. POSITIVE muscle aches  Objective  unknown if currently breastfeeding.   General: No apparent distress alert and oriented x3 mood and affect normal, dressed appropriately.  HEENT: Pupils equal, extraocular movements intact  Respiratory: Patient's speak in full sentences and does not appear short of breath  Cardiovascular: No lower extremity edema, non tender, no erythema  Gait MSK:  Back   Osteopathic findings  C2 flexed rotated and side bent right C6 flexed rotated and side bent left T3 extended rotated and side bent right inhaled rib T9 extended rotated and side bent left L2 flexed rotated and side bent right Sacrum right on right       Assessment and Plan:  No problem-specific  Assessment & Plan notes found for this encounter.    Nonallopathic problems  Decision today to treat with OMT was based on Physical Exam  After verbal consent patient was treated with HVLA, ME, FPR techniques in cervical, rib, thoracic, lumbar, and sacral  areas  Patient tolerated the procedure well with improvement in symptoms  Patient given exercises, stretches and lifestyle modifications  See medications in patient instructions if given  Patient will follow up in 4-8 weeks             Note: This dictation was prepared with Dragon dictation along with smaller phrase technology. Any transcriptional errors that result from this process are unintentional.

## 2022-07-14 ENCOUNTER — Ambulatory Visit: Payer: Managed Care, Other (non HMO) | Admitting: Family Medicine

## 2022-08-05 NOTE — Progress Notes (Deleted)
  [place] Phone: [phone] Subjective:    I'm seeing this patient by the request  of:  Jinny Sanders, MD  CC:   [id number]  [name] is a 41 y.o. female coming in with complaint of back and neck pain. OMT 05/26/2022. Patient states   Medications patient has been prescribed: Effexor, Vit D  Taking:         Reviewed prior external information including notes and imaging from previsou exam, outside providers and external EMR if available.   As well as notes that were available from care everywhere and other healthcare systems.  Past medical history, social, surgical and family history all reviewed in electronic medical record.  No pertanent information unless stated regarding to the chief complaint.   Past Medical History:  Diagnosis Date   Allergy    Asthma     Allergies  Allergen Reactions   Dextromethorphan Hbr Rash     Review of Systems:  No headache, visual changes, nausea, vomiting, diarrhea, constipation, dizziness, abdominal pain, skin rash, fevers, chills, night sweats, weight loss, swollen lymph nodes, body aches, joint swelling, chest pain, shortness of breath, mood changes. POSITIVE muscle aches  Objective  unknown if currently breastfeeding.   General: No apparent distress alert and oriented x3 mood and affect normal, dressed appropriately.  HEENT: Pupils equal, extraocular movements intact  Respiratory: Patient's speak in full sentences and does not appear short of breath  Cardiovascular: No lower extremity edema, non tender, no erythema  Gait MSK:  Back   Osteopathic findings  C2 flexed rotated and side bent right C6 flexed rotated and side bent left T3 extended rotated and side bent right inhaled rib T9 extended rotated and side bent left L2 flexed rotated and side bent right Sacrum right on right       Assessment and Plan:  No problem-specific  Assessment & Plan notes found for this encounter.    Nonallopathic problems  Decision today to treat with OMT was based on Physical Exam  After verbal consent patient was treated with HVLA, ME, FPR techniques in cervical, rib, thoracic, lumbar, and sacral  areas  Patient tolerated the procedure well with improvement in symptoms  Patient given exercises, stretches and lifestyle modifications  See medications in patient instructions if given  Patient will follow up in 4-8 weeks             Note: This dictation was prepared with Dragon dictation along with smaller phrase technology. Any transcriptional errors that result from this process are unintentional.

## 2022-08-08 ENCOUNTER — Ambulatory Visit: Payer: Managed Care, Other (non HMO) | Admitting: Family Medicine

## 2022-09-16 NOTE — Progress Notes (Unsigned)
[place] Phone: [phone] Subjective:   [name], am serving as a scribe for Dr. Hulan Saas.  I'm seeing this patient by the request  of:  Bedsole, Amy E, MD  CC: back and neck pain f/u   [id number]  Crystal [name] is a 41 y.o. female coming in with complaint of back and neck pain. OMT 05/26/2022. Patient states that her back has bene flared up due to standing on concrete. Ice and rest will alleviate her pain typically.   Medications patient has been prescribed: Vit D  Taking:         Reviewed prior external information including notes and imaging from previsou exam, outside providers and external EMR if available.   As well as notes that were available from care everywhere and other healthcare systems.  Past medical history, social, surgical and family history all reviewed in electronic medical record.  No pertanent information unless stated regarding to the chief complaint.   Past Medical History:  Diagnosis Date   Allergy    Asthma     Allergies  Allergen Reactions   Dextromethorphan Hbr Rash     Review of Systems:  No headache, visual changes, nausea, vomiting, diarrhea, constipation, dizziness, abdominal pain, skin rash, fevers, chills, night sweats, weight loss, swollen lymph nodes, body aches, joint swelling, chest pain, shortness of breath, mood changes. POSITIVE muscle aches  Objective  Blood pressure 112/86, height '5\' 4"'$  (1.626 m), weight 168 lb (76.2 kg), unknown if currently breastfeeding.   General: No apparent distress alert and oriented x3 mood and affect normal, dressed appropriately.  He does seem to be more frustrated talking about a incident that happened at her son's school HEENT: Pupils equal, extraocular movements intact  Respiratory: Patient's speak in full sentences and does not appear short of breath  Cardiovascular: No lower extremity edema, non tender, no  erythema  Gait MSK:  Back does have tightness noted in the right SI joint  Tightness of neck on the right  Negative Spurling's noted.  5 out of 5 strength of the lower extremities.   Osteopathic findings  C2 flexed rotated and side bent right C6 flexed rotated and side bent left T3 extended rotated and side bent right inhaled rib T9 extended rotated and side bent left L2 flexed rotated and side bent right Sacrum right on right       Assessment and Plan:  SI (sacroiliac) joint dysfunction Patient has responded well to osteopathic manipulation.  Discussed with patient about icing regimen and home exercises.  Continuing with core strengthening.  Patient has had a lot of stress recently with patient's child in school administration over the towing of a car which is stopped her from doing certain activities.  Follow-up again in 6 to 8 weeks otherwise.    Nonallopathic problems  Decision today to treat with OMT was based on Physical Exam  After verbal consent patient was treated with HVLA, ME, FPR techniques in cervical, rib, thoracic, lumbar, and sacral  areas  Patient tolerated the procedure well with improvement in symptoms  Patient given exercises, stretches and lifestyle modifications  See medications in patient instructions if given  Patient will follow up in 4-8 weeks    The above documentation has been reviewed and is accurate and complete Crystal Pulley, DO          Note: This dictation was prepared with Dragon dictation along with smaller phrase technology.  Any transcriptional errors that result from this process are unintentional.

## 2022-09-19 ENCOUNTER — Ambulatory Visit: Payer: Managed Care, Other (non HMO) | Admitting: Family Medicine

## 2022-09-19 VITALS — BP 112/86 | Ht 64.0 in | Wt 168.0 lb

## 2022-09-19 DIAGNOSIS — M9904 Segmental and somatic dysfunction of sacral region: Secondary | ICD-10-CM | POA: Diagnosis not present

## 2022-09-19 DIAGNOSIS — M9902 Segmental and somatic dysfunction of thoracic region: Secondary | ICD-10-CM

## 2022-09-19 DIAGNOSIS — M9901 Segmental and somatic dysfunction of cervical region: Secondary | ICD-10-CM | POA: Diagnosis not present

## 2022-09-19 DIAGNOSIS — M9908 Segmental and somatic dysfunction of rib cage: Secondary | ICD-10-CM | POA: Diagnosis not present

## 2022-09-19 DIAGNOSIS — M9903 Segmental and somatic dysfunction of lumbar region: Secondary | ICD-10-CM | POA: Diagnosis not present

## 2022-09-19 DIAGNOSIS — M533 Sacrococcygeal disorders, not elsewhere classified: Secondary | ICD-10-CM | POA: Diagnosis not present

## 2022-09-19 NOTE — Patient Instructions (Signed)
See me in 6 weeks

## 2022-09-19 NOTE — Assessment & Plan Note (Signed)
Patient has responded well to osteopathic manipulation.  Discussed with patient about icing regimen and home exercises.  Continuing with core strengthening.  Patient has had a lot of stress recently with patient's child in school administration over the towing of a car which is stopped her from doing certain activities.  Follow-up again in 6 to 8 weeks otherwise.

## 2022-10-26 NOTE — Progress Notes (Signed)
[place] Phone: [phone] Subjective:   Crystal [name], am serving as a scribe for Dr. Hulan Saas.  I'm seeing this patient by the request  of:  Bedsole, Amy E, MD  CC: Low back and neck pain  [id number]  Crystal [name] is a 41 y.o. female coming in with complaint of back and neck pain. OMT 09/19/2022. Patient states that she has had some flare ups since last visit in R side of lumbar spine.   One week ago patient woke up and felt like she hit her elbow. Redness, swelling and heat were present but are subsiding. Unsure if she was bitten by something. Skin is now pealing over olecranon. Patient is now having pain throughout entire R arm up to the shoulder.   Medications patient has been prescribed: Effexor, Vit D  Taking:         Reviewed prior external information including notes and imaging from previsou exam, outside providers and external EMR if available.   As well as notes that were available from care everywhere and other healthcare systems.  Past medical history, social, surgical and family history all reviewed in electronic medical record.  [name] pertanent information unless stated regarding to the chief complaint.   Past Medical History:  Diagnosis Date   Allergy    Asthma     Allergies  Allergen Reactions   Dextromethorphan Hbr Rash     Review of Systems:  [name] headache, visual changes, nausea, vomiting, diarrhea, constipation, dizziness, abdominal pain, skin rash, fevers, chills, night sweats, weight loss, swollen lymph nodes, body aches, joint swelling, chest pain, shortness of breath, mood changes. POSITIVE muscle aches  Objective  Blood pressure 112/74, pulse 89, height '5\' 4"'$  (1.626 m), weight 167 lb (75.8 kg), SpO2 99 %, unknown if currently breastfeeding.   General: [name] apparent distress alert and oriented x3 mood and affect normal, dressed appropriately.  HEENT: Pupils equal,  extraocular movements intact  Respiratory: Patient's speak in full sentences and does not appear short of breath  Cardiovascular: [name] lower extremity edema, non tender, [name] erythema  Gait MSK:  Back loss of lordosis.  Patient does have some weakness with hip abductor strength noted right greater than left. Right elbow exam does have cellulitis noted minorly over the olecranon area.  Does appear to have a potential bite mark noted.  Mild warmness to palpation.   Osteopathic findings  C2 flexed rotated and side bent right C6 flexed rotated and side bent left T3 extended rotated and side bent right inhaled rib T9 extended rotated and side bent left L2 flexed rotated and side bent right L3 flexed rotated and side bent left Sacrum right on right       Assessment and Plan:  Cellulitis of right elbow New problem, spider bite, given doxycycline secondary to patient area and concern.  Worsening symptoms to seek medical attention but likely will do well.  SI (sacroiliac) joint dysfunction Chronic problem with some mild increase in tightness recently.  Discussed with patient to continue to work on the weight loss which she is doing well.  Discussed posture and ergonomics.  Still has hip abductor strengthening necessary.  Follow-up again in 6 to 8 weeks    Nonallopathic problems  Decision today to treat with OMT was based on Physical Exam  After verbal consent patient was treated with HVLA, ME, FPR techniques in cervical, rib, thoracic, lumbar, and sacral  areas  Patient tolerated  the procedure well with improvement in symptoms  Patient given exercises, stretches and lifestyle modifications  See medications in patient instructions if given  Patient will follow up in 4-8 weeks             Note: This dictation was prepared with Dragon dictation along with smaller phrase technology. Any transcriptional errors that result from this process are unintentional.

## 2022-11-01 ENCOUNTER — Ambulatory Visit: Payer: Managed Care, Other (non HMO) | Admitting: Family Medicine

## 2022-11-01 VITALS — BP 112/74 | HR 89 | Ht 64.0 in | Wt 167.0 lb

## 2022-11-01 DIAGNOSIS — M9908 Segmental and somatic dysfunction of rib cage: Secondary | ICD-10-CM | POA: Diagnosis not present

## 2022-11-01 DIAGNOSIS — M9904 Segmental and somatic dysfunction of sacral region: Secondary | ICD-10-CM

## 2022-11-01 DIAGNOSIS — M9903 Segmental and somatic dysfunction of lumbar region: Secondary | ICD-10-CM | POA: Diagnosis not present

## 2022-11-01 DIAGNOSIS — M9901 Segmental and somatic dysfunction of cervical region: Secondary | ICD-10-CM

## 2022-11-01 DIAGNOSIS — L03113 Cellulitis of right upper limb: Secondary | ICD-10-CM

## 2022-11-01 DIAGNOSIS — M533 Sacrococcygeal disorders, not elsewhere classified: Secondary | ICD-10-CM

## 2022-11-01 DIAGNOSIS — M9902 Segmental and somatic dysfunction of thoracic region: Secondary | ICD-10-CM | POA: Diagnosis not present

## 2022-11-01 MED ORDER — DOXYCYCLINE HYCLATE 100 MG PO TABS: 100.0000 mg | ORAL_TABLET | Freq: Two times a day (BID) | ORAL | 0 refills | Status: DC

## 2022-11-01 NOTE — Assessment & Plan Note (Signed)
New problem, spider bite, given doxycycline secondary to patient area and concern.  Worsening symptoms to seek medical attention but likely will do well.

## 2022-11-01 NOTE — Patient Instructions (Signed)
See me in 6-7 weeks

## 2022-11-01 NOTE — Assessment & Plan Note (Signed)
Chronic problem with some mild increase in tightness recently.  Discussed with patient to continue to work on the weight loss which she is doing well.  Discussed posture and ergonomics.  Still has hip abductor strengthening necessary.  Follow-up again in 6 to 8 weeks

## 2022-11-15 ENCOUNTER — Other Ambulatory Visit: Payer: Self-pay | Admitting: Family Medicine

## 2022-12-12 NOTE — Progress Notes (Deleted)
  [place] Phone: [phone] Subjective:    I'm seeing this patient by the request  of:  Jinny Sanders, MD  CC:   [id number]  Crystal [name] is a 41 y.o. female coming in with complaint of back and neck pain. OMT 11/01/2022. Patient states   Medications patient has been prescribed: Vit D  Taking:         Reviewed prior external information including notes and imaging from previsou exam, outside providers and external EMR if available.   As well as notes that were available from care everywhere and other healthcare systems.  Past medical history, social, surgical and family history all reviewed in electronic medical record.  No pertanent information unless stated regarding to the chief complaint.   Past Medical History:  Diagnosis Date   Allergy    Asthma     Allergies  Allergen Reactions   Dextromethorphan Hbr Rash     Review of Systems:  No headache, visual changes, nausea, vomiting, diarrhea, constipation, dizziness, abdominal pain, skin rash, fevers, chills, night sweats, weight loss, swollen lymph nodes, body aches, joint swelling, chest pain, shortness of breath, mood changes. POSITIVE muscle aches  Objective  unknown if currently breastfeeding.   General: No apparent distress alert and oriented x3 mood and affect normal, dressed appropriately.  HEENT: Pupils equal, extraocular movements intact  Respiratory: Patient's speak in full sentences and does not appear short of breath  Cardiovascular: No lower extremity edema, non tender, no erythema  Gait MSK:  Back   Osteopathic findings  C2 flexed rotated and side bent right C6 flexed rotated and side bent left T3 extended rotated and side bent right inhaled rib T9 extended rotated and side bent left L2 flexed rotated and side bent right Sacrum right on right       Assessment and Plan:  No problem-specific Assessment & Plan  notes found for this encounter.    Nonallopathic problems  Decision today to treat with OMT was based on Physical Exam  After verbal consent patient was treated with HVLA, ME, FPR techniques in cervical, rib, thoracic, lumbar, and sacral  areas  Patient tolerated the procedure well with improvement in symptoms  Patient given exercises, stretches and lifestyle modifications  See medications in patient instructions if given  Patient will follow up in 4-8 weeks             Note: This dictation was prepared with Dragon dictation along with smaller phrase technology. Any transcriptional errors that result from this process are unintentional.

## 2022-12-15 ENCOUNTER — Ambulatory Visit: Payer: Managed Care, Other (non HMO) | Admitting: Family Medicine

## 2022-12-27 NOTE — Progress Notes (Deleted)
  [place] Phone: [phone] Subjective:    I'm seeing this patient by the request  of:  Jinny Sanders, MD  CC:   [id number]  PAIZLIE KLAUS is a 42 y.o. female coming in with complaint of back and neck pain. OMT 11/01/2022. Patient states   Medications patient has been prescribed: Doxy, Vit D  Taking:         Reviewed prior external information including notes and imaging from previsou exam, outside providers and external EMR if available.   As well as notes that were available from care everywhere and other healthcare systems.  Past medical history, social, surgical and family history all reviewed in electronic medical record.  No pertanent information unless stated regarding to the chief complaint.   Past Medical History:  Diagnosis Date   Allergy    Asthma     Allergies  Allergen Reactions   Dextromethorphan Hbr Rash     Review of Systems:  No headache, visual changes, nausea, vomiting, diarrhea, constipation, dizziness, abdominal pain, skin rash, fevers, chills, night sweats, weight loss, swollen lymph nodes, body aches, joint swelling, chest pain, shortness of breath, mood changes. POSITIVE muscle aches  Objective  unknown if currently breastfeeding.   General: No apparent distress alert and oriented x3 mood and affect normal, dressed appropriately.  HEENT: Pupils equal, extraocular movements intact  Respiratory: Patient's speak in full sentences and does not appear short of breath  Cardiovascular: No lower extremity edema, non tender, no erythema  Gait MSK:  Back   Osteopathic findings  C2 flexed rotated and side bent right C6 flexed rotated and side bent left T3 extended rotated and side bent right inhaled rib T9 extended rotated and side bent left L2 flexed rotated and side bent right Sacrum right on right       Assessment and Plan:  No problem-specific Assessment  & Plan notes found for this encounter.    Nonallopathic problems  Decision today to treat with OMT was based on Physical Exam  After verbal consent patient was treated with HVLA, ME, FPR techniques in cervical, rib, thoracic, lumbar, and sacral  areas  Patient tolerated the procedure well with improvement in symptoms  Patient given exercises, stretches and lifestyle modifications  See medications in patient instructions if given  Patient will follow up in 4-8 weeks             Note: This dictation was prepared with Dragon dictation along with smaller phrase technology. Any transcriptional errors that result from this process are unintentional.

## 2022-12-29 ENCOUNTER — Encounter: Payer: Self-pay | Admitting: Family Medicine

## 2022-12-30 ENCOUNTER — Ambulatory Visit: Payer: Managed Care, Other (non HMO) | Admitting: Family Medicine

## 2023-01-03 ENCOUNTER — Ambulatory Visit
Admission: EM | Admit: 2023-01-03 | Discharge: 2023-01-03 | Disposition: A | Discharge status: Home / Self Care | Payer: Managed Care, Other (non HMO) | Attending: Internal Medicine | Admitting: Internal Medicine

## 2023-01-03 ENCOUNTER — Encounter: Payer: Self-pay | Admitting: Emergency Medicine

## 2023-01-03 ENCOUNTER — Other Ambulatory Visit: Payer: Self-pay

## 2023-01-03 DIAGNOSIS — J111 Influenza due to unidentified influenza virus with other respiratory manifestations: Secondary | ICD-10-CM | POA: Diagnosis not present

## 2023-01-03 DIAGNOSIS — J4521 Mild intermittent asthma with (acute) exacerbation: Secondary | ICD-10-CM

## 2023-01-03 MED ORDER — ALBUTEROL SULFATE HFA 108 (90 BASE) MCG/ACT IN AERS: 1.0000 | INHALATION_SPRAY | Freq: Four times a day (QID) | RESPIRATORY_TRACT | 0 refills | Status: AC | PRN

## 2023-01-03 MED ORDER — PREDNISONE 20 MG PO TABS
40.0000 mg | ORAL_TABLET | Freq: Every day | ORAL | 0 refills | Status: AC
Start: 2023-01-03 — End: 2023-01-08

## 2023-01-03 NOTE — Discharge Instructions (Signed)
You most likely have flu given your close exposure.  I am concerned for an asthma flareup so I have prescribed prednisone.  I have also refilled your inhaler.  Please follow-up if any symptoms persist or worsen.

## 2023-01-03 NOTE — ED Triage Notes (Signed)
Pt here for fever and cough x 6 days with sinus congestion

## 2023-01-03 NOTE — ED Provider Notes (Signed)
[facility]    CSN: [id number] Arrival date & time: 01/03/23  1306      History   Chief Complaint Chief Complaint  Patient presents with   Cough    HPI Crystal [name] is a 42 y.o. female.   Patient presents with 6-day history of fever, cough, nasal congestion, sinus pressure.  Patient reports that her husband and son recently tested positive for influenza. Tmax at home was 102.  She has taken Tylenol and over-the-counter cold and flu medication with minimal improvement of symptoms.  Patient reports history of asthma and has been using albuterol inhaler with minimal improvement.  Denies chest pain, sore throat, ear pain, nausea, vomiting, diarrhea, abdominal pain.   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   Gestational diabetes mellitus (GDM) controlled on oral hypoglycemic drug 10/03/2017   Abnormal fetal heart rate 09/12/2017   Trigger point of right shoulder region 11/25/2016   Concussion with loss of consciousness 11/16/2016   Whiplash 11/16/2016   Hamstring tendinitis at origin 06/02/2016   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   Lymphadenopathy 11/10/2015   Posterior interosseous nerve pain 09/11/2015   Generalized anxiety disorder 11/28/2014   Back spasm 10/10/2014   Nonallopathic lesion of lumbosacral region 10/10/2014   Nonallopathic lesion of sacral region 10/10/2014   Nonallopathic lesion of thoracic region 10/10/2014   Strain of left quadriceps muscle 09/05/2014   Chest pain 09/05/2014   Stress incontinence in female  05/23/2014   CARPAL TUNNEL SYNDROME, LEFT 09/14/2010   GANGLION CYST, WRIST, LEFT 09/14/2010   MIGRAINE, COMMON 05/27/2008   ALLERGIC RHINITIS 05/27/2008   DIABETES MELLITUS, GESTATIONAL, HX OF 05/27/2008   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 Authorizing Provider  predniSONE (DELTASONE) 20 MG tablet Take 2 tablets (40 mg total) by mouth daily for 5 days. 01/03/23 01/08/23 Yes Alvilda Mckenna, Michele Rockers, FNP  albuterol (VENTOLIN HFA) 108 (90 Base) MCG/ACT inhaler Inhale 1-2 puffs into the lungs every 6 (six) hours as needed for wheezing or shortness of breath. 01/03/23   Teodora Medici, FNP  amphetamine-dextroamphetamine (ADDERALL) 15 MG tablet Take 15 mg by mouth daily.    [provider]  doxycycline (VIBRA-TABS) 100  MG tablet Take 1 tablet (100 mg total) by mouth 2 (two) times daily. 11/01/22   Lyndal Pulley, DO  gabapentin (NEURONTIN) 100 MG capsule TAKE 2 CAPSULES (200 MG TOTAL) BY MOUTH AT BEDTIME. 01/27/20   Lyndal Pulley, DO  ipratropium (ATROVENT) 0.06 % nasal spray Place 2 sprays into both nostrils 4 (four) times daily. 11/01/20   Ok Edwards, PA-C  Prenatal Vit-Fe Fumarate-FA (PRENATAL MULTIVITAMIN) TABS tablet Take 1 tablet by mouth daily at 12 noon.    [provider]  venlafaxine XR (EFFEXOR-XR) 75 MG 24 hr capsule TAKE 1 CAPSULE BY MOUTH DAILY WITH BREAKFAST. 06/30/22   Lyndal Pulley, DO  Vitamin D, Ergocalciferol, (DRISDOL) 1.25 MG  (50000 UNIT) CAPS capsule TAKE 1 CAPSULE (50,000 UNITS TOTAL) BY MOUTH EVERY 7 (SEVEN) DAYS 11/15/22   Lyndal Pulley, DO    Family History Family History  Problem Relation Age of Onset   Fibromyalgia Father    Autoimmune disease Father    Cancer Maternal Grandmother        stomach   Diabetes Maternal Grandmother    Diabetes Paternal Grandmother    Heart disease Paternal Grandmother    Alcohol abuse Paternal Grandfather    Cancer Paternal Grandfather        lung   Diabetes Mother     Social History Social History   Tobacco Use   Smoking status: Never   Smokeless tobacco: Never  Substance Use Topics   Alcohol use: Yes   Drug use: No     Allergies   Dextromethorphan hbr   Review of Systems Review of Systems Per HPI  Physical Exam Triage Vital Signs ED Triage Vitals  Enc Vitals Group     BP 01/03/23 1447 (!) 148/85     Pulse Rate 01/03/23 1447 (!) 102     Resp 01/03/23 1447 18     Temp 01/03/23 1447 98.3 F (36.8 C)     Temp Source 01/03/23 1447 Oral     SpO2 01/03/23 1447 95 %     Weight --      Height --      Head Circumference --      Peak Flow --      Pain Score 01/03/23 1453 5     Pain Loc --      Pain Edu? --      Excl. in [place]? --    No data found.  Updated Vital Signs BP (!) 148/85 (BP Location: Left Arm)   Pulse (!) 102   Temp 98.3 F (36.8 C) (Oral)   Resp 18   SpO2 95%   Visual Acuity Right Eye Distance:   Left Eye Distance:   Bilateral Distance:    Right Eye Near:   Left Eye Near:    Bilateral Near:     Physical Exam Constitutional:      General: She is not in acute distress.    Appearance: Normal appearance. She is not toxic-appearing or diaphoretic.  HENT:     Head: Normocephalic and atraumatic.     Right Ear: Tympanic membrane and ear canal normal.     Left Ear: Tympanic membrane and ear canal normal.     Nose: Congestion present.     Mouth/Throat:     Mouth: Mucous membranes are moist.     Pharynx: No posterior  oropharyngeal erythema.  Eyes:     Extraocular Movements: Extraocular movements intact.     Conjunctiva/sclera: Conjunctivae normal.  Pupils: Pupils are equal, round, and reactive to light.  Cardiovascular:     Rate and Rhythm: Normal rate and regular rhythm.     Pulses: Normal pulses.     Heart sounds: Normal heart sounds.  Pulmonary:     Effort: Pulmonary effort is normal. No respiratory distress.     Breath sounds: Normal breath sounds. No stridor. No wheezing, rhonchi or rales.  Abdominal:     General: Abdomen is flat. Bowel sounds are normal.     Palpations: Abdomen is soft.  Musculoskeletal:        General: Normal range of motion.     Cervical back: Normal range of motion.  Skin:    General: Skin is warm and dry.  Neurological:     General: No focal deficit present.     Mental Status: She is alert and oriented to person, place, and time. Mental status is at baseline.  Psychiatric:        Mood and Affect: Mood normal.        Behavior: Behavior normal.      UC Treatments / Results  Labs (all labs ordered are listed, but only abnormal results are displayed) Labs Reviewed - No data to display  EKG   Radiology No results found.  Procedures Procedures (including critical care time)  Medications Ordered in UC Medications - No data to display  Initial Impression / Assessment and Plan / UC Course  I have reviewed the triage vital signs and the nursing notes.  Pertinent labs & imaging results that were available during my care of the patient were reviewed by me and considered in my medical decision making (see chart for details).     Patient's family members have influenza so this is most likely patient's etiology of symptoms given close exposure.  Do not have flu testing capabilities here in urgent care at this time but given duration of symptoms, do not think this would be beneficial.  Will treat symptomatically. Given albuterol inhaler has not been helpful,   Will treat with prednisone as patient has taken this before and tolerated well.  Patient requested albuterol inhaler refill so this provided as well.  No concern for secondary bacterial infection at this time.  There are no adventitious lung sounds on exam or signs of respiratory compromise so do not think that chest imaging or emergent evaluation is necessary.  Patient's heart rate was mildly elevated on initial triage but on physical exam, it appears normal.  Discussed return precautions.  Patient verbalized understanding and was agreeable with plan. Final Clinical Impressions(s) / UC Diagnoses   Final diagnoses:  Influenza  Mild intermittent asthma with acute exacerbation     Discharge Instructions      You most likely have flu given your close exposure.  I am concerned for an asthma flareup so I have prescribed prednisone.  I have also refilled your inhaler.  Please follow-up if any symptoms persist or worsen.    ED Prescriptions     Medication Sig Dispense Auth. Provider   albuterol (VENTOLIN HFA) 108 (90 Base) MCG/ACT inhaler Inhale 1-2 puffs into the lungs every 6 (six) hours as needed for wheezing or shortness of breath. 18 g Oswaldo Conroy E, [place]   predniSONE (DELTASONE) 20 MG tablet Take 2 tablets (40 mg total) by mouth daily for 5 days. 10 tablet Teodora Medici, [place]      PDMP not reviewed this encounter.   Teodora Medici, [place] 01/03/23 1534

## 2023-02-04 ENCOUNTER — Other Ambulatory Visit: Payer: Self-pay | Admitting: Family Medicine

## 2023-03-23 NOTE — Progress Notes (Deleted)
  [facility] [place] Phone: [phone] Subjective:    I'm seeing this patient by the request  of:  Bedsole, Amy E, MD  CC: back and neck pain   [id number]  Crystal [name] is a 42 y.o. female coming in with complaint of back and neck pain. OMT on 09/19/2022. Patient states   Medications patient has been prescribed:   Taking:         Reviewed prior external information including notes and imaging from previsou exam, outside providers and external EMR if available.   As well as notes that were available from care everywhere and other healthcare systems.  Past medical history, social, surgical and family history all reviewed in electronic medical record.  No pertanent information unless stated regarding to the chief complaint.   Past Medical History:  Diagnosis Date   Allergy    Asthma     Allergies  Allergen Reactions   Dextromethorphan Hbr Rash     Review of Systems:  No headache, visual changes, nausea, vomiting, diarrhea, constipation, dizziness, abdominal pain, skin rash, fevers, chills, night sweats, weight loss, swollen lymph nodes, body aches, joint swelling, chest pain, shortness of breath, mood changes. POSITIVE muscle aches  Objective  unknown if currently breastfeeding.   General: No apparent distress alert and oriented x3 mood and affect normal, dressed appropriately.  HEENT: Pupils equal, extraocular movements intact  Respiratory: Patient's speak in full sentences and does not appear short of breath  Cardiovascular: No lower extremity edema, non tender, no erythema  MSK:  Back   Osteopathic findings  C2 flexed rotated and side bent right C6 flexed rotated and side bent left T3 extended rotated and side bent right inhaled rib T9 extended rotated and side bent left L2 flexed rotated and side bent right Sacrum right on right       Assessment and Plan:  No problem-specific  Assessment & Plan notes found for this encounter.    Nonallopathic problems  Decision today to treat with OMT was based on Physical Exam  After verbal consent patient was treated with HVLA, ME, FPR techniques in cervical, rib, thoracic, lumbar, and sacral  areas  Patient tolerated the procedure well with improvement in symptoms  Patient given exercises, stretches and lifestyle modifications  See medications in patient instructions if given  Patient will follow up in 4-8 weeks     The above documentation has been reviewed and is accurate and complete Lyndal Pulley, DO         Note: This dictation was prepared with Dragon dictation along with smaller phrase technology. Any transcriptional errors that result from this process are unintentional.

## 2023-03-30 ENCOUNTER — Ambulatory Visit: Payer: Managed Care, Other (non HMO) | Admitting: Family Medicine

## 2023-04-05 ENCOUNTER — Other Ambulatory Visit: Payer: Self-pay | Admitting: Family Medicine

## 2023-06-12 ENCOUNTER — Other Ambulatory Visit: Payer: Self-pay | Admitting: Family Medicine

## 2023-06-13 ENCOUNTER — Ambulatory Visit (INDEPENDENT_AMBULATORY_CARE_PROVIDER_SITE_OTHER): Payer: Managed Care, Other (non HMO)

## 2023-06-13 ENCOUNTER — Ambulatory Visit (INDEPENDENT_AMBULATORY_CARE_PROVIDER_SITE_OTHER): Payer: Managed Care, Other (non HMO) | Admitting: Family Medicine

## 2023-06-13 ENCOUNTER — Other Ambulatory Visit: Payer: Self-pay | Admitting: Obstetrics and Gynecology

## 2023-06-13 VITALS — BP 132/86 | HR 99 | Ht 64.0 in | Wt 169.0 lb

## 2023-06-13 DIAGNOSIS — M79641 Pain in right hand: Secondary | ICD-10-CM | POA: Diagnosis not present

## 2023-06-13 DIAGNOSIS — S62652A Nondisplaced fracture of medial phalanx of right middle finger, initial encounter for closed fracture: Secondary | ICD-10-CM

## 2023-06-13 DIAGNOSIS — M25512 Pain in left shoulder: Secondary | ICD-10-CM

## 2023-06-13 DIAGNOSIS — R928 Other abnormal and inconclusive findings on diagnostic imaging of breast: Secondary | ICD-10-CM

## 2023-06-13 NOTE — Progress Notes (Unsigned)
   Rubin Payor, PhD, LAT, ATC acting as a scribe for Crystal Graham, MD.  Crystal [name] is a 42 y.o. female who presents to [facility] today for R hand pain. Pt was previously seen by Dr. Katrinka Blazing on 11/01/22 for SI joint dysfunction.   Today, pt c/o R hand pain ongoing since this weekend. She was doing some tubing, she was holding onto the other tube, then they flipped. She locates her pain to her 3rd-4th fingers on he R hand, mostly in the IP joints. She notes a hx of finger fx 15 years ago. She also c/o a "burning" pain through her L arm w/ neck pain and pain w/ R shoulder aBd.   Finger swelling: yes Treatments tried: buddy tape, IBU, tizanidine  Pertinent review of systems: ***  Relevant historical information: ***   Exam:  There were no vitals taken for this visit. General: Well Developed, well nourished, and in no acute distress.   MSK: ***    Lab and Radiology Results No results found for this or any previous visit (from the past 72 hour(s)). No results found.     Assessment and Plan: 42 y.o. female with ***   PDMP not reviewed this encounter. No orders of the defined types were placed in this encounter.  No orders of the defined types were placed in this encounter.    Discussed warning signs or symptoms. Please see discharge instructions. Patient expresses understanding.   ***

## 2023-06-13 NOTE — Patient Instructions (Signed)
Thank you for coming in today.   I've referred you to Occupational Therapy.  Let us know if you don't hear from them in one week.   Ok to buddy tape those fingers.   Keep me updated.

## 2023-06-19 NOTE — Progress Notes (Signed)
Right hand x-ray shows tiny fracture at the middle finger.  Continue buddy splinting.

## 2023-06-21 NOTE — Progress Notes (Signed)
[name] [address] [place] [address] Phone: [phone] Subjective:   Crystal [name], am serving as a scribe for Dr. Antoine Primas.  I'm seeing this patient by the request  of:  Bedsole, Amy E, MD  CC: Finger pain follow-up, back and neck pain  [id number]  Crystal [name] is a 42 y.o. female coming in with complaint of back and neck pain. OMT November 2023. Saw Dr. Denyse Amass for finger fx June 18th.  Patient states that she fractured her hand holding onto a tube that was being pulled behind a boat. Also having L shoulder pain. Notes burning that radiates from middle deltoid into her forearm. Painful to horizontally abduct arm.   Also having a flare in the lower back in same spot that she had epidural.   Medications patient has been prescribed:   Taking:         Reviewed prior external information including notes and imaging from previsou exam, outside providers and external EMR if available.   As well as notes that were available from care everywhere and other healthcare systems.  Past medical history, social, surgical and family history all reviewed in electronic medical record.  No pertanent information unless stated regarding to the chief complaint.   Past Medical History:  Diagnosis Date   Allergy    Asthma     Allergies  Allergen Reactions   Dextromethorphan Hbr Rash     Review of Systems:  No headache, visual changes, nausea, vomiting, diarrhea, constipation, dizziness, abdominal pain, skin rash, fevers, chills, night sweats, weight loss, swollen lymph nodes, body aches, joint swelling, chest pain, shortness of breath, mood changes. POSITIVE muscle aches  Objective  Blood pressure (!) 142/102, pulse 91, height 5\' 4"  (1.626 m), weight 171 lb (77.6 kg), last menstrual period 06/06/2023, SpO2 99 %, unknown if currently breastfeeding.   General: No apparent distress alert and oriented x3 mood and affect normal, dressed  appropriately.  HEENT: Pupils equal, extraocular movements intact  Respiratory: Patient's speak in full sentences and does not appear short of breath  Cardiovascular: No lower extremity edema, non tender, no erythema  Right middle finger does have swelling noted over the PIP joint.  Some tender to palpation.  Feels like she has some lateral plate subluxation noted. Neck exam does have significant loss of lordosis.  Significant tenderness to palpation over the sacroiliac joints bilaterally does have tightness noted at the hip flexor.  Osteopathic findings  C2 flexed rotated and side bent right C6 flexed rotated and side bent left T3 extended rotated and side bent right inhaled rib T9 extended rotated and side bent left L2 flexed rotated and side bent right Sacrum right on right       Assessment and Plan:  SI (sacroiliac) joint dysfunction Sacroiliac dysfunction noted.  Discussed posture and ergonomics, discussed continuing to work on core strengthening.  Follow-up 6 to 8 weeks  Nondisplaced fracture of middle phalanx of right middle finger, initial encounter for closed fracture Morbid avulsion fracture noted.  Will put in a frog splint.  Do think patient will do relatively well.  Would like her to start doing vitamin D.  Will do bracing for 2 weeks and then start increasing range of motion.  Should do relatively well and will consider the possibility of repeating x-rays at next follow-up    Nonallopathic problems  Decision today to treat with OMT was based on Physical Exam  After verbal consent patient was treated with  HVLA, ME, FPR techniques in cervical, rib, thoracic, lumbar, and sacral  areas  Patient tolerated the procedure well with improvement in symptoms  Patient given exercises, stretches and lifestyle modifications  See medications in patient instructions if given  Patient will follow up in 4-8 weeks             Note: This dictation was prepared with Dragon  dictation along with smaller phrase technology. Any transcriptional errors that result from this process are unintentional.

## 2023-06-22 ENCOUNTER — Other Ambulatory Visit: Payer: Self-pay

## 2023-06-22 ENCOUNTER — Ambulatory Visit: Payer: Managed Care, Other (non HMO) | Admitting: Family Medicine

## 2023-06-22 ENCOUNTER — Encounter: Payer: Self-pay | Admitting: Family Medicine

## 2023-06-22 VITALS — BP 142/102 | HR 91 | Ht 64.0 in | Wt 171.0 lb

## 2023-06-22 DIAGNOSIS — M9903 Segmental and somatic dysfunction of lumbar region: Secondary | ICD-10-CM | POA: Diagnosis not present

## 2023-06-22 DIAGNOSIS — M9902 Segmental and somatic dysfunction of thoracic region: Secondary | ICD-10-CM | POA: Diagnosis not present

## 2023-06-22 DIAGNOSIS — M9904 Segmental and somatic dysfunction of sacral region: Secondary | ICD-10-CM

## 2023-06-22 DIAGNOSIS — M533 Sacrococcygeal disorders, not elsewhere classified: Secondary | ICD-10-CM | POA: Diagnosis not present

## 2023-06-22 DIAGNOSIS — M79641 Pain in right hand: Secondary | ICD-10-CM | POA: Diagnosis not present

## 2023-06-22 DIAGNOSIS — S62652A Nondisplaced fracture of medial phalanx of right middle finger, initial encounter for closed fracture: Secondary | ICD-10-CM

## 2023-06-22 DIAGNOSIS — M9908 Segmental and somatic dysfunction of rib cage: Secondary | ICD-10-CM

## 2023-06-22 DIAGNOSIS — M9901 Segmental and somatic dysfunction of cervical region: Secondary | ICD-10-CM

## 2023-06-22 DIAGNOSIS — S134XXD Sprain of ligaments of cervical spine, subsequent encounter: Secondary | ICD-10-CM

## 2023-06-22 MED ORDER — GABAPENTIN 100 MG PO CAPS: 200.0000 mg | ORAL_CAPSULE | Freq: Every day | ORAL | 0 refills | Status: AC

## 2023-06-22 NOTE — Assessment & Plan Note (Signed)
Morbid avulsion fracture noted.  Will put in a frog splint.  Do think patient will do relatively well.  Would like her to start doing vitamin D.  Will do bracing for 2 weeks and then start increasing range of motion.  Should do relatively well and will consider the possibility of repeating x-rays at next follow-up

## 2023-06-22 NOTE — Assessment & Plan Note (Signed)
Sacroiliac dysfunction noted.  Discussed posture and ergonomics, discussed continuing to work on core strengthening.  Follow-up 6 to 8 weeks

## 2023-06-22 NOTE — Patient Instructions (Addendum)
Brace Nighty 2 weeks, Day and Night for a week Gabapentin 200mg  See you again in 6-8 weeks

## 2023-06-22 NOTE — Assessment & Plan Note (Signed)
Has had difficulty with the neck previously and gabapentin given.

## 2023-07-04 ENCOUNTER — Encounter: Payer: Managed Care, Other (non HMO) | Admitting: Rehabilitative and Restorative Service Providers"

## 2023-08-07 NOTE — Progress Notes (Unsigned)
[name] [address] [place] [address] Phone: [phone] Subjective:   Crystal [name], am serving as a scribe for Dr. Antoine Primas.  I'm seeing this patient by the request  of:  Bedsole, Amy E, MD  CC: back, neck and right hand pain   [id number]  Crystal [name] is a 42 y.o. female coming in with complaint of back and neck pain. OMT 06/22/2023. Also f/u for R hand pain. Patient states that she has flares of R sided lower back pain. Flare last week but is feeling better today.   Hand pain is improving but still has inflammation.   Medications patient has been prescribed: Gabapentin  Taking:         Reviewed prior external information including notes and imaging from previsou exam, outside providers and external EMR if available.   As well as notes that were available from care everywhere and other healthcare systems.  Past medical history, social, surgical and family history all reviewed in electronic medical record.  No pertanent information unless stated regarding to the chief complaint.   Past Medical History:  Diagnosis Date   Allergy    Asthma     Allergies  Allergen Reactions   Dextromethorphan Hbr Rash     Review of Systems:  No headache, visual changes, nausea, vomiting, diarrhea, constipation, dizziness, abdominal pain, skin rash, fevers, chills, night sweats, weight loss, swollen lymph nodes, body aches, joint swelling, chest pain, shortness of breath, mood changes. POSITIVE muscle aches  Objective  Blood pressure 120/88, pulse 87, height 5\' 4"  (1.626 m), weight 168 lb (76.2 kg), SpO2 97%, unknown if currently breastfeeding.   General: No apparent distress alert and oriented x3 mood and affect normal, dressed appropriately.  HEENT: Pupils equal, extraocular movements intact  Respiratory: Patient's speak in full sentences and does not appear short of breath  Cardiovascular: No lower extremity edema, non  tender, no erythema  MSK:  Back does have some tightness noted.  And some tenderness to palpation in the paraspinal musculature.  Patient does have negative radicular symptoms but does have pain that seems to be out of proportion to the amount of palpation.  Tightness noted in the pelvic girdle.  Patient's abdominal exam does show some tenderness right greater than left as well.  Osteopathic findings  C3 flexed rotated and side bent right C6 flexed rotated and side bent left T3 extended rotated and side bent right inhaled rib T9 extended rotated and side bent left L2 flexed rotated and side bent right Sacrum right on right     Assessment and Plan:  SI (sacroiliac) joint dysfunction Likely sacroiliac joint dysfunction but unfortunately continues to have discomfort that could be more of a intrapelvic pathology.  Has had a pelvic sling previously.  Discussed with patient that if this continues consider an MRI of the pelvis with and without contrast.  Patient has noticed association with ovulation.  Encouraged her to continue to work on isometrics and core strengthening.  Discussed medications including gabapentin and meloxicam.  Follow-up again in 6 to 8 weeks otherwise.    Nonallopathic problems  Decision today to treat with OMT was based on Physical Exam  After verbal consent patient was treated with HVLA, ME, FPR techniques in cervical, rib, thoracic, lumbar, and sacral  areas  Patient tolerated the procedure well with improvement in symptoms  Patient given exercises, stretches and lifestyle modifications  See medications in patient instructions if given  Patient will follow  up in 4-8 weeks    The above documentation has been reviewed and is accurate and complete Crystal Saa, DO          Note: This dictation was prepared with Dragon dictation along with smaller phrase technology. Any transcriptional errors that result from this process are unintentional.

## 2023-08-08 ENCOUNTER — Ambulatory Visit: Payer: Managed Care, Other (non HMO) | Admitting: Family Medicine

## 2023-08-08 ENCOUNTER — Encounter: Payer: Self-pay | Admitting: Family Medicine

## 2023-08-08 VITALS — BP 120/88 | HR 87 | Ht 64.0 in | Wt 168.0 lb

## 2023-08-08 DIAGNOSIS — M9901 Segmental and somatic dysfunction of cervical region: Secondary | ICD-10-CM | POA: Diagnosis not present

## 2023-08-08 DIAGNOSIS — M9903 Segmental and somatic dysfunction of lumbar region: Secondary | ICD-10-CM

## 2023-08-08 DIAGNOSIS — M9904 Segmental and somatic dysfunction of sacral region: Secondary | ICD-10-CM | POA: Diagnosis not present

## 2023-08-08 DIAGNOSIS — M533 Sacrococcygeal disorders, not elsewhere classified: Secondary | ICD-10-CM | POA: Diagnosis not present

## 2023-08-08 DIAGNOSIS — M9902 Segmental and somatic dysfunction of thoracic region: Secondary | ICD-10-CM

## 2023-08-08 DIAGNOSIS — M9908 Segmental and somatic dysfunction of rib cage: Secondary | ICD-10-CM

## 2023-08-08 NOTE — Assessment & Plan Note (Signed)
Likely sacroiliac joint dysfunction but unfortunately continues to have discomfort that could be more of a intrapelvic pathology.  Has had a pelvic sling previously.  Discussed with patient that if this continues consider an MRI of the pelvis with and without contrast.  Patient has noticed association with ovulation.  Encouraged her to continue to work on isometrics and core strengthening.  Discussed medications including gabapentin and meloxicam.  Follow-up again in 6 to 8 weeks otherwise.

## 2023-08-08 NOTE — Patient Instructions (Signed)
Good to see you! If we keep helping pelvic pain we can consider MRI See you again in 6-8 weeks

## 2023-08-12 ENCOUNTER — Encounter: Payer: Self-pay | Admitting: Family Medicine

## 2023-09-01 ENCOUNTER — Other Ambulatory Visit: Payer: Self-pay | Admitting: Family Medicine

## 2023-09-18 NOTE — Progress Notes (Unsigned)
  [name] [address] [place] [address] Phone: [phone] Subjective:   INadine Counts, am serving as a scribe for Dr. Antoine Primas.  I'm seeing this patient by the request  of:  Bedsole, Amy E, MD  CC: Back and neck pain follow-up  [id number]  Crystal [name] is a 42 y.o. female coming in with complaint of back and neck pain. OMT 08/08/2023. Patient states same per usual. No new concerns.  Medications patient has been prescribed: Gabapentin, Vit D, Effexor  Taking:         Reviewed prior external information including notes and imaging from previsou exam, outside providers and external EMR if available.   As well as notes that were available from care everywhere and other healthcare systems.  Past medical history, social, surgical and family history all reviewed in electronic medical record.  No pertanent information unless stated regarding to the chief complaint.   Past Medical History:  Diagnosis Date   Allergy    Asthma     Allergies  Allergen Reactions   Dextromethorphan Hbr Rash     Review of Systems:  No headache, visual changes, nausea, vomiting, diarrhea, constipation, dizziness, abdominal pain, skin rash, fevers, chills, night sweats, weight loss, swollen lymph nodes, body aches, joint swelling, chest pain, shortness of breath, mood changes. POSITIVE muscle aches  Objective  Blood pressure (!) 128/92, pulse 90, height 5\' 4"  (1.626 m), weight 166 lb (75.3 kg), SpO2 99%, unknown if currently breastfeeding.   General: No apparent distress alert and oriented x3 mood and affect normal, dressed appropriately.  HEENT: Pupils equal, extraocular movements intact  Respiratory: Patient's speak in full sentences and does not appear short of breath  Cardiovascular: No lower extremity edema, non tender, no erythema  Gait MSK:  Back back does some loss of lordosis noted.  Some tenderness to palpation of the paraspinal  musculature.  Patient does have pain over the right sacroiliac joint.  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  Osteopathic findings C7 flexed rotated and side bent right T3 extended rotated and side bent right inhaled rib T5 extended rotated and side bent right T9 extended rotated and side bent left L2 flexed rotated and side bent right Sacrum right on right       Assessment and Plan:  SI (sacroiliac) joint dysfunction Increasing tightness noted at this time.  Discussed icing regimen and home exercises, discussed avoiding certain activities.  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  Responds well to osteopathic manipulation.  Follow-up again in 6 to 8 weeks for further evaluation and treatment    Nonallopathic problems  Decision today to treat with OMT was based on Physical Exam  After verbal consent patient was treated with HVLA, ME, FPR techniques in cervical, rib, thoracic, lumbar, and sacral  areas  Patient tolerated the procedure well with improvement in symptoms  Patient given exercises, stretches and lifestyle modifications  See medications in patient instructions if given  Patient will follow up in 4-8 weeks     The above documentation has been reviewed and is accurate and complete Judi Saa, DO         Note: This dictation was prepared with Dragon dictation along with smaller phrase technology. Any transcriptional errors that result from this process are unintentional.

## 2023-09-19 ENCOUNTER — Ambulatory Visit: Payer: Managed Care, Other (non HMO) | Admitting: Family Medicine

## 2023-09-19 ENCOUNTER — Encounter: Payer: Self-pay | Admitting: Family Medicine

## 2023-09-19 VITALS — BP 128/92 | HR 90 | Ht 64.0 in | Wt 166.0 lb

## 2023-09-19 DIAGNOSIS — M9908 Segmental and somatic dysfunction of rib cage: Secondary | ICD-10-CM | POA: Diagnosis not present

## 2023-09-19 DIAGNOSIS — M9902 Segmental and somatic dysfunction of thoracic region: Secondary | ICD-10-CM | POA: Diagnosis not present

## 2023-09-19 DIAGNOSIS — M9901 Segmental and somatic dysfunction of cervical region: Secondary | ICD-10-CM | POA: Diagnosis not present

## 2023-09-19 DIAGNOSIS — M533 Sacrococcygeal disorders, not elsewhere classified: Secondary | ICD-10-CM | POA: Diagnosis not present

## 2023-09-19 DIAGNOSIS — M9903 Segmental and somatic dysfunction of lumbar region: Secondary | ICD-10-CM | POA: Diagnosis not present

## 2023-09-19 DIAGNOSIS — M9904 Segmental and somatic dysfunction of sacral region: Secondary | ICD-10-CM

## 2023-09-19 NOTE — Assessment & Plan Note (Signed)
Increasing tightness noted at this time.  Discussed icing regimen and home exercises, discussed avoiding certain activities.  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  Responds well to osteopathic manipulation.  Follow-up again in 6 to 8 weeks for further evaluation and treatment

## 2023-09-19 NOTE — Patient Instructions (Signed)
Good to see you! See you again in 6 weeks

## 2023-11-01 NOTE — Progress Notes (Unsigned)
  [name] [address] [place] [address] Phone: [phone] Subjective:   INadine Counts, am serving as a scribe for Dr. Antoine Primas.  I'm seeing this patient by the request  of:  Bedsole, Amy E, MD  CC: Back and neck pain follow-up  [id number]  Crystal [name] is a 42 y.o. female coming in with complaint of back and neck pain. OMT 09/19/2023. Patient states same per usual. No new concerns.  Medications patient has been prescribed: Effexor, Vit D  Taking:         Reviewed prior external information including notes and imaging from previsou exam, outside providers and external EMR if available.   As well as notes that were available from care everywhere and other healthcare systems.  Past medical history, social, surgical and family history all reviewed in electronic medical record.  No pertanent information unless stated regarding to the chief complaint.   Past Medical History:  Diagnosis Date   Allergy    Asthma     Allergies  Allergen Reactions   Dextromethorphan Hbr Rash     Review of Systems:  No headache, visual changes, nausea, vomiting, diarrhea, constipation, dizziness, abdominal pain, skin rash, fevers, chills, night sweats, weight loss, swollen lymph nodes, body aches, joint swelling, chest pain, shortness of breath, mood changes. POSITIVE muscle aches  Objective  Blood pressure 118/84, pulse (!) 102, height 5\' 4"  (1.626 m), weight 169 lb (76.7 kg), SpO2 97%, unknown if currently breastfeeding.   General: No apparent distress alert and oriented x3 mood and affect normal, dressed appropriately.  HEENT: Pupils equal, extraocular movements intact  Respiratory: Patient's speak in full sentences and does not appear short of breath  Cardiovascular: No lower extremity edema, non tender, no erythema  Low back does have some loss of lordosis noted.  Some tenderness to palpation in the paraspinal musculature.  Patient  does have tightness with FABER test.  Neck exam does have some limited sidebending bilaterally.  5 out of 5 strength of the all extremities.  Osteopathic findings  C2 flexed rotated and side bent right C6 flexed rotated and side bent left T3 extended rotated and side bent right inhaled rib T6 extended rotated and side bent left L3 flexed rotated and side bent left Sacrum right on right       Assessment and Plan:  SI (sacroiliac) joint dysfunction Sacroiliac dysfunction.  Discussed posture and dynamics, which activities to do and which ones to avoid.  Discussed increasing activity, discussed which activities would be beneficial in which ones would be more harmful.  Increase activity slowly.  Follow-up again in 6 to 8 weeks    Nonallopathic problems  Decision today to treat with OMT was based on Physical Exam  After verbal consent patient was treated with HVLA, ME, FPR techniques in cervical, rib, thoracic, lumbar, and sacral  areas  Patient tolerated the procedure well with improvement in symptoms  Patient given exercises, stretches and lifestyle modifications  See medications in patient instructions if given  Patient will follow up in 4-8 weeks     The above documentation has been reviewed and is accurate and complete Judi Saa, DO         Note: This dictation was prepared with Dragon dictation along with smaller phrase technology. Any transcriptional errors that result from this process are unintentional.

## 2023-11-02 ENCOUNTER — Encounter: Payer: Self-pay | Admitting: Family Medicine

## 2023-11-02 ENCOUNTER — Ambulatory Visit: Payer: Managed Care, Other (non HMO) | Admitting: Family Medicine

## 2023-11-02 VITALS — BP 118/84 | HR 102 | Ht 64.0 in | Wt 169.0 lb

## 2023-11-02 DIAGNOSIS — M9902 Segmental and somatic dysfunction of thoracic region: Secondary | ICD-10-CM

## 2023-11-02 DIAGNOSIS — M9903 Segmental and somatic dysfunction of lumbar region: Secondary | ICD-10-CM

## 2023-11-02 DIAGNOSIS — M9901 Segmental and somatic dysfunction of cervical region: Secondary | ICD-10-CM

## 2023-11-02 DIAGNOSIS — M533 Sacrococcygeal disorders, not elsewhere classified: Secondary | ICD-10-CM | POA: Diagnosis not present

## 2023-11-02 DIAGNOSIS — M9908 Segmental and somatic dysfunction of rib cage: Secondary | ICD-10-CM | POA: Diagnosis not present

## 2023-11-02 DIAGNOSIS — M9904 Segmental and somatic dysfunction of sacral region: Secondary | ICD-10-CM | POA: Diagnosis not present

## 2023-11-02 NOTE — Patient Instructions (Signed)
Good to see you   

## 2023-11-02 NOTE — Assessment & Plan Note (Signed)
Sacroiliac dysfunction.  Discussed posture and dynamics, which activities to do and which ones to avoid.  Discussed increasing activity, discussed which activities would be beneficial in which ones would be more harmful.  Increase activity slowly.  Follow-up again in 6 to 8 weeks

## 2023-12-06 NOTE — Progress Notes (Unsigned)
[name] [address] [place] [address] Phone: [phone] Subjective:   Bruce Donath, am serving as a scribe for Dr. Antoine Primas.  I'm seeing this patient by the request  of:  Bedsole, Amy E, MD  CC: back and neck pain follow up   [id number]  Crystal [name] is a 42 y.o. female coming in with complaint of back and neck pain. OMT on 11/02/2023. Patient states that she continues to have flare ups on R side of lumbar spine.   Medications patient has been prescribed:   Taking:         Reviewed prior external information including notes and imaging from previsou exam, outside providers and external EMR if available.   As well as notes that were available from care everywhere and other healthcare systems.  Past medical history, social, surgical and family history all reviewed in electronic medical record.  No pertanent information unless stated regarding to the chief complaint.   Past Medical History:  Diagnosis Date   Allergy    Asthma     Allergies  Allergen Reactions   Dextromethorphan Hbr Rash     Review of Systems:  No headache, visual changes, nausea, vomiting, diarrhea, constipation, dizziness,  skin rash, fevers, chills, night sweats, weight loss, swollen lymph nodes,  joint swelling, chest pain, shortness of breath, mood changes. POSITIVE muscle aches, abdominal pain, abnormal menses  Objective  Blood pressure 112/88, pulse 67, height 5\' 4"  (1.626 m), weight 169 lb (76.7 kg), SpO2 98%, unknown if currently breastfeeding.   General: No apparent distress alert and oriented x3 mood and affect normal, dressed appropriately.  HEENT: Pupils equal, extraocular movements intact  Respiratory: Patient's speak in full sentences and does not appear short of breath  Cardiovascular: No lower extremity edema, non tender, no erythema  MSK:  Back does have some loss of lordosis noted.  Patient does have worsening abdominal pain.   Patient states uncomfortable diffusely. Patient is a lower pelvic pain and seems to be out of proportion even to light sensation.  No rigidity though.  No masses appreciated.  Low back though does look once again have worsening tightness noted.  Patient appears to be more uncomfortable.  Osteopathic findings  C7 flexed rotated and side bent left T3 extended rotated and side bent right inhaled rib T9 extended rotated and side bent left L2 flexed rotated and side bent right L5 flexed rotated and side bent left Sacrum right on right     Assessment and Plan:  Pelvic pain Patient is having pelvic pain is out of proportion at this time.  Worsening back pain as well that is somewhat concerning.  Patient has a past medical history significant for melanoma that was on the lower extremity.  Patient is having significant number of abnormal Pap smears.  Pain is starting to affect daily activities.  Feel at this point MRI of the pelvis with and without contrast would be beneficial.  Depending on findings this can change treatment options.  Patient has had hemangiomas also noted previously as well as potentially ovarian cyst.  Has had an abnormal mammogram as well.  Follow-up with me again after imaging to discuss further.  SI (sacroiliac) joint dysfunction Patient was still having musculoskeletal complaint as well.  Attempted osteopathic manipulation today after evaluation.  Discussed which activities to do and which ones to avoid.  Increase activity slowly.  Follow-up again in 6 to 8 weeks.    Nonallopathic problems  Decision today to treat with OMT was based on Physical Exam  After verbal consent patient was treated with HVLA, ME, FPR techniques in cervical, rib, thoracic, lumbar, and sacral  areas  Patient tolerated the procedure well with improvement in symptoms  Patient given exercises, stretches and lifestyle modifications  See medications in patient instructions if given  Patient will  follow up in 4-8 weeks    The above documentation has been reviewed and is accurate and complete Judi Saa, DO          Note: This dictation was prepared with Dragon dictation along with smaller phrase technology. Any transcriptional errors that result from this process are unintentional.

## 2023-12-07 ENCOUNTER — Encounter: Payer: Self-pay | Admitting: Family Medicine

## 2023-12-07 ENCOUNTER — Ambulatory Visit: Payer: Managed Care, Other (non HMO) | Admitting: Family Medicine

## 2023-12-07 VITALS — BP 112/88 | HR 67 | Ht 64.0 in | Wt 169.0 lb

## 2023-12-07 DIAGNOSIS — M533 Sacrococcygeal disorders, not elsewhere classified: Secondary | ICD-10-CM | POA: Diagnosis not present

## 2023-12-07 DIAGNOSIS — M9908 Segmental and somatic dysfunction of rib cage: Secondary | ICD-10-CM | POA: Diagnosis not present

## 2023-12-07 DIAGNOSIS — M9904 Segmental and somatic dysfunction of sacral region: Secondary | ICD-10-CM | POA: Diagnosis not present

## 2023-12-07 DIAGNOSIS — R102 Pelvic and perineal pain: Secondary | ICD-10-CM | POA: Diagnosis not present

## 2023-12-07 DIAGNOSIS — M25551 Pain in right hip: Secondary | ICD-10-CM | POA: Diagnosis not present

## 2023-12-07 DIAGNOSIS — M9901 Segmental and somatic dysfunction of cervical region: Secondary | ICD-10-CM | POA: Diagnosis not present

## 2023-12-07 DIAGNOSIS — M9903 Segmental and somatic dysfunction of lumbar region: Secondary | ICD-10-CM

## 2023-12-07 DIAGNOSIS — M9902 Segmental and somatic dysfunction of thoracic region: Secondary | ICD-10-CM

## 2023-12-07 DIAGNOSIS — M25552 Pain in left hip: Secondary | ICD-10-CM

## 2023-12-07 NOTE — Assessment & Plan Note (Signed)
Patient is having pelvic pain is out of proportion at this time.  Worsening back pain as well that is somewhat concerning.  Patient has a past medical history significant for melanoma that was on the lower extremity.  Patient is having significant number of abnormal Pap smears.  Pain is starting to affect daily activities.  Feel at this point MRI of the pelvis with and without contrast would be beneficial.  Depending on findings this can change treatment options.  Patient has had hemangiomas also noted previously as well as potentially ovarian cyst.  Has had an abnormal mammogram as well.  Follow-up with me again after imaging to discuss further.

## 2023-12-07 NOTE — Assessment & Plan Note (Signed)
Patient was still having musculoskeletal complaint as well.  Attempted osteopathic manipulation today after evaluation.  Discussed which activities to do and which ones to avoid.  Increase activity slowly.  Follow-up again in 6 to 8 weeks.

## 2023-12-07 NOTE — Patient Instructions (Signed)
MRI pelvis w and wo

## 2023-12-13 ENCOUNTER — Encounter: Payer: Self-pay | Admitting: Family Medicine

## 2023-12-14 ENCOUNTER — Telehealth: Payer: Self-pay

## 2023-12-14 NOTE — Telephone Encounter (Signed)
Received a fax stating that the MRI of the pelvis With and without is denied. The reasoning's stated below:  ? We need to see results of an x-ray taken after your symptoms started or changed that support further imaging. The notes sent to [place] do not describe these x-ray results.  ? Two sets of pictures (without a dye and then with a dye) are not needed to show your doctor the details needed to treat you. One picture study taken without a dye is sufficient.

## 2023-12-22 ENCOUNTER — Other Ambulatory Visit: Payer: Self-pay

## 2023-12-22 DIAGNOSIS — R102 Pelvic and perineal pain: Secondary | ICD-10-CM

## 2023-12-22 NOTE — Telephone Encounter (Signed)
I do not have any x rays of pelvis  to be able to get this MRI approved.

## 2024-01-09 IMAGING — MR MR LUMBAR SPINE W/O CM
4 of 5 series · 25 of 48 positions shown · non-contrast
Comparison: X-ray 01/15/2021.

CLINICAL DATA: Lumbar spondyloarthropathy. Low back pain radiating
into right leg with tingling and spasms. Instability.

EXAM:
MRI LUMBAR SPINE WITHOUT CONTRAST
TECHNIQUE: Multiplanar, multisequence MR imaging of the lumbar spine was
performed. No intravenous contrast was administered.

[Series 2: T2 · sagittal · 4.0mm · 0.53mm/px · 6 of 14 slices shown (1 of 2)]
[im 1/14]
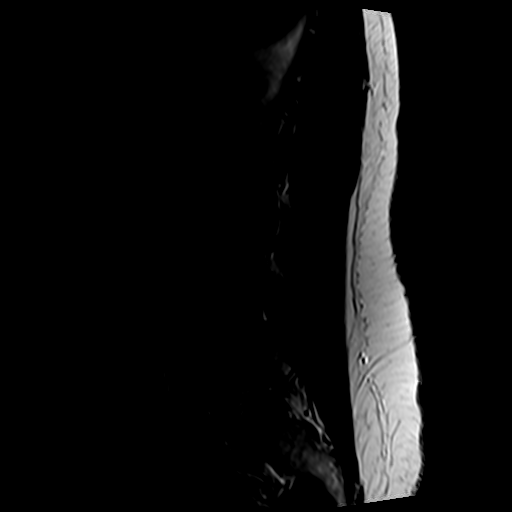
[im 3/14]
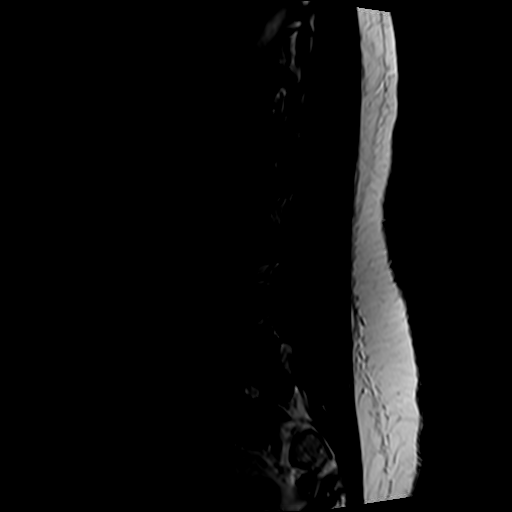
[im 6/14]
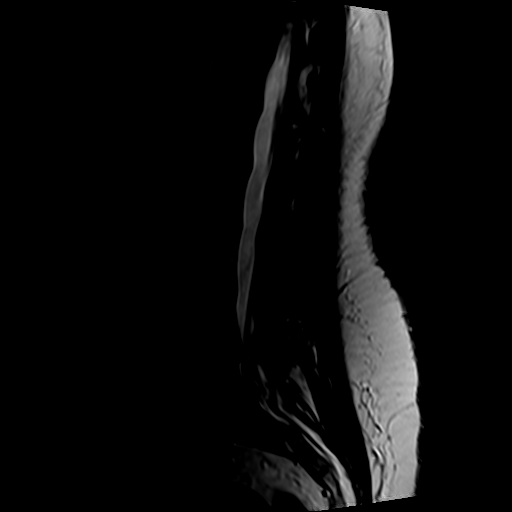
[im 8/14]
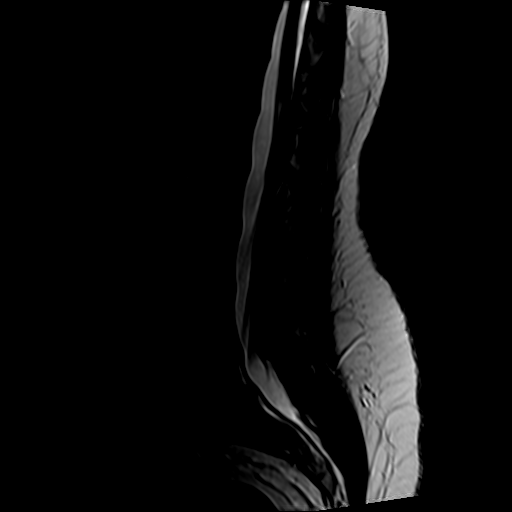
[im 11/14]
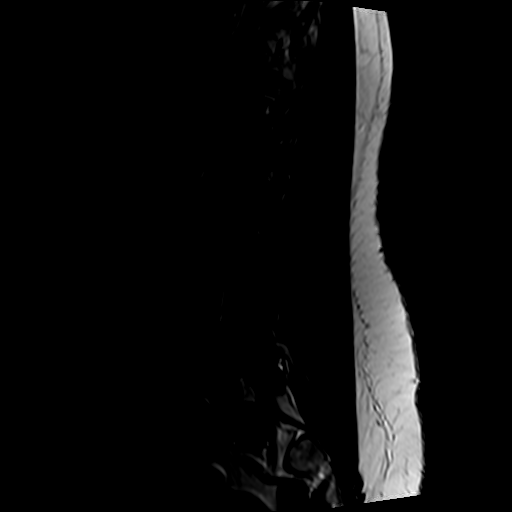
[im 14/14]
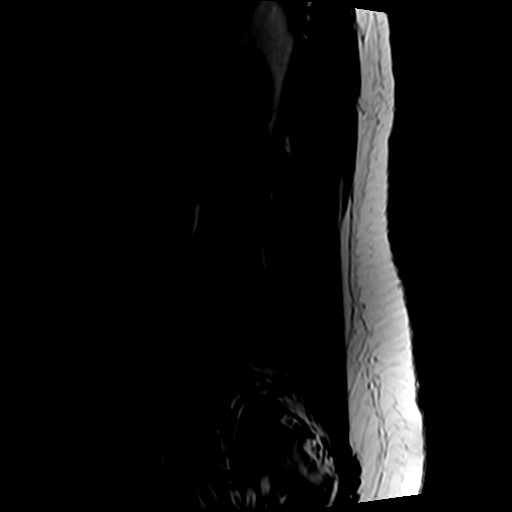

[Series 4: T1 · sagittal · 4.0mm · 0.53mm/px · 6 of 15 slices shown (1 of 2)]
[im 1/15]
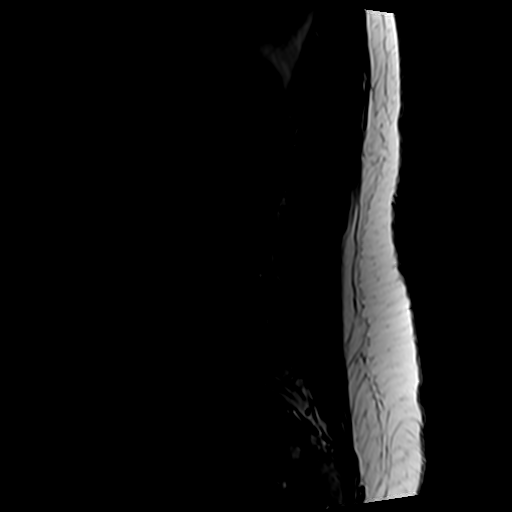
[im 3/15]
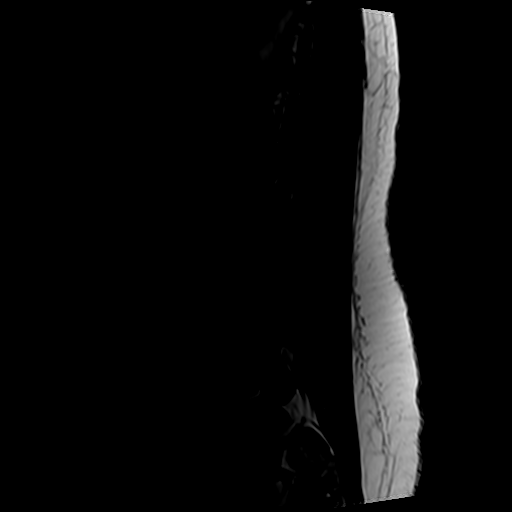
[im 6/15]
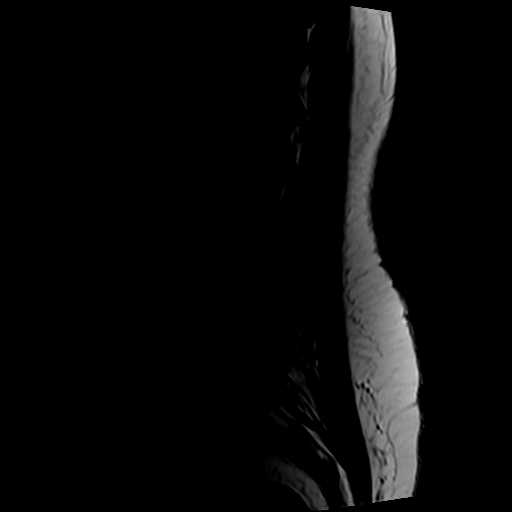
[im 9/15]
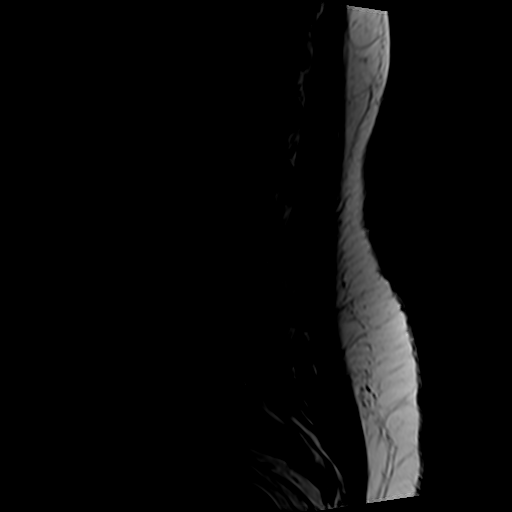
[im 12/15]
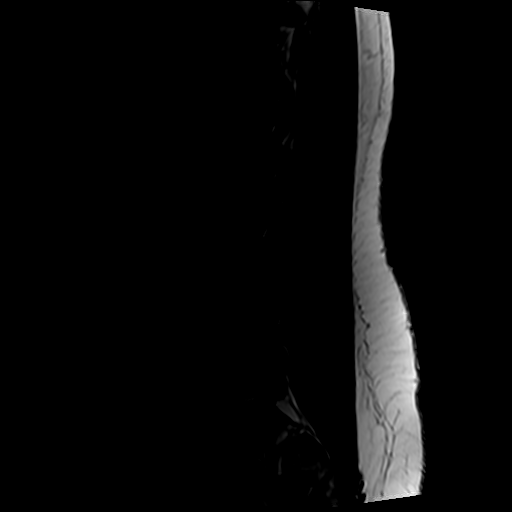
[im 15/15]
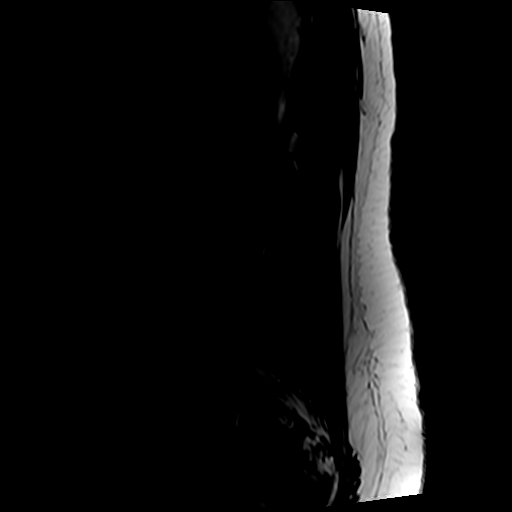

[Series 5: T2 · axial · 4.0mm · 0.70mm/px · z∈[-121,+88]mm · 9 of 37 slices shown (2 of 2)]
[im 1/37]
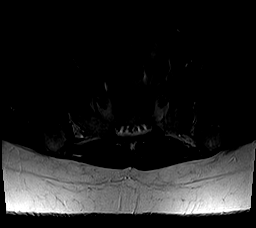
[im 6/37]
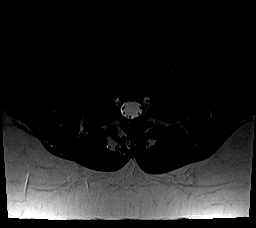
[im 11/37]
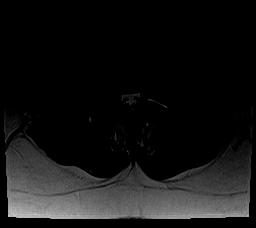
[im 16/37]
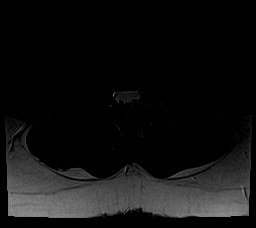
[im 19/37]
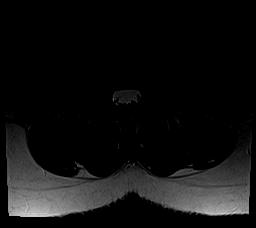
[im 21/37]
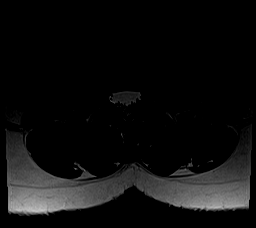
[im 26/37]
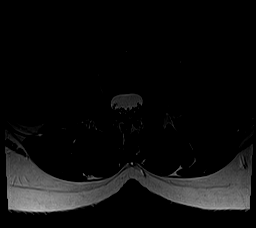
[im 31/37]
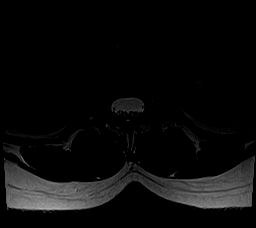
[im 37/37]
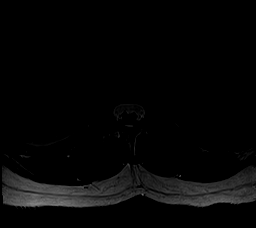

[Series 6: T1 · axial · 4.0mm · 0.35mm/px · z∈[-121,+57]mm · 4 of 37 slices shown (2 of 2)]
[im 1/37]
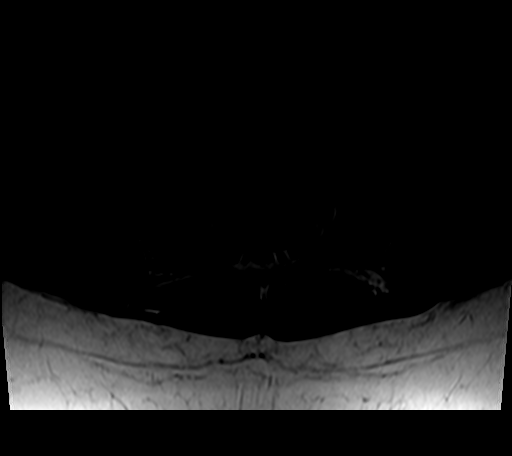
[im 6/37]
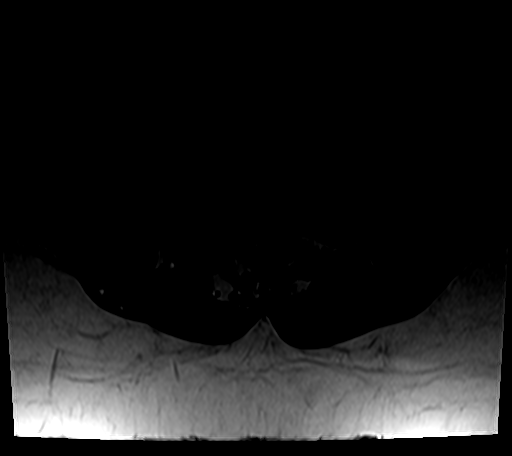
[im 19/37]
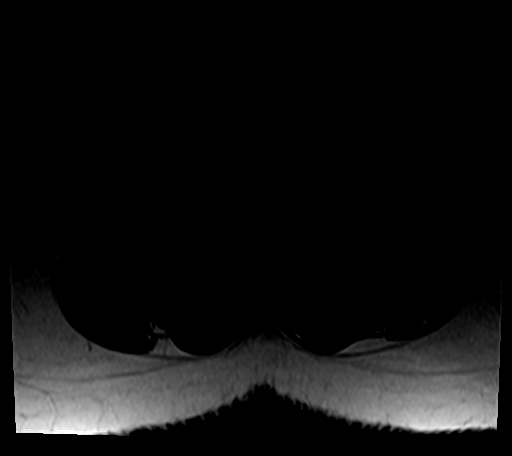
[im 31/37]
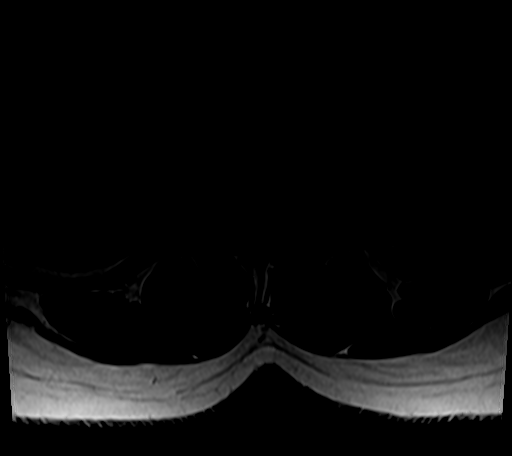

[25 of 48 positions shown; findings below may reference images not displayed]

FINDINGS: Segmentation: Standard; the lowest formed disc space is designated
L5-S1.

Alignment:  Normal.

Vertebrae: Vertebral body heights are preserved. Marrow signal is
normal. There is no suspicious marrow signal abnormality. There is
no marrow edema.

Conus medullaris and cauda equina: Conus extends to the T12-L1
level. Conus and cauda equina appear normal.

Paraspinal and other soft tissues: There is mild perifacetal soft
tissue edema on the left at L4-L5. The paraspinal soft tissues are
otherwise unremarkable.

Disc levels:

There is disc desiccation without significant loss of height at
L4-L5, and disc desiccation and narrowing at L5-S1. The other disc
spaces are preserved.

T12-L1: No significant spinal canal or neural foraminal stenosis

L1-L2: No significant spinal canal or neural foraminal stenosis

L2-L3: No significant spinal canal or neural foraminal stenosis

L4-L5: Minimal facet arthropathy without significant spinal canal or
neural foraminal stenosis.

L4-L5: There is a mild disc bulge and central annular fissure and
left worse than right facet arthropathy resulting in mild narrowing
of the bilateral subarticular zones without frank nerve root
impingement, and mild bilateral neural foraminal stenosis. There is
a small left facet joint effusion with associated perifacetal soft
tissue edema.

L5-S1: There is a mild disc bulge and mild bilateral facet
arthropathy without significant spinal canal or neural foraminal
stenosis.
IMPRESSION: 1. Mild disc bulge and bilateral facet arthropathy at L4-L5 with
narrowing of the subarticular zones without evidence of frank nerve
root impingement, and mild bilateral neural foraminal stenosis.
Additionally, there is a small left facet joint effusion with
associated perifacetal soft tissue edema, which may reflect a source
of pain.
2. Mild degenerative disc disease at L5-S1 without significant
spinal canal or neural foraminal stenosis.
3. Otherwise normal MRI of the lumbar spine.

## 2024-01-19 NOTE — Progress Notes (Unsigned)
  [name] [address] [place] [address] Phone: [phone] Subjective:   [name], am serving as a scribe for Dr. Antoine Primas.  I'm seeing this patient by the request  of:  Bedsole, Amy E, MD  CC: Back pain and pelvic pain  [id number]  Crystal [name] is a 43 y.o. female coming in with complaint of back and neck pain. OMT on 12/07/2023.  Patient was having more pelvic pain that seems to be out of proportion.  Patient states vit d refilled. Same per usual. No new concerns.  Medications patient has been prescribed:   Taking:      Reviewed prior external information including notes and imaging from previsou exam, outside providers and external EMR if available.   As well as notes that were available from care everywhere and other healthcare systems.  Past medical history, social, surgical and family history all reviewed in electronic medical record.  No pertanent information unless stated regarding to the chief complaint.   Past Medical History:  Diagnosis Date   Allergy    Asthma     Allergies  Allergen Reactions   Dextromethorphan Hbr Rash     Review of Systems:  No headache, visual changes, nausea, vomiting, diarrhea, constipation, dizziness, abdominal pain, skin rash, fevers, chills, night sweats, weight loss, swollen lymph nodes, body aches, joint swelling, chest pain, shortness of breath, mood changes. POSITIVE muscle aches  Objective  Blood pressure (!) 128/90, pulse (!) 108, height 5\' 4"  (1.626 m), weight 171 lb (77.6 kg), SpO2 98%, unknown if currently breastfeeding.   General: No apparent distress alert and oriented x3 mood and affect normal, dressed appropriately.  HEENT: Pupils equal, extraocular movements intact  Respiratory: Patient's speak in full sentences and does not appear short of breath  Cardiovascular: No lower extremity edema, non tender, no erythema  MSK:  Back does have some loss of lordosis.   Does have some loss of lordosis.  Tightness noted in the neck especially in the parascapular area right greater than left.  Osteopathic findings  C3 flexed rotated and side bent right C7 flexed rotated and side bent left T3 extended rotated and side bent right inhaled rib T9 extended rotated and side bent left L1 flexed rotated and side bent right L3 flexed rotated and side bent left Sacrum right on right     Assessment and Plan:  SI (sacroiliac) joint dysfunction Tightness noted.  Discussed icing regimen and home exercises, discussed which activities to do and which ones to avoid.  No true radicular symptoms.  Increase activity slowly otherwise.  Follow-up again in 6 to 8 weeks.    Nonallopathic problems  Decision today to treat with OMT was based on Physical Exam  After verbal consent patient was treated with HVLA, ME, FPR techniques in cervical, rib, thoracic, lumbar, and sacral  areas  Patient tolerated the procedure well with improvement in symptoms  Patient given exercises, stretches and lifestyle modifications  See medications in patient instructions if given  Patient will follow up in 4-8 weeks    The above documentation has been reviewed and is accurate and complete Judi Saa, DO          Note: This dictation was prepared with Dragon dictation along with smaller phrase technology. Any transcriptional errors that result from this process are unintentional.

## 2024-01-23 ENCOUNTER — Encounter: Payer: Self-pay | Admitting: Family Medicine

## 2024-01-23 ENCOUNTER — Ambulatory Visit (INDEPENDENT_AMBULATORY_CARE_PROVIDER_SITE_OTHER): Payer: Managed Care, Other (non HMO) | Admitting: Family Medicine

## 2024-01-23 VITALS — BP 128/90 | HR 108 | Ht 64.0 in | Wt 171.0 lb

## 2024-01-23 DIAGNOSIS — M9903 Segmental and somatic dysfunction of lumbar region: Secondary | ICD-10-CM

## 2024-01-23 DIAGNOSIS — M533 Sacrococcygeal disorders, not elsewhere classified: Secondary | ICD-10-CM | POA: Diagnosis not present

## 2024-01-23 DIAGNOSIS — M9902 Segmental and somatic dysfunction of thoracic region: Secondary | ICD-10-CM | POA: Diagnosis not present

## 2024-01-23 DIAGNOSIS — M9904 Segmental and somatic dysfunction of sacral region: Secondary | ICD-10-CM | POA: Diagnosis not present

## 2024-01-23 DIAGNOSIS — M9908 Segmental and somatic dysfunction of rib cage: Secondary | ICD-10-CM

## 2024-01-23 DIAGNOSIS — M9901 Segmental and somatic dysfunction of cervical region: Secondary | ICD-10-CM | POA: Diagnosis not present

## 2024-01-23 NOTE — Assessment & Plan Note (Signed)
Tightness noted.  Discussed icing regimen and home exercises, discussed which activities to do and which ones to avoid.  No true radicular symptoms.  Increase activity slowly otherwise.  Follow-up again in 6 to 8 weeks.

## 2024-01-23 NOTE — Patient Instructions (Signed)
Great to see you Keep working on posture See me in 7-8 weeks

## 2024-02-05 ENCOUNTER — Other Ambulatory Visit: Payer: Self-pay

## 2024-02-05 ENCOUNTER — Ambulatory Visit (INDEPENDENT_AMBULATORY_CARE_PROVIDER_SITE_OTHER): Payer: Managed Care, Other (non HMO)

## 2024-02-05 DIAGNOSIS — R102 Pelvic and perineal pain: Secondary | ICD-10-CM

## 2024-02-05 MED ORDER — VITAMIN D (ERGOCALCIFEROL) 1.25 MG (50000 UNIT) PO CAPS
50000.0000 [IU] | ORAL_CAPSULE | ORAL | 0 refills | Status: DC
Start: 2024-02-05 — End: 2024-06-03

## 2024-02-16 ENCOUNTER — Encounter: Payer: Self-pay | Admitting: Family Medicine

## 2024-03-04 NOTE — Progress Notes (Deleted)
  [name] [address] [place] [address] Phone: [phone] Subjective:    I'm seeing this patient by the request  of:  Bedsole, Amy E, MD  CC: Back and neck pain follow-up  [id number]  Crystal [name] is a 43 y.o. female coming in with complaint of back and neck pain. OMT on 01/23/2024. Patient states   Medications patient has been prescribed:   Taking:         Reviewed prior external information including notes and imaging from previsou exam, outside providers and external EMR if available.   As well as notes that were available from care everywhere and other healthcare systems.  Past medical history, social, surgical and family history all reviewed in electronic medical record.  No pertanent information unless stated regarding to the chief complaint.   Past Medical History:  Diagnosis Date   Allergy    Asthma     Allergies  Allergen Reactions   Dextromethorphan Hbr Rash     Review of Systems:  No headache, visual changes, nausea, vomiting, diarrhea, constipation, dizziness, abdominal pain, skin rash, fevers, chills, night sweats, weight loss, swollen lymph nodes, body aches, joint swelling, chest pain, shortness of breath, mood changes. POSITIVE muscle aches  Objective  Last menstrual period 01/09/2024, unknown if currently breastfeeding.   General: No apparent distress alert and oriented x3 mood and affect normal, dressed appropriately.  HEENT: Pupils equal, extraocular movements intact  Respiratory: Patient's speak in full sentences and does not appear short of breath  Cardiovascular: No lower extremity edema, non tender, no erythema  Gait MSK:  Back   Osteopathic findings  C2 flexed rotated and side bent right C6 flexed rotated and side bent left T3 extended rotated and side bent right inhaled rib T9 extended rotated and side bent left L2 flexed rotated and side bent right Sacrum right on right        Assessment and Plan:  No problem-specific Assessment & Plan notes found for this encounter.    Nonallopathic problems  Decision today to treat with OMT was based on Physical Exam  After verbal consent patient was treated with HVLA, ME, FPR techniques in cervical, rib, thoracic, lumbar, and sacral  areas  Patient tolerated the procedure well with improvement in symptoms  Patient given exercises, stretches and lifestyle modifications  See medications in patient instructions if given  Patient will follow up in 4-8 weeks    The above documentation has been reviewed and is accurate and complete Judi Saa, DO          Note: This dictation was prepared with Dragon dictation along with smaller phrase technology. Any transcriptional errors that result from this process are unintentional.

## 2024-03-05 ENCOUNTER — Ambulatory Visit: Payer: Managed Care, Other (non HMO) | Admitting: Family Medicine

## 2024-03-14 NOTE — Progress Notes (Deleted)
  [name] [address] [place] [address] Phone: [phone] Subjective:    I'm seeing this patient by the request  of:  Excell Seltzer, MD  CC:   [id number]  Crystal [name] is a 43 y.o. female coming in with complaint of back and neck pain. OMT 01/23/2024. Patient states   Medications patient has been prescribed: Vit D  Taking:         Reviewed prior external information including notes and imaging from previsou exam, outside providers and external EMR if available.   As well as notes that were available from care everywhere and other healthcare systems.  Past medical history, social, surgical and family history all reviewed in electronic medical record.  No pertanent information unless stated regarding to the chief complaint.   Past Medical History:  Diagnosis Date   Allergy    Asthma     Allergies  Allergen Reactions   Dextromethorphan Hbr Rash     Review of Systems:  No headache, visual changes, nausea, vomiting, diarrhea, constipation, dizziness, abdominal pain, skin rash, fevers, chills, night sweats, weight loss, swollen lymph nodes, body aches, joint swelling, chest pain, shortness of breath, mood changes. POSITIVE muscle aches  Objective  unknown if currently breastfeeding.   General: No apparent distress alert and oriented x3 mood and affect normal, dressed appropriately.  HEENT: Pupils equal, extraocular movements intact  Respiratory: Patient's speak in full sentences and does not appear short of breath  Cardiovascular: No lower extremity edema, non tender, no erythema  Gait MSK:  Back   Osteopathic findings  C2 flexed rotated and side bent right C6 flexed rotated and side bent left T3 extended rotated and side bent right inhaled rib T9 extended rotated and side bent left L2 flexed rotated and side bent right Sacrum right on right     Assessment and Plan:  No problem-specific Assessment & Plan  notes found for this encounter.    Nonallopathic problems  Decision today to treat with OMT was based on Physical Exam  After verbal consent patient was treated with HVLA, ME, FPR techniques in cervical, rib, thoracic, lumbar, and sacral  areas  Patient tolerated the procedure well with improvement in symptoms  Patient given exercises, stretches and lifestyle modifications  See medications in patient instructions if given  Patient will follow up in 4-8 weeks    The above documentation has been reviewed and is accurate and complete Judi Saa, DO          Note: This dictation was prepared with Dragon dictation along with smaller phrase technology. Any transcriptional errors that result from this process are unintentional.

## 2024-03-18 ENCOUNTER — Ambulatory Visit: Payer: Managed Care, Other (non HMO)

## 2024-03-18 ENCOUNTER — Ambulatory Visit
Admission: RE | Admit: 2024-03-18 | Discharge: 2024-03-18 | Disposition: A | Discharge status: Home / Self Care | Payer: Managed Care, Other (non HMO) | Source: Ambulatory Visit | Attending: Obstetrics and Gynecology | Admitting: Obstetrics and Gynecology

## 2024-03-18 ENCOUNTER — Ambulatory Visit: Admitting: Family Medicine

## 2024-03-18 DIAGNOSIS — R928 Other abnormal and inconclusive findings on diagnostic imaging of breast: Secondary | ICD-10-CM

## 2024-03-21 ENCOUNTER — Other Ambulatory Visit: Payer: Self-pay | Admitting: Family Medicine

## 2024-05-29 NOTE — Progress Notes (Signed)
  [facility] [address] [place] [address] Phone: [phone] Subjective:   IBryan Caprio, am serving as a scribe for Dr. Ronnell Coins.  I'm seeing this patient by the request  of:  Bedsole, Amy E, MD  CC: neck and back pain  [id number]  Crystal [name] is a 43 y.o. female coming in with complaint of back and neck pain. OMT on 01/23/2024. Patient states neck and shoulders today. Wants to leave low back alone.  Medications patient has been prescribed: Effexor , Vit D  Taking:         Reviewed prior external information including notes and imaging from previsou exam, outside providers and external EMR if available.   As well as notes that were available from care everywhere and other healthcare systems.  Past medical history, social, surgical and family history all reviewed in electronic medical record.  No pertanent information unless stated regarding to the chief complaint.   Past Medical History:  Diagnosis Date   Allergy    Asthma     Allergies  Allergen Reactions   Dextromethorphan Hbr Rash     Review of Systems:  No headache, visual changes, nausea, vomiting, diarrhea, constipation, dizziness, abdominal pain, skin rash, fevers, chills, night sweats, weight loss, swollen lymph nodes, body aches, joint swelling, chest pain, shortness of breath, mood changes. POSITIVE muscle aches  Objective  Blood pressure (!) 130/90, pulse (!) 114, height 5\' 4"  (1.626 m), weight 171 lb (77.6 kg), SpO2 98%, unknown if currently breastfeeding.   General: No apparent distress alert and oriented x3 mood and affect normal, dressed appropriately.  HEENT: Pupils equal, extraocular movements intact  Respiratory: Patient's speak in full sentences and does not appear short of breath  Cardiovascular: No lower extremity edema, non tender, no erythema  Deferred back neck does have loss of lordosis, tightness with sidebending as well.   Osteopathic  findings  C2 flexed rotated and side bent right C6 flexed rotated and side bent left T3 extended rotated and side bent right inhaled rib        Assessment and Plan:  Cervical paraspinal muscle spasm Tightness, multifactorial. Discussed Exercises and erogonomics, increase ROM, strengthen upper back, no med change  RTC in 6-8 weeks     Nonallopathic problems  Decision today to treat with OMT was based on Physical Exam  After verbal consent patient was treated with HVLA, ME, FPR techniques in cervical, rib, thoracic  areas  Patient tolerated the procedure well with improvement in symptoms  Patient given exercises, stretches and lifestyle modifications  See medications in patient instructions if given  Patient will follow up in 4-8 weeks             Note: This dictation was prepared with Dragon dictation along with smaller phrase technology. Any transcriptional errors that result from this process are unintentional.

## 2024-05-30 ENCOUNTER — Ambulatory Visit (INDEPENDENT_AMBULATORY_CARE_PROVIDER_SITE_OTHER): Admitting: Family Medicine

## 2024-05-30 VITALS — BP 130/90 | HR 114 | Ht 64.0 in | Wt 171.0 lb

## 2024-05-30 DIAGNOSIS — M9901 Segmental and somatic dysfunction of cervical region: Secondary | ICD-10-CM | POA: Diagnosis not present

## 2024-05-30 DIAGNOSIS — M9908 Segmental and somatic dysfunction of rib cage: Secondary | ICD-10-CM | POA: Diagnosis not present

## 2024-05-30 DIAGNOSIS — M62838 Other muscle spasm: Secondary | ICD-10-CM

## 2024-05-30 DIAGNOSIS — M9902 Segmental and somatic dysfunction of thoracic region: Secondary | ICD-10-CM

## 2024-06-02 ENCOUNTER — Other Ambulatory Visit: Payer: Self-pay | Admitting: Family Medicine

## 2024-06-03 ENCOUNTER — Encounter: Payer: Self-pay | Admitting: Family Medicine

## 2024-06-03 NOTE — Assessment & Plan Note (Signed)
 Tightness, multifactorial. Discussed Exercises and erogonomics, increase ROM, strengthen upper back, no med change  RTC in 6-8 weeks

## 2024-06-09 ENCOUNTER — Encounter: Payer: Self-pay | Admitting: Family Medicine

## 2024-07-09 NOTE — Progress Notes (Unsigned)
  [name] [address] [place] [address] Phone: [phone] Subjective:   Crystal [name] am a scribe for Dr. Claudene.   I'm seeing this patient by the request  of:  Bedsole, Amy E, MD  CC: Back and neck pain  YEP:Dlagzrupcz  Crystal [name] is a 43 y.o. female coming in with complaint of back and neck pain. OMT on 04/30/2024. Patient states been having some neck issues for the past few week on the right side. Mostly low back. Had a few flares ups.   Medications patient has been prescribed: Vit D  Taking:yes         Reviewed prior external information including notes and imaging from previsou exam, outside providers and external EMR if available.   As well as notes that were available from care everywhere and other healthcare systems.  Past medical history, social, surgical and family history all reviewed in electronic medical record.  No pertanent information unless stated regarding to the chief complaint.   Past Medical History:  Diagnosis Date   Allergy    Asthma     Allergies  Allergen Reactions   Dextromethorphan Hbr Rash     Review of Systems:  No headache, visual changes, nausea, vomiting, diarrhea, constipation, dizziness, abdominal pain, skin rash, fevers, chills, night sweats, weight loss, swollen lymph nodes, body aches, joint swelling, chest pain, shortness of breath, mood changes. POSITIVE muscle aches  Objective  Blood pressure 110/70, pulse 95, height 5' 4 (1.626 m), weight 171 lb 6.4 oz (77.7 kg), SpO2 98%, unknown if currently breastfeeding.   General: No apparent distress alert and oriented x3 mood and affect normal, dressed appropriately.  HEENT: Pupils equal, extraocular movements intact  Respiratory: Patient's speak in full sentences and does not appear short of breath does sound congested Cardiovascular: No lower extremity edema, non tender, no erythema  Gait relatively normal. MSK:  Back has had some  loss of lordosis noted.  Severe tenderness noted in the parascapular area.  Seems to be on the right greater than left.  Multiple trigger points noted.  Osteopathic findings  C3 flexed rotated and side bent right C7 flexed rotated and side bent left T3 extended rotated and side bent right inhaled rib T9 extended rotated and side bent left L2 flexed rotated and side bent right L3 flexed rotated and side bent left Sacrum right on right       Assessment and Plan:  Cervical paraspinal muscle spasm Tightness noted.  Discussed icing regimen of home exercises.  Patient recently did have a URI that I think did cause some exacerbation.  Increase activity slowly.  Follow-up again in 6 to 8 weeks.    Nonallopathic problems  Decision today to treat with OMT was based on Physical Exam  After verbal consent patient was treated with HVLA, ME, FPR techniques in cervical, rib, thoracic, lumbar, and sacral  areas  Patient tolerated the procedure well with improvement in symptoms  Patient given exercises, stretches and lifestyle modifications  See medications in patient instructions if given  Patient will follow up in 4-8 weeks             Note: This dictation was prepared with Dragon dictation along with smaller phrase technology. Any transcriptional errors that result from this process are unintentional.

## 2024-07-11 ENCOUNTER — Encounter: Payer: Self-pay | Admitting: Family Medicine

## 2024-07-11 ENCOUNTER — Ambulatory Visit: Admitting: Family Medicine

## 2024-07-11 VITALS — BP 110/70 | HR 95 | Ht 64.0 in | Wt 171.4 lb

## 2024-07-11 DIAGNOSIS — M62838 Other muscle spasm: Secondary | ICD-10-CM

## 2024-07-11 DIAGNOSIS — M9903 Segmental and somatic dysfunction of lumbar region: Secondary | ICD-10-CM | POA: Diagnosis not present

## 2024-07-11 DIAGNOSIS — M9904 Segmental and somatic dysfunction of sacral region: Secondary | ICD-10-CM | POA: Diagnosis not present

## 2024-07-11 DIAGNOSIS — M9908 Segmental and somatic dysfunction of rib cage: Secondary | ICD-10-CM | POA: Diagnosis not present

## 2024-07-11 DIAGNOSIS — M9902 Segmental and somatic dysfunction of thoracic region: Secondary | ICD-10-CM

## 2024-07-11 DIAGNOSIS — M9901 Segmental and somatic dysfunction of cervical region: Secondary | ICD-10-CM

## 2024-07-11 NOTE — Patient Instructions (Addendum)
Good to see you See me again in 2 months 

## 2024-07-11 NOTE — Assessment & Plan Note (Signed)
 Tightness noted.  Discussed icing regimen of home exercises.  Patient recently did have a URI that I think did cause some exacerbation.  Increase activity slowly.  Follow-up again in 6 to 8 weeks.

## 2024-08-25 ENCOUNTER — Other Ambulatory Visit: Payer: Self-pay | Admitting: Family Medicine

## 2024-09-02 NOTE — Progress Notes (Deleted)
  [name] [address] [place] [address] Phone: [phone] Subjective:    I'm seeing this patient by the request  of:  Bedsole, Amy E, MD  CC: Back and neck pain follow-up  YEP:Dlagzrupcz  Crystal [name] is a 43 y.o. female coming in with complaint of back and neck pain. OMT 7/17/20925. Patient states   Medications patient has been prescribed:   Taking:     Probabaly recheck labs     Reviewed prior external information including notes and imaging from previsou exam, outside providers and external EMR if available.   As well as notes that were available from care everywhere and other healthcare systems.  Past medical history, social, surgical and family history all reviewed in electronic medical record.  No pertanent information unless stated regarding to the chief complaint.   Past Medical History:  Diagnosis Date   Allergy    Asthma     Allergies  Allergen Reactions   Dextromethorphan Hbr Rash     Review of Systems:  No headache, visual changes, nausea, vomiting, diarrhea, constipation, dizziness, abdominal pain, skin rash, fevers, chills, night sweats, weight loss, swollen lymph nodes, body aches, joint swelling, chest pain, shortness of breath, mood changes. POSITIVE muscle aches  Objective  unknown if currently breastfeeding.   General: No apparent distress alert and oriented x3 mood and affect normal, dressed appropriately.  HEENT: Pupils equal, extraocular movements intact  Respiratory: Patient's speak in full sentences and does not appear short of breath  Cardiovascular: No lower extremity edema, non tender, no erythema  Gait MSK:  Back   Osteopathic findings  C2 flexed rotated and side bent right C6 flexed rotated and side bent left T3 extended rotated and side bent right inhaled rib T9 extended rotated and side bent left L2 flexed rotated and side bent right Sacrum right on right       Assessment and  Plan:  No problem-specific Assessment & Plan notes found for this encounter.    Nonallopathic problems  Decision today to treat with OMT was based on Physical Exam  After verbal consent patient was treated with HVLA, ME, FPR techniques in cervical, rib, thoracic, lumbar, and sacral  areas  Patient tolerated the procedure well with improvement in symptoms  Patient given exercises, stretches and lifestyle modifications  See medications in patient instructions if given  Patient will follow up in 4-8 weeks    The above documentation has been reviewed and is accurate and complete Lyly Canizales M Candus Braud, DO          Note: This dictation was prepared with Dragon dictation along with smaller phrase technology. Any transcriptional errors that result from this process are unintentional.

## 2024-09-03 ENCOUNTER — Ambulatory Visit: Admitting: Family Medicine

## 2024-09-03 NOTE — Progress Notes (Unsigned)
  [name] [address] [place] [address] Phone: [phone] Subjective:    I'm seeing this patient by the request  of:  Bedsole, Amy E, MD  CC: Back and neck pain follow-up  YEP:Dlagzrupcz  Crystal [name] is a 43 y.o. female coming in with complaint of back and neck pain. OMT 7/17/20925. Patient states   Medications patient has been prescribed:   Taking:     Probabaly recheck labs     Reviewed prior external information including notes and imaging from previsou exam, outside providers and external EMR if available.   As well as notes that were available from care everywhere and other healthcare systems.  Past medical history, social, surgical and family history all reviewed in electronic medical record.  No pertanent information unless stated regarding to the chief complaint.   Past Medical History:  Diagnosis Date   Allergy    Asthma     Allergies  Allergen Reactions   Dextromethorphan Hbr Rash     Review of Systems:  No headache, visual changes, nausea, vomiting, diarrhea, constipation, dizziness, abdominal pain, skin rash, fevers, chills, night sweats, weight loss, swollen lymph nodes, body aches, joint swelling, chest pain, shortness of breath, mood changes. POSITIVE muscle aches  Objective  unknown if currently breastfeeding.   General: No apparent distress alert and oriented x3 mood and affect normal, dressed appropriately.  HEENT: Pupils equal, extraocular movements intact  Respiratory: Patient's speak in full sentences and does not appear short of breath  Cardiovascular: No lower extremity edema, non tender, no erythema  Gait MSK:  Back   Osteopathic findings  C2 flexed rotated and side bent right C6 flexed rotated and side bent left T3 extended rotated and side bent right inhaled rib T9 extended rotated and side bent left L2 flexed rotated and side bent right Sacrum right on right       Assessment and  Plan:  No problem-specific Assessment & Plan notes found for this encounter.    Nonallopathic problems  Decision today to treat with OMT was based on Physical Exam  After verbal consent patient was treated with HVLA, ME, FPR techniques in cervical, rib, thoracic, lumbar, and sacral  areas  Patient tolerated the procedure well with improvement in symptoms  Patient given exercises, stretches and lifestyle modifications  See medications in patient instructions if given  Patient will follow up in 4-8 weeks    The above documentation has been reviewed and is accurate and complete Lyly Canizales M Candus Braud, DO          Note: This dictation was prepared with Dragon dictation along with smaller phrase technology. Any transcriptional errors that result from this process are unintentional.

## 2024-09-04 ENCOUNTER — Ambulatory Visit: Admitting: Family Medicine

## 2024-09-04 ENCOUNTER — Encounter: Payer: Self-pay | Admitting: Family Medicine

## 2024-09-04 VITALS — BP 124/86 | Ht 64.0 in | Wt 174.0 lb

## 2024-09-04 DIAGNOSIS — M9902 Segmental and somatic dysfunction of thoracic region: Secondary | ICD-10-CM

## 2024-09-04 DIAGNOSIS — M9903 Segmental and somatic dysfunction of lumbar region: Secondary | ICD-10-CM | POA: Diagnosis not present

## 2024-09-04 DIAGNOSIS — M533 Sacrococcygeal disorders, not elsewhere classified: Secondary | ICD-10-CM

## 2024-09-04 DIAGNOSIS — M9901 Segmental and somatic dysfunction of cervical region: Secondary | ICD-10-CM | POA: Diagnosis not present

## 2024-09-04 DIAGNOSIS — M9908 Segmental and somatic dysfunction of rib cage: Secondary | ICD-10-CM

## 2024-09-04 DIAGNOSIS — M9904 Segmental and somatic dysfunction of sacral region: Secondary | ICD-10-CM | POA: Diagnosis not present

## 2024-09-04 NOTE — Assessment & Plan Note (Signed)
 Chronic, with exacerbation.  Discussed icing regimen and home exercises, discussed which activities to do and which ones to increase activity slowly.  Discussed which home exercises may be more beneficial.  Follow-up with me again in 6 to 8 weeks.

## 2024-09-04 NOTE — Patient Instructions (Signed)
Good to see you   See you again in

## 2024-10-14 NOTE — Progress Notes (Deleted)
  [name] [address] [place] [address] Phone: [phone] Subjective:    I'm seeing this patient by the request  of:  Avelina Greig BRAVO, MD  CC:   YEP:Dlagzrupcz  Crystal [name] is a 43 y.o. female coming in with complaint of back and neck pain. OMT 09/04/2024. Patient states   Medications patient has been prescribed: Vit D  Taking:         Reviewed prior external information including notes and imaging from previsou exam, outside providers and external EMR if available.   As well as notes that were available from care everywhere and other healthcare systems.  Past medical history, social, surgical and family history all reviewed in electronic medical record.  No pertanent information unless stated regarding to the chief complaint.   Past Medical History:  Diagnosis Date   Allergy    Asthma     Allergies  Allergen Reactions   Dextromethorphan Hbr Rash     Review of Systems:  No headache, visual changes, nausea, vomiting, diarrhea, constipation, dizziness, abdominal pain, skin rash, fevers, chills, night sweats, weight loss, swollen lymph nodes, body aches, joint swelling, chest pain, shortness of breath, mood changes. POSITIVE muscle aches  Objective  unknown if currently breastfeeding.   General: No apparent distress alert and oriented x3 mood and affect normal, dressed appropriately.  HEENT: Pupils equal, extraocular movements intact  Respiratory: Patient's speak in full sentences and does not appear short of breath  Cardiovascular: No lower extremity edema, non tender, no erythema  Gait MSK:  Back   Osteopathic findings  C2 flexed rotated and side bent right C6 flexed rotated and side bent left T3 extended rotated and side bent right inhaled rib T9 extended rotated and side bent left L2 flexed rotated and side bent right Sacrum right on right       Assessment and Plan:  No problem-specific Assessment & Plan  notes found for this encounter.    Nonallopathic problems  Decision today to treat with OMT was based on Physical Exam  After verbal consent patient was treated with HVLA, ME, FPR techniques in cervical, rib, thoracic, lumbar, and sacral  areas  Patient tolerated the procedure well with improvement in symptoms  Patient given exercises, stretches and lifestyle modifications  See medications in patient instructions if given  Patient will follow up in 4-8 weeks             Note: This dictation was prepared with Dragon dictation along with smaller phrase technology. Any transcriptional errors that result from this process are unintentional.

## 2024-10-15 ENCOUNTER — Ambulatory Visit: Admitting: Family Medicine

## 2024-10-24 NOTE — Progress Notes (Signed)
 [name] [address] [place] [address] Phone: [phone] Subjective:    I'm seeing Crystal [name], Crystal [name], am serving as a scribe for Dr. Arthea [name]. his patient by the request  of:  [name], Crystal E, MD  CC: Back and neck pain follow-up  YEP:Dlagzrupcz  Crystal [name] is a 43 y.o. female coming in with complaint of back and neck pain. OMT 09/04/2024. Patient states that she had 2 flare ups since last visit. One of them she was unable to do anything. Also having joint pain through her body.   Thinks she has a cyst in L wrist near navicular. Has to take Bayer daily to alleviate pain in wrist. Swelling waxes and wanes.   Medications patient has been prescribed: Vit D  Taking:         Reviewed prior external information including notes and imaging from previsou exam, outside providers and external EMR if available.   As well as notes that were available from care everywhere and other healthcare systems.  Past medical history, social, surgical and family history all reviewed in electronic medical record.  No pertanent information unless stated regarding to the chief complaint.   Past Medical History:  Diagnosis Date   Allergy    Asthma     Allergies  Allergen Reactions   Dextromethorphan Hbr Rash     Review of Systems:  No headache, visual changes, nausea, vomiting, diarrhea, constipation, dizziness, abdominal pain, skin rash, fevers, chills, night sweats, weight loss, swollen lymph nodes, body aches, joint swelling, chest pain, shortness of breath, mood changes. POSITIVE muscle aches  Objective  unknown if currently breastfeeding. Blood pressure 110/82, height 5' 4 (1.626 m), weight 170 lb (77.1 kg), unknown if currently breastfeeding.    General: No apparent distress alert and oriented x3 mood and affect normal, dressed appropriately.  HEENT: Pupils equal, extraocular movements intact  Respiratory: Patient's speak in full  sentences and does not appear short of breath  Cardiovascular: No lower extremity edema, non tender, no erythema  Gait MSK:  Back does have some mild loss of lordosis.  Tenderness over the right and left sacroiliac joint.  Mild positive FABER test noted.  Patient has tightness in the Spine negative straight leg test noted.  Osteopathic findings  C3 flexed rotated and side bent right C6 flexed rotated and side bent left T3 extended rotated and side bent right inhaled rib T7 extended rotated and side bent left L2 flexed rotated and side bent right Sacrum right on right       Assessment and Plan: SI (sacroiliac) joint dysfunction Sacroiliac dysfunction noted.  Discussed icing regimen and home exercises, discussed core strengthening.  Increase activity slowly.  Discussed ice follow-up again in 6 to 8 weeks otherwise.   No problem-specific Assessment & Plan notes found for this encounter.    Nonallopathic problems  Decision today to treat with OMT was based on Physical Exam  After verbal consent patient was treated with HVLA, ME, FPR techniques in cervical, rib, thoracic, lumbar, and sacral  areas  Patient tolerated the procedure well with improvement in symptoms  Patient given exercises, stretches and lifestyle modifications  See medications in patient instructions if given  Patient will follow up in 4-8 weeks     The above documentation has been reviewed and is accurate and complete Crystal [name] M [name], DO         Note: This dictation was prepared with Dragon dictation along with smaller phrase technology.  Any transcriptional errors that result from this process are unintentional.

## 2024-10-29 ENCOUNTER — Other Ambulatory Visit: Payer: Self-pay

## 2024-10-29 ENCOUNTER — Ambulatory Visit (INDEPENDENT_AMBULATORY_CARE_PROVIDER_SITE_OTHER): Admitting: Family Medicine

## 2024-10-29 ENCOUNTER — Encounter: Payer: Self-pay | Admitting: Family Medicine

## 2024-10-29 VITALS — BP 110/82 | Ht 64.0 in | Wt 170.0 lb

## 2024-10-29 DIAGNOSIS — M67432 Ganglion, left wrist: Secondary | ICD-10-CM | POA: Insufficient documentation

## 2024-10-29 DIAGNOSIS — M533 Sacrococcygeal disorders, not elsewhere classified: Secondary | ICD-10-CM

## 2024-10-29 DIAGNOSIS — M9901 Segmental and somatic dysfunction of cervical region: Secondary | ICD-10-CM | POA: Diagnosis not present

## 2024-10-29 DIAGNOSIS — M9903 Segmental and somatic dysfunction of lumbar region: Secondary | ICD-10-CM | POA: Diagnosis not present

## 2024-10-29 DIAGNOSIS — M25532 Pain in left wrist: Secondary | ICD-10-CM | POA: Diagnosis not present

## 2024-10-29 DIAGNOSIS — M9908 Segmental and somatic dysfunction of rib cage: Secondary | ICD-10-CM | POA: Diagnosis not present

## 2024-10-29 DIAGNOSIS — M9904 Segmental and somatic dysfunction of sacral region: Secondary | ICD-10-CM

## 2024-10-29 DIAGNOSIS — M9902 Segmental and somatic dysfunction of thoracic region: Secondary | ICD-10-CM

## 2024-10-29 NOTE — Assessment & Plan Note (Signed)
 Sacroiliac dysfunction noted.  Discussed icing regimen and home exercises, discussed core strengthening.  Increase activity slowly.  Discussed ice follow-up again in 6 to 8 weeks otherwise.

## 2024-10-29 NOTE — Assessment & Plan Note (Signed)
 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  Will monitor.  Discussed which activities to do and which ones to avoid.  Increase activity slowly.  Discussed icing regimen.  Follow-up again in 6 to 12 weeks otherwise.

## 2024-12-12 NOTE — Progress Notes (Unsigned)
°  [name] [address] [place] [address] Phone: [phone] Subjective:    I'm seeing this patient by the request  of:  Avelina Greig BRAVO, MD  CC:   YEP:[id number]  Crystal [name] is a 43 y.o. female coming in with complaint of back and neck pain. OMT on 10/29/2024. Patient states   Medications patient has been prescribed:   Taking:         Reviewed prior external information including notes and imaging from previsou exam, outside providers and external EMR if available.   As well as notes that were available from care everywhere and other healthcare systems.  Past medical history, social, surgical and family history all reviewed in electronic medical record.  No pertanent information unless stated regarding to the chief complaint.   Past Medical History:  Diagnosis Date   Allergy    Asthma     Allergies[1]   Review of Systems:  No headache, visual changes, nausea, vomiting, diarrhea, constipation, dizziness, abdominal pain, skin rash, fevers, chills, night sweats, weight loss, swollen lymph nodes, body aches, joint swelling, chest pain, shortness of breath, mood changes. POSITIVE muscle aches  Objective  unknown if currently breastfeeding.   General: No apparent distress alert and oriented x3 mood and affect normal, dressed appropriately.  HEENT: Pupils equal, extraocular movements intact  Respiratory: Patient's speak in full sentences and does not appear short of breath  Cardiovascular: No lower extremity edema, non tender, no erythema  Gait MSK:  Back   Osteopathic findings  C2 flexed rotated and side bent right C6 flexed rotated and side bent left T3 extended rotated and side bent right inhaled rib T9 extended rotated and side bent left L2 flexed rotated and side bent right Sacrum right on right       Assessment and Plan:  No problem-specific Assessment & Plan notes found for this encounter.    Nonallopathic  problems  Decision today to treat with OMT was based on Physical Exam  After verbal consent patient was treated with HVLA, ME, FPR techniques in cervical, rib, thoracic, lumbar, and sacral  areas  Patient tolerated the procedure well with improvement in symptoms  Patient given exercises, stretches and lifestyle modifications  See medications in patient instructions if given  Patient will follow up in 4-8 weeks             Note: This dictation was prepared with Dragon dictation along with smaller phrase technology. Any transcriptional errors that result from this process are unintentional.            [1]  Allergies Allergen Reactions   Dextromethorphan Hbr Rash

## 2024-12-17 ENCOUNTER — Encounter: Payer: Self-pay | Admitting: Family Medicine

## 2024-12-17 ENCOUNTER — Ambulatory Visit: Admitting: Family Medicine

## 2024-12-17 VITALS — BP 122/82 | Ht 64.0 in | Wt 168.0 lb

## 2024-12-17 DIAGNOSIS — M9902 Segmental and somatic dysfunction of thoracic region: Secondary | ICD-10-CM

## 2024-12-17 DIAGNOSIS — M9901 Segmental and somatic dysfunction of cervical region: Secondary | ICD-10-CM | POA: Diagnosis not present

## 2024-12-17 DIAGNOSIS — M9908 Segmental and somatic dysfunction of rib cage: Secondary | ICD-10-CM

## 2024-12-17 DIAGNOSIS — M9903 Segmental and somatic dysfunction of lumbar region: Secondary | ICD-10-CM | POA: Diagnosis not present

## 2024-12-17 DIAGNOSIS — M9904 Segmental and somatic dysfunction of sacral region: Secondary | ICD-10-CM

## 2024-12-17 DIAGNOSIS — M533 Sacrococcygeal disorders, not elsewhere classified: Secondary | ICD-10-CM | POA: Diagnosis not present

## 2024-12-17 NOTE — Assessment & Plan Note (Signed)
 Overall stable, chronic problem low.  Will continue to work on her weight loss which patient has done well and has lost 6 pounds in the last 3 months.  Discussed icing regimen and home exercises, discussed core strengthening.  Follow-up again in 6 to 12 weeks

## 2025-02-13 ENCOUNTER — Ambulatory Visit: Admitting: Family Medicine
